# Patient Record
Sex: Male | Born: 1960 | Race: Black or African American | Hispanic: No | Marital: Single | Smoking: Former smoker
Health system: Southern US, Community
[De-identification: ages and names within clinical notes are randomized; demographics above are authoritative.]

## PROBLEM LIST (undated history)

## (undated) DIAGNOSIS — M869 Osteomyelitis, unspecified: Secondary | ICD-10-CM

## (undated) DIAGNOSIS — M7989 Other specified soft tissue disorders: Secondary | ICD-10-CM

## (undated) DIAGNOSIS — I10 Essential (primary) hypertension: Secondary | ICD-10-CM

## (undated) DIAGNOSIS — H539 Unspecified visual disturbance: Secondary | ICD-10-CM

## (undated) DIAGNOSIS — Z5189 Encounter for other specified aftercare: Secondary | ICD-10-CM

## (undated) DIAGNOSIS — E785 Hyperlipidemia, unspecified: Secondary | ICD-10-CM

## (undated) HISTORY — DX: Other specified soft tissue disorders: M79.89

## (undated) HISTORY — PX: EYE SURGERY: SHX253

## (undated) HISTORY — PX: TOE AMPUTATION: SHX809

## (undated) HISTORY — PX: TONSILLECTOMY: SUR1361

## (undated) HISTORY — DX: Unspecified visual disturbance: H53.9

---

## 1979-06-17 HISTORY — PX: NASAL SEPTUM SURGERY: SHX37

## 2003-06-03 ENCOUNTER — Emergency Department (HOSPITAL_COMMUNITY): Admission: EM | Admit: 2003-06-03 | Discharge: 2003-06-04 | Payer: Self-pay | Admitting: Emergency Medicine

## 2004-03-28 ENCOUNTER — Inpatient Hospital Stay (HOSPITAL_COMMUNITY): Admission: EM | Admit: 2004-03-28 | Discharge: 2004-03-31 | Payer: Self-pay | Admitting: Emergency Medicine

## 2004-03-28 ENCOUNTER — Ambulatory Visit: Payer: Self-pay | Admitting: Endocrinology

## 2004-03-29 ENCOUNTER — Encounter: Payer: Self-pay | Admitting: Cardiology

## 2006-11-25 ENCOUNTER — Ambulatory Visit: Payer: Self-pay | Admitting: Family Medicine

## 2006-11-25 ENCOUNTER — Inpatient Hospital Stay (HOSPITAL_COMMUNITY): Admission: EM | Admit: 2006-11-25 | Discharge: 2006-11-28 | Payer: Self-pay | Admitting: Emergency Medicine

## 2006-12-23 ENCOUNTER — Encounter: Payer: Self-pay | Admitting: *Deleted

## 2007-06-17 DIAGNOSIS — Z5189 Encounter for other specified aftercare: Secondary | ICD-10-CM

## 2007-06-17 DIAGNOSIS — IMO0001 Reserved for inherently not codable concepts without codable children: Secondary | ICD-10-CM

## 2007-06-17 HISTORY — DX: Reserved for inherently not codable concepts without codable children: IMO0001

## 2007-06-17 HISTORY — DX: Encounter for other specified aftercare: Z51.89

## 2007-11-15 ENCOUNTER — Inpatient Hospital Stay (HOSPITAL_COMMUNITY): Admission: EM | Admit: 2007-11-15 | Discharge: 2007-12-02 | Payer: Self-pay | Admitting: Emergency Medicine

## 2007-11-15 HISTORY — PX: OTHER SURGICAL HISTORY: SHX169

## 2007-11-16 ENCOUNTER — Encounter (INDEPENDENT_AMBULATORY_CARE_PROVIDER_SITE_OTHER): Payer: Self-pay | Admitting: Orthopedic Surgery

## 2007-11-17 ENCOUNTER — Encounter (INDEPENDENT_AMBULATORY_CARE_PROVIDER_SITE_OTHER): Payer: Self-pay | Admitting: Internal Medicine

## 2007-11-18 ENCOUNTER — Ambulatory Visit: Payer: Self-pay | Admitting: Vascular Surgery

## 2007-11-21 ENCOUNTER — Ambulatory Visit: Payer: Self-pay | Admitting: *Deleted

## 2007-11-22 ENCOUNTER — Ambulatory Visit: Payer: Self-pay | Admitting: Infectious Diseases

## 2007-11-23 ENCOUNTER — Encounter (INDEPENDENT_AMBULATORY_CARE_PROVIDER_SITE_OTHER): Payer: Self-pay | Admitting: Internal Medicine

## 2009-05-31 IMAGING — CR DG ABDOMEN ACUTE W/ 1V CHEST
3 series · 3 of 3 positions shown · non-contrast
Comparison: Chest x-ray of 03/28/04.

CLINICAL DATA: Shortness of breath, vomiting and diarrhea.
 ACUTE ABDOMINAL SERIES WITH CHEST ? 3 VIEW:

[w chest pa]
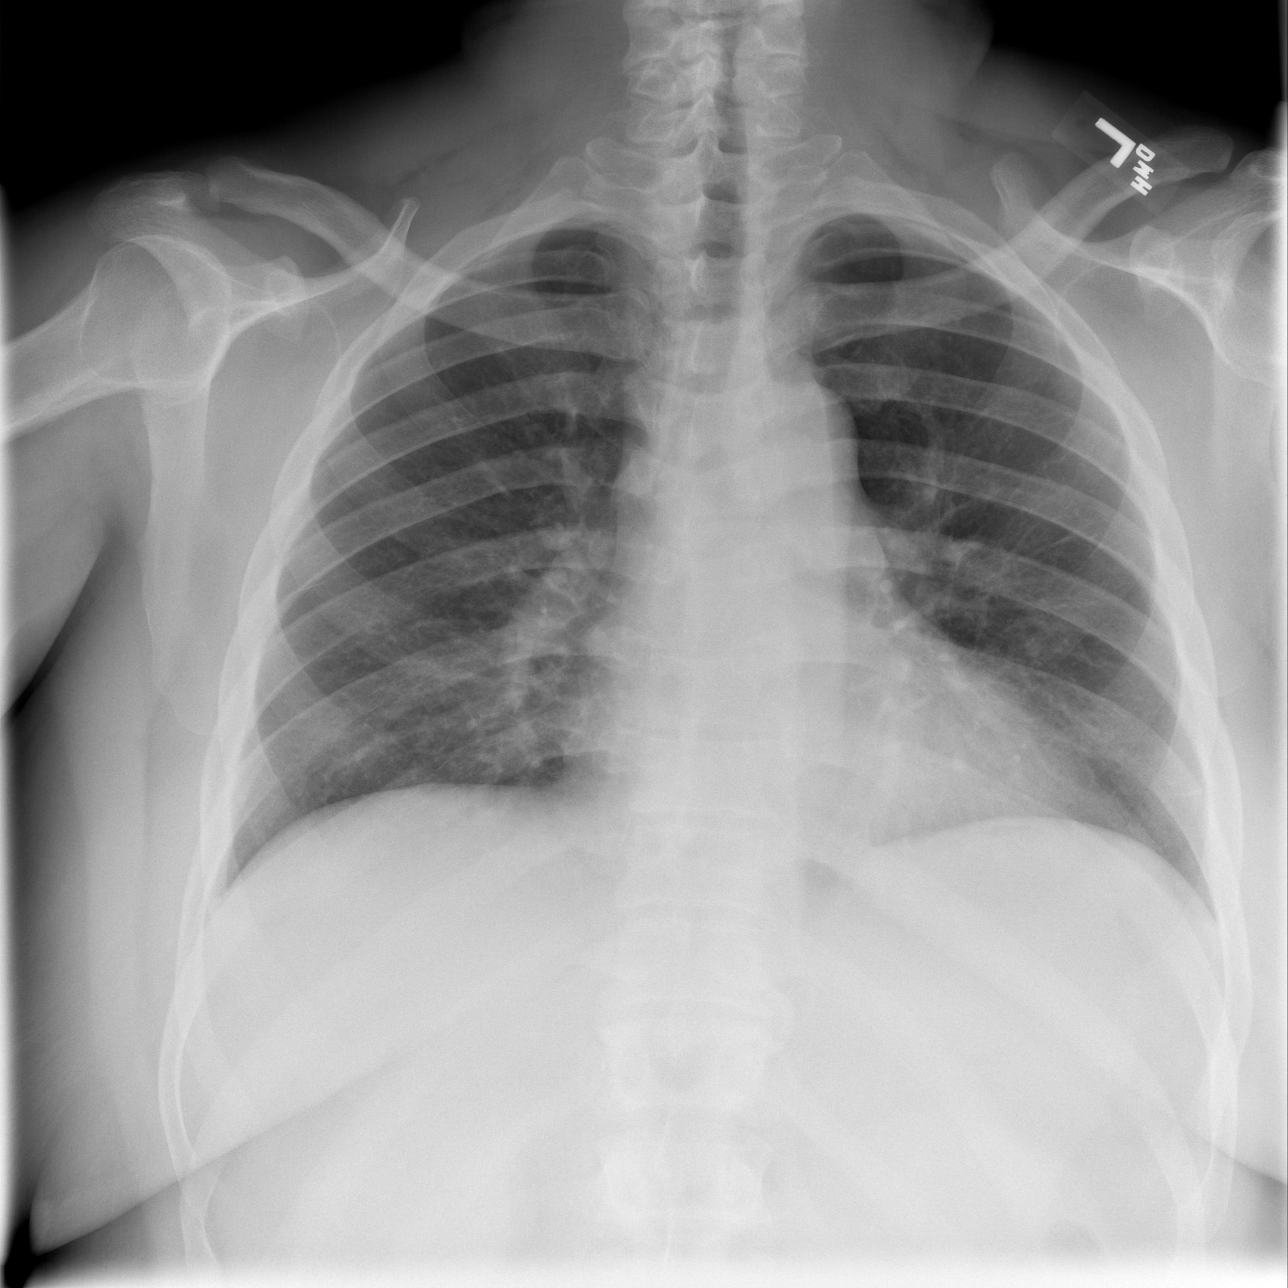

[w abdomen upright]
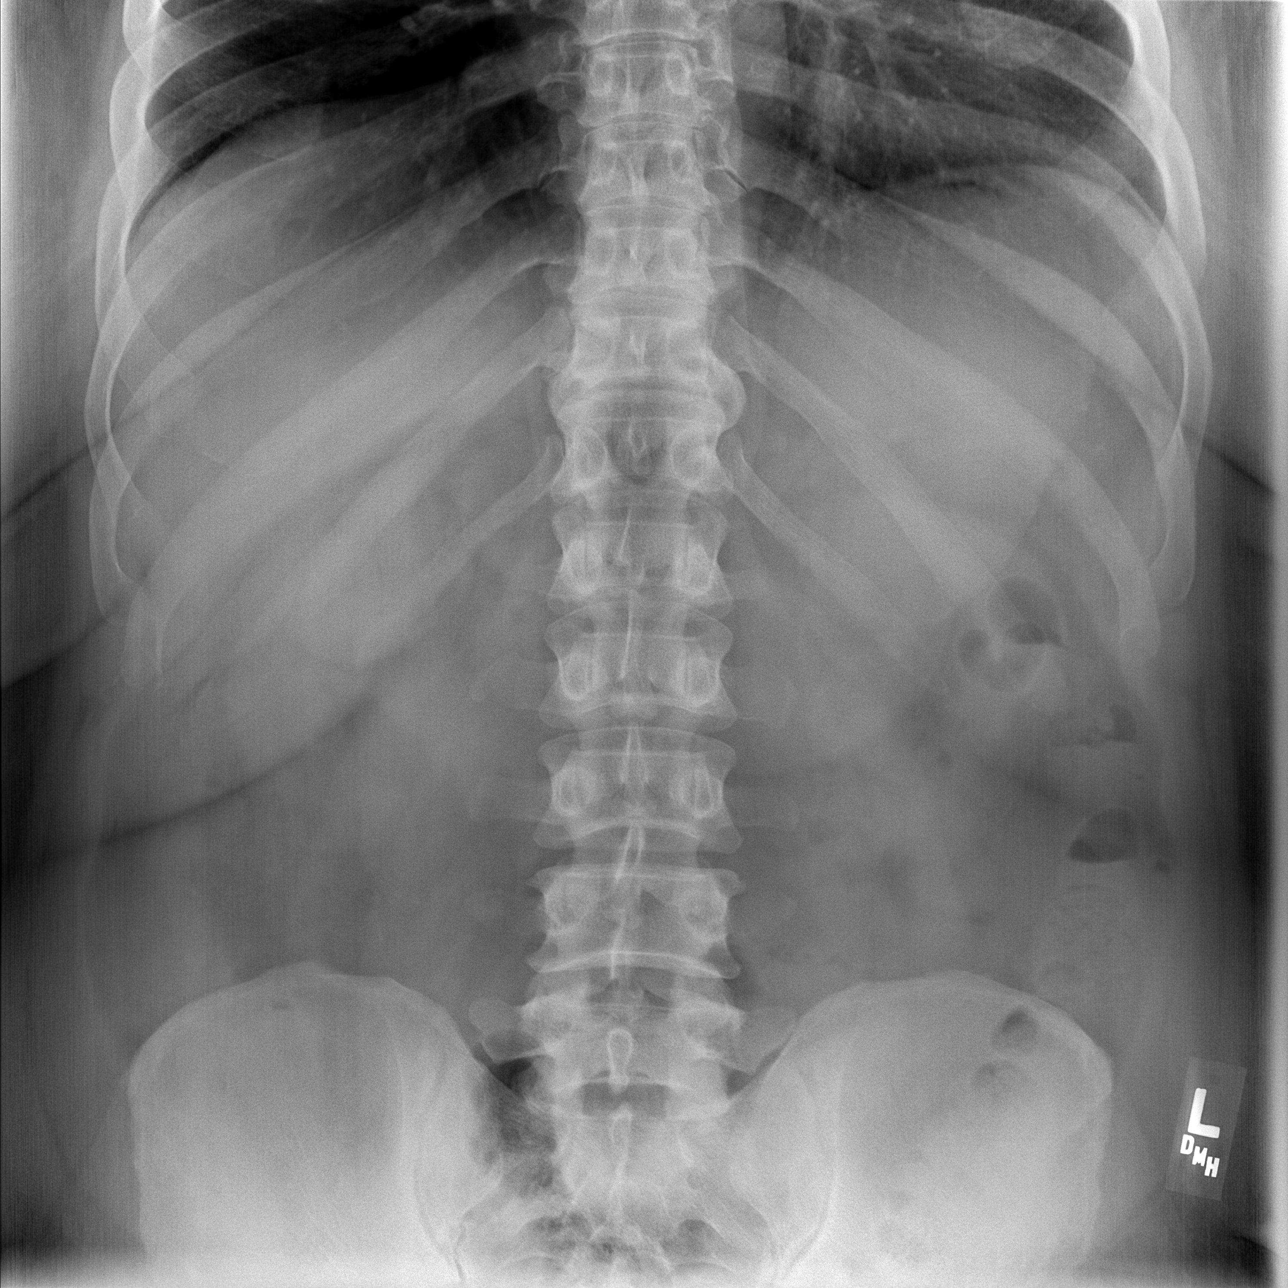

[t abdomen supine]
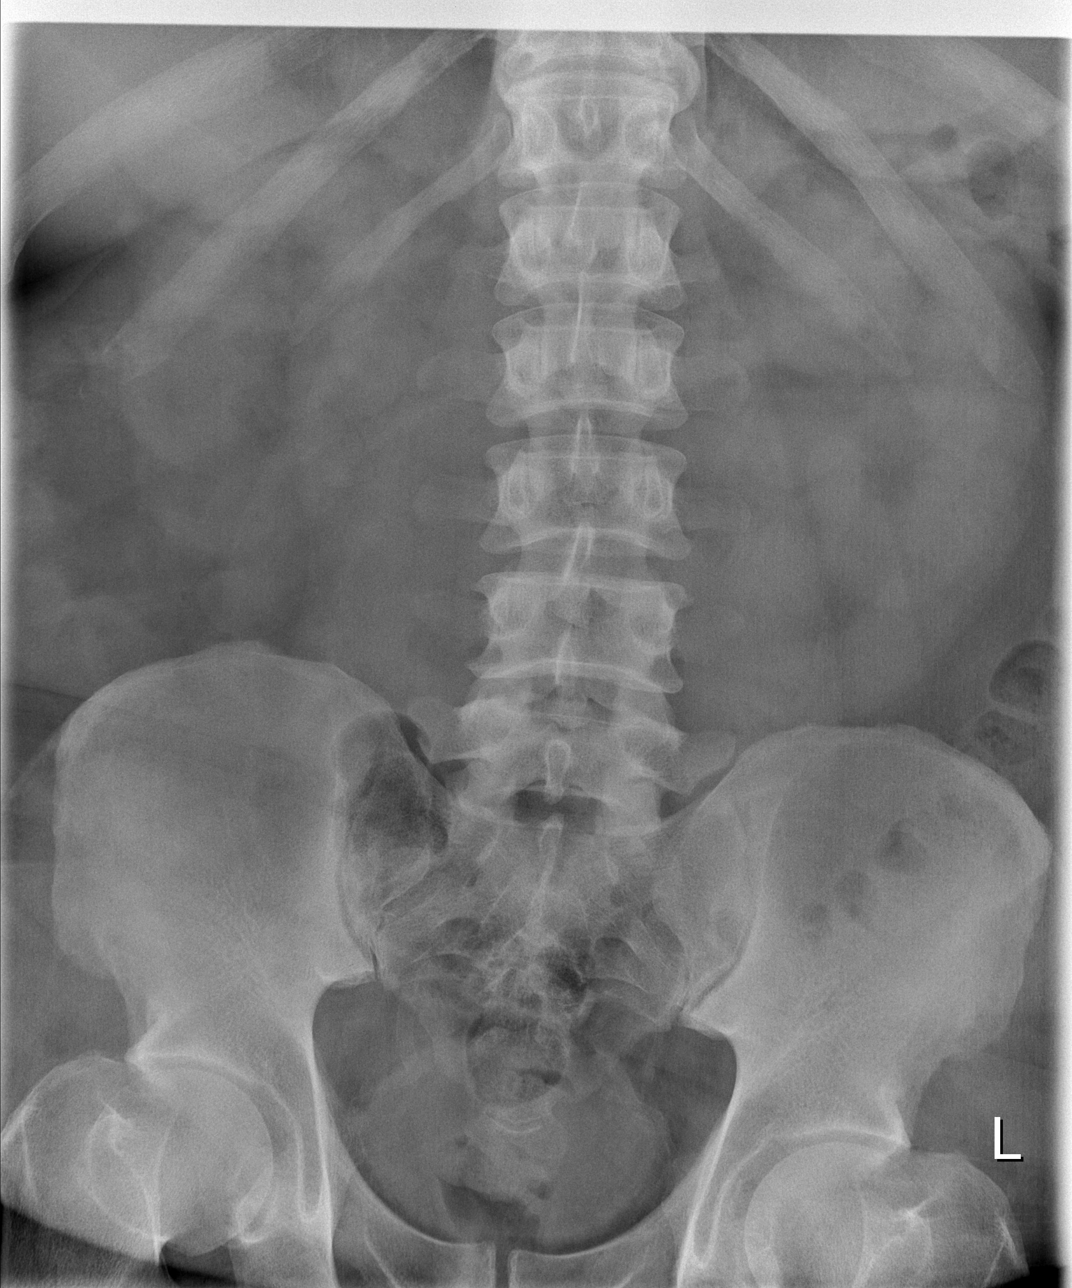

[3 of 3 positions shown; findings below may reference images not displayed]

FINDINGS: Frontal view of the chest shows midline trachea and heart size mildly enlarged, as before.  The lungs are low in volume but clear.  Two views of the abdomen show gas and stool in the colon.  There is a relative paucity of gas in the abdomen.
IMPRESSION: Relative paucity of bowel gas in the abdomen.

## 2009-05-31 IMAGING — US US ABDOMEN COMPLETE
1 series · 13 of 25 positions shown · non-contrast
Comparison: None.

CLINICAL DATA: 46-year-old, nausea and vomiting.    
 ABDOMEN ULTRASOUND:
TECHNIQUE: Complete abdominal ultrasound examination was performed including evaluation of the liver, gallbladder, bile ducts, pancreas, kidneys, spleen, IVC, and abdominal aorta.

[Series 1: unknown · 0.37mm/px · 13 of 68 slices shown]
[im 1/68]
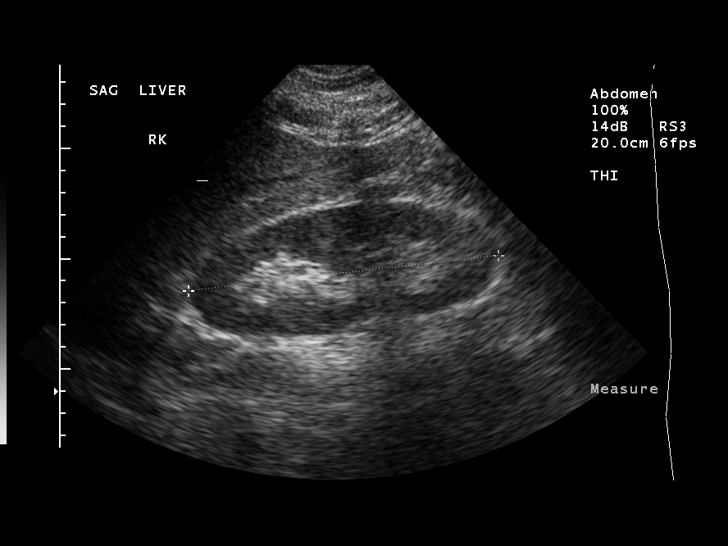
[im 6/68]
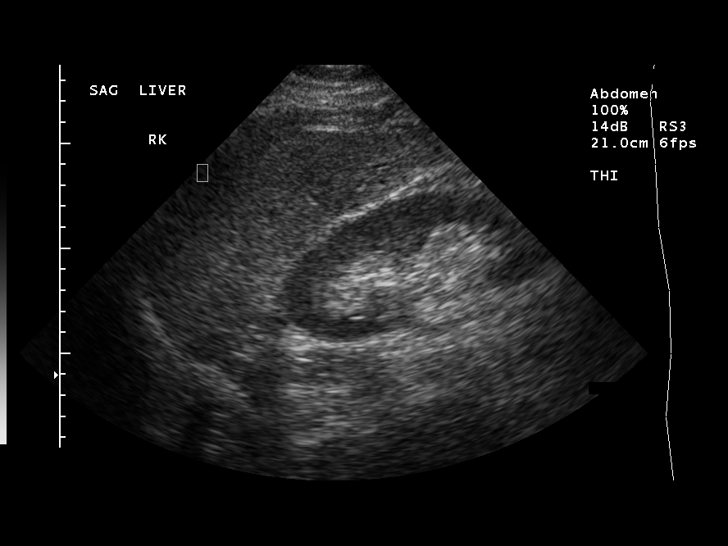
[im 12/68]
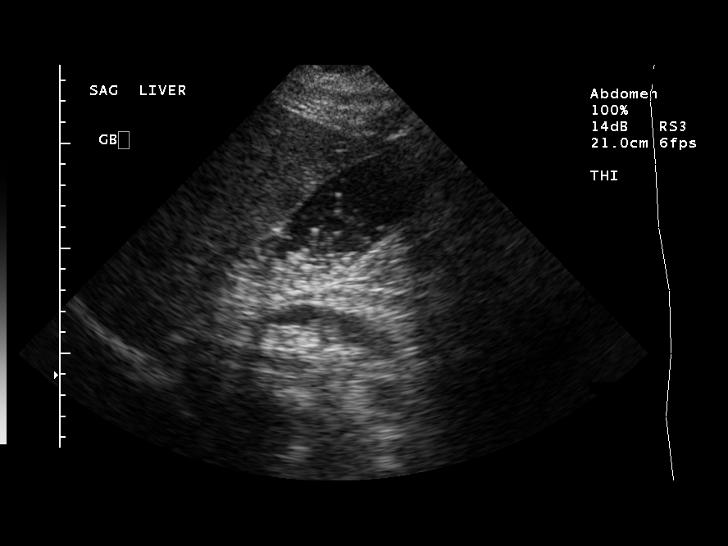
[im 17/68]
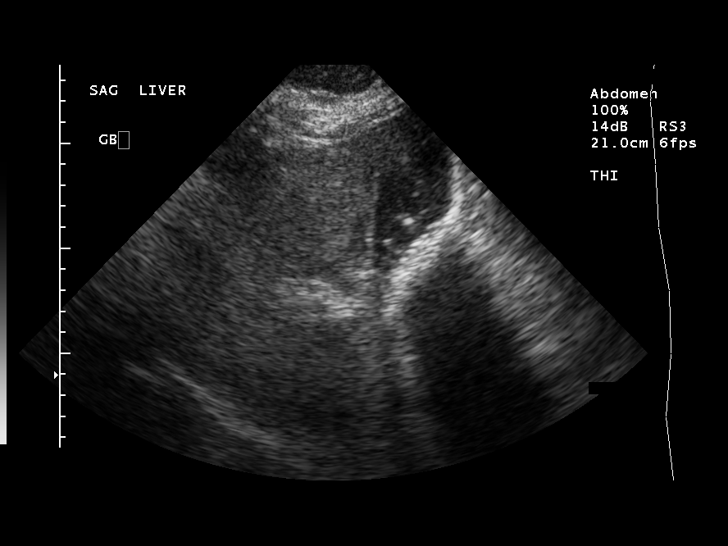
[im 23/68]
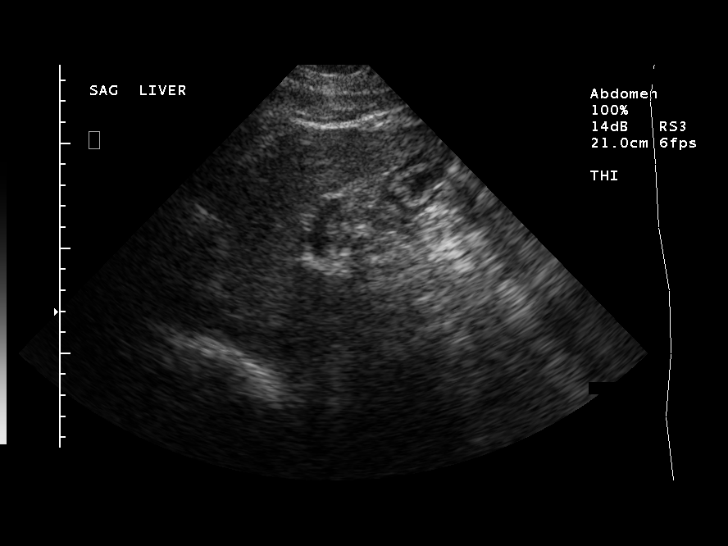
[im 28/68]
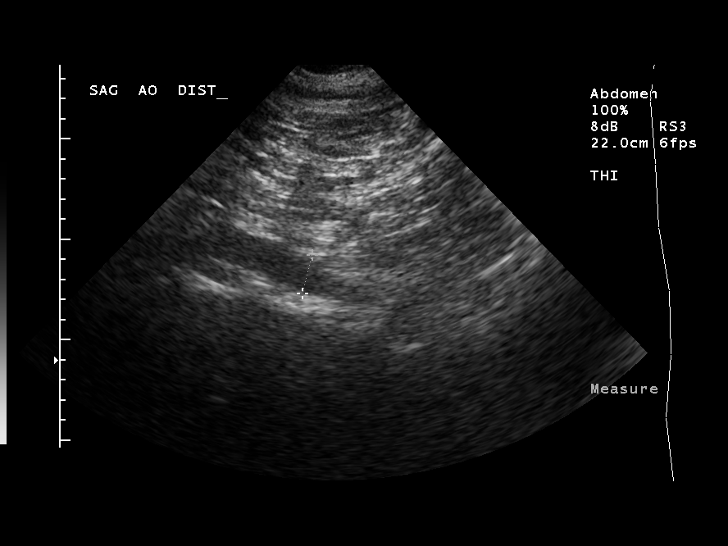
[im 34/68]
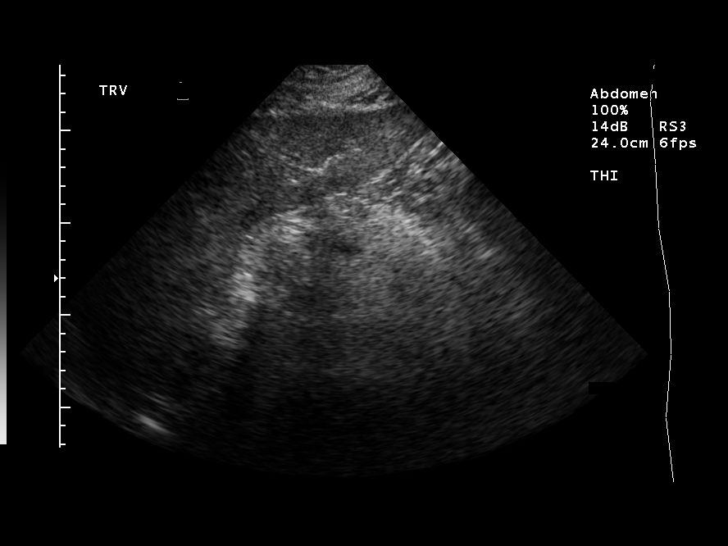
[im 40/68]
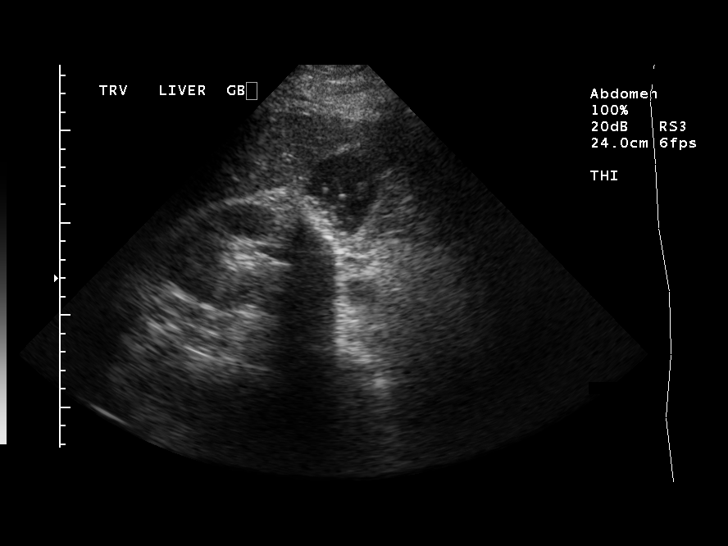
[im 45/68]
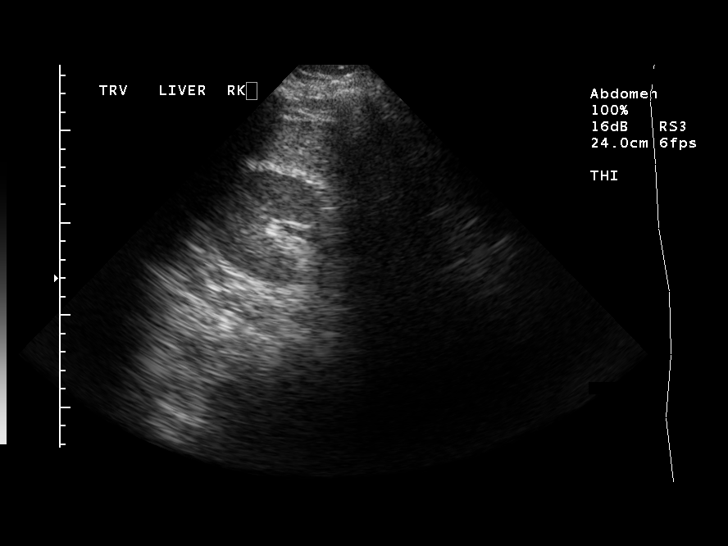
[im 51/68]
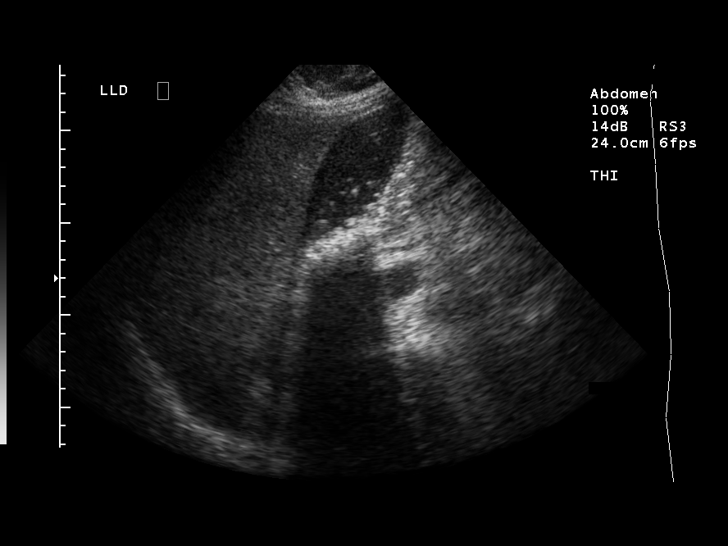
[im 56/68]
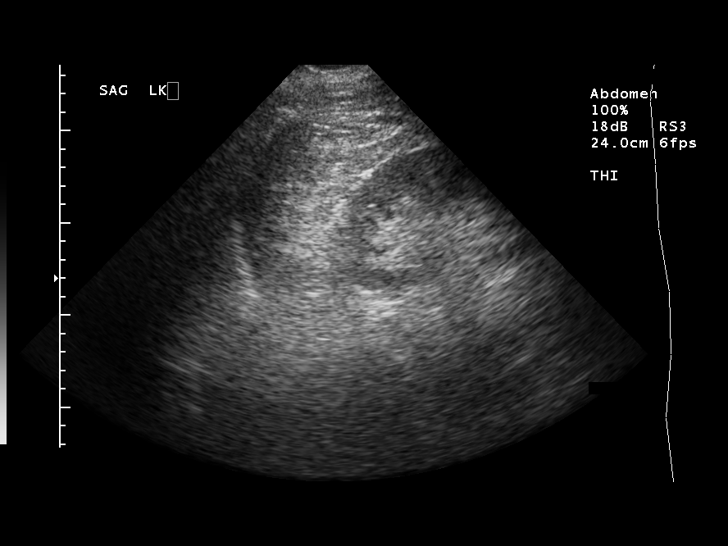
[im 62/68]
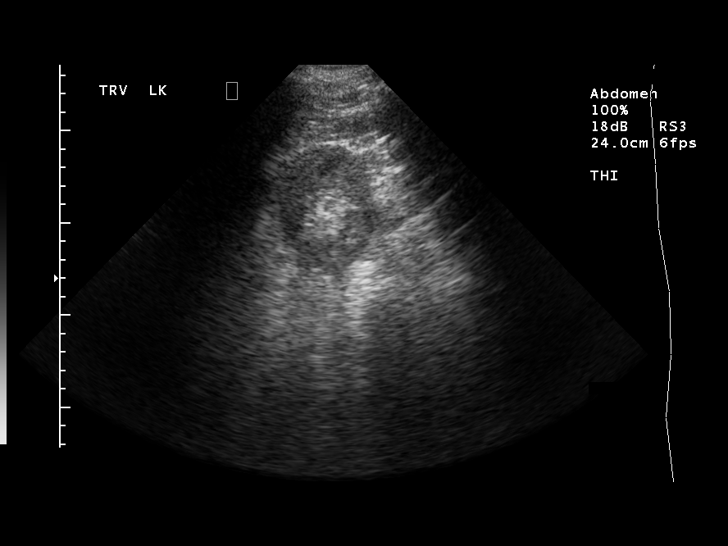
[im 68/68]
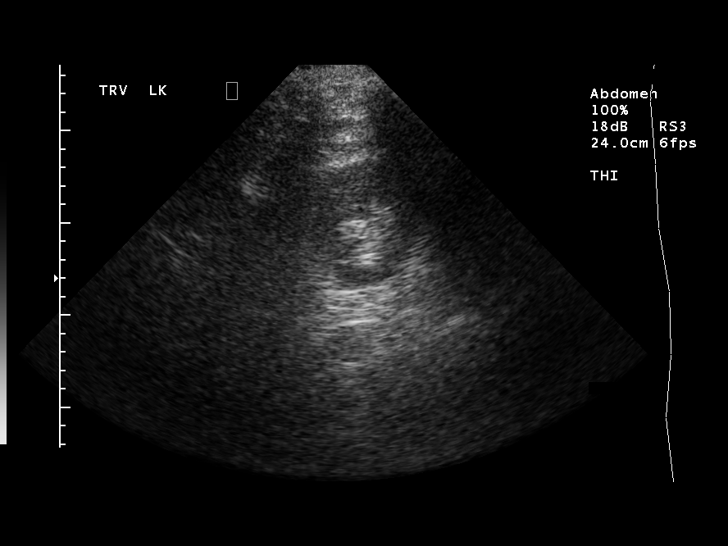

[13 of 25 positions shown; findings below may reference images not displayed]

FINDINGS: The liver is sonographically unremarkable.  No focal hepatic lesions or intrahepatic ductal dilatation.  The common bile duct measures 5.8 mm which is upper limits of normal.  
 Numerous layering echogenic gallstones with acoustic shadowing.  No gallbladder wall thickening or pericholecystic fluid and negative sonographic Murphy?s sign.  The pancreas could not be completely visualized due to overlying bowel gas.  The visualized pancreas is grossly normal.  
 IVC and aorta are normal in caliber. 
 The spleen measures 10.4 cm.   No lesions are seen. The right kidney measures 14.3 cm and the left kidney measures 13.9 cm.  Normal renal echogenicity and renal cortical thickness.  No hydronephrosis or focal lesions.
IMPRESSION: 1.  Cholelithiasis but no sonographic evidence of acute cholecystitis.  
 2.  Limited visualization of the pancreas.
 3.  Common bile duct upper limits of normal in caliber.

## 2009-09-24 ENCOUNTER — Emergency Department (HOSPITAL_COMMUNITY): Admission: EM | Admit: 2009-09-24 | Discharge: 2009-09-24 | Payer: Self-pay | Admitting: Emergency Medicine

## 2010-06-16 HISTORY — PX: AMPUTATION: SHX166

## 2010-10-29 NOTE — Consult Note (Signed)
NAMEKHRIS, Sexton NO.:  [id number]   MEDICAL RECORD NO.:  [id number]          PATIENT TYPE:  INP   LOCATION:  1826                         FACILITY:  [facility]   PHYSICIAN:  Eulas Post, MD    DATE OF BIRTH:  [birth year]   DATE OF CONSULTATION:  11/15/2007  DATE OF DISCHARGE:                                 CONSULTATION   REASON FOR CONSULTATION:  Evaluation of left diabetic foot.   HISTORY:  [name] is a 50 year old gentleman who has type 2  diabetes and has had a chronically draining left foot ulcer on his heel  for at least the past 2 months.  He also has noted drainage and  infection in the left small toe.  He has been somewhat reluctant to seek  medical care until he ultimately was brought in today by his family  members.  He says that he has significant pain in the foot and cannot  bear weight.  There has been significant swelling.  He denies any  subjective fevers or chills.  He locates the pain directly over his  heel.  He has not had any previous treatment for this.   PAST MEDICAL HISTORY:  Significant for type 2 diabetes, as well as  psychotic disorder, and agoraphobia.   FAMILY HISTORY:  Negative for any diabetes according to the patient.   SOCIAL HISTORY:  He is on disability and is a nonsmoker.   PHYSICAL EXAMINATION:  GENERAL:  He is lying on a gurney, in no acute  distress.  NECK:  His neck has a midline trachea and no pain to palpation with full  range of motion of the neck.  CARDIOVASCULAR:  He has significant pedal edema on the left side more so  than on the right side.  He has evidence for venous stasis bilaterally.  He actually has palpable pulses in both lower extremities, although it  took me a while to find the one on the left due to the significant soft  tissue swelling.  RESPIRATORY:  He has no increase in respiratory efforts.  GI:  He is moderately obese and has a soft abdomen with no rebound or  guarding.  LYMPHATIC:  His neck and axillae are without lymphadenopathy.  PSYCHIATRIC:  He seems to understand what I am talking about and his  mood and affect are relatively appropriate for the situation.  NEUROLOGIC:  He has decreased sensation in both lower extremities.  His  right lower extremity has minimal edema with no evidence for ulceration.  His left lower extremity has a 6/5 cm ulceration with exposed heel pad  over the undersurface of the plantar aspect of the calcaneus.  This is  draining pus and there is pus that appears to be tracking up towards the  base of the fifth metatarsal.  There is no clear exposed bone in this  region.  There is also a smaller wound with drainage from the proximal  phalanx of the small toe.  This tracks immediately down to bone.   LABORATORY DATA:  He has a white count of 18  and a blood sugar 224 and  has an x-ray of his left foot, which demonstrates destructive  osteomyelitis in the left fifth toe.  The calcaneus does not have any  clear bony changes, and it is not clear that he has progressed to  osteomyelitis of the hind foot, but I am concerned about the base of the  fifth metatarsal.   IMPRESSION:  1. Diabetic foot ulcer with osteomyelitis of the small toe and      significant soft tissue destruction over the heel pad of the foot.  2. Diabetes mellitus.  3. Psychotic disorder.   PLAN:  [name] is at high risk of losing this foot.  He needs to have  any amputation of his small toe.  He needs to have an aggressive I&D of  his heel pad and the base of the fifth metatarsal in order to have any  hopes of saving his hind foot.  Ultimately, he very certainly has a high  risk of losing the lower extremity; however, I am not proposing that at  this time and we will try to save his foot given his presence of  sensation as well as what appears to be palpable pulses.  Diabetic blood  sugar control is of the utmost importance.  He has had a problem with   compliance in the past, and we will try and do our best to help him.  He  will need to have an I&D of the heel, as well as placement of a wound  vac, and he may certainly need multiple additional surgical debridements  and possibly flap coverage depending on the degree of soft tissue  destruction.  We will start with an I&D and a wound vac tomorrow.  The  risks, benefits, and alternatives were discussed with him at length, and  he is willing to proceed.      Eulas Post, MD  Electronically Signed     JPL/MEDQ  D:  11/15/2007  T:  11/16/2007  Job:  [id number]

## 2010-10-29 NOTE — Op Note (Signed)
NAMELUVERNE, ZERKLE NO.:  [id number]   MEDICAL RECORD NO.:  [id number]          PATIENT TYPE:  INP   LOCATION:  5030                         FACILITY:  [facility]   PHYSICIAN:  [name], MD     DATE OF BIRTH:  1960-07-08   DATE OF PROCEDURE:  11/23/2007  DATE OF DISCHARGE:                               OPERATIVE REPORT   PREOPERATIVE DIAGNOSIS:  Osteomyelitis with Wagner grade 3 ulcer, left  heel calcaneus.   POSTOPERATIVE DIAGNOSIS:  Osteomyelitis with Wagner grade 3 ulcer, left  heel calcaneus.   PROCEDURE:  1. Partial calcaneal excision.  2. Closure of decubitus heel ulcer.   SURGEON:  [name], MD   ANESTHESIA:  Ankle block.   ESTIMATED BLOOD LOSS:  300 mL.   ANTIBIOTICS:  Obtained preoperatively.   DRAINS:  One Penrose drain.   DISPOSITION:  To PACU in stable condition.   INDICATIONS FOR PROCEDURE:  The patient is a 50 year old gentleman with  diabetic insensate neuropathy with a decubitus a left heel ulcer with  osteomyelitis.  He has a extremely large ulcer which is approximately 6  cm in diameter.  The patient has undergone initial irrigation and  debridement with amputation of the little toe and debridement of the  heel ulcer.  He has been started on the wound VAC and presents at this  time for a definitive treatment for the heel ulcer.  Risks and benefits  were discussed including infection, neurovascular injury, persistent  pain, nonhealing of the wound, need for higher-level amputation.  The  patient states he understands and wished proceed at this time.  The  patient was also consulted by [facility] vascular surgery and it was felt that  though he did have peripheral vascular disease, he was not a candidate  for revascularization.   DESCRIPTION OF PROCEDURE:  The patient underwent ankle block in the  holding area and then was brought to OR room 10.  After adequate level  of anesthesia was obtained, the patient's left lower extremity  was  prepped using DuraPrep and draped into a sterile field.  An elliptical  skin incision was made around the ulcer back to bleeding viable  granulation tissue.  The muscle was elevated off the calcaneus and a  partial calcaneal excision was performed.  There was good bleeding  tissue at the base of the wound.  There was no cellulitis, no purulence,  no evidence of any deep infection.  The wound was irrigated with normal  saline.  The incision was closed using a far-near and near-far suture  with 2-0 nylon.  The wound was closed without tension on the skin.  A  Penrose drain was placed deep within the wound.  The wound was covered  with Adaptic, orthopedic sponges, ABD dressing, Webril, and a Unna boot  compressive wrap was applied from his tibial tubercle to his toes.  The  patient was then taken to PACU in stable condition.   PLAN:  For physical therapy, nonweightbearing on the left, and followup  in the office 2 weeks after discharge.  [name], MD  Electronically Signed     MVD/MEDQ  D:  11/23/2007  T:  11/24/2007  Job:  [id number]

## 2010-10-29 NOTE — Consult Note (Signed)
[name], [name]               ACCOUNT NO.:  [id number]   MEDICAL RECORD NO.:  [id number]          PATIENT TYPE:  INP   LOCATION:  5030                         FACILITY:  [facility]   PHYSICIAN:  Di Kindle. Edilia Bo, M.D.DATE OF BIRTH:  June 08, 1961   DATE OF CONSULTATION:  11/18/2007  DATE OF DISCHARGE:                                 CONSULTATION   REASON FOR CONSULTATION:  Extensive wound on the left foot with  peripheral vascular disease.   CONSULTATIONS:  [name], MD   HISTORY:  This is a pleasant 50 year old gentleman who states that  approximately two months ago he had developed a blister on his left  heel.  He tried to peel back some dead skin and developed a wound on the  left heel, which over the last few months has gradually progressed.  He  also developed discoloration of the left fifth toe.  The patient has  type 2 diabetes.  He was admitted on November 15, 2007 with swelling and  discoloration of the left foot, which he had had for two months.  He had  uncontrolled diabetes.  He was consulted by Orthopedics, and on November 16, 2007 underwent a left fifth toe amputation and incision and drainage of  the left heel wound with placement of a VAC.  Subsequently, because of  further wound involving the left heel, Dr. Lajoyce Corners was consulted for more  extensive debridement procedure involving removal of part of the  calcaneus, and Dr. Lajoyce Corners asked [place] to evaluate the patient with respect to  neurovascular status and help predict his chances of healing.   Prior to this admission,  the patient denied any history of  claudication, rest pain, or previous nonhealing wounds.  He has had no  fever or chills that he is aware of.  He has had some paresthesias in  his feet, likely related to his diabetes.   PAST MEDICAL HISTORY:  1. Significant for type 2 diabetes, which was poorly controlled on      admission.  2. One previous episode of atrial fibrillation.  3. Dyslipidemia.  4.  Reportedly, history of psychotic disorder according to his records.      He denies any history of hypertension, hypercholesterolemia,      history of previous myocardial infarction, history of congestive      heart failure, or history of COPD.   PAST SURGICAL HISTORY:  1. Significant for amputation of the left fifth toe and debridement of      the heel as described above.  2. Status post tonsillectomy as a child.  3. Status post repair of a nasal fracture requiring surgery when he      was in his 76s.   MEDICATIONS:  He was on no medications upon admission.   ALLERGIES:  No known drug allergies.   SOCIAL HISTORY:  He is single.  He has one daughter.  He had smoked from  the age of 27 until the age of 26, when he quit.   REVIEW OF SYSTEMS:  GENERAL:  He has had no recent weight loss, weight  gain, problems with his appetite, fever, or chills.  CARDIAC:  He has  had no chest pain, chest pressure, palpitations, or arrhythmias.  PULMONARY:  He has had no recent bronchitis, asthma, or wheezing.  GI:  He has had some problems with diarrhea recently.  He has had no history  of peptic ulcer disease he is aware of.  GU:  He has had no dysuria or  frequency.  VASCULAR:  He has had no claudication or rest pain.  He has  had no history of DVT or phlebitis.  NEURO:  He has had no dizziness,  blackouts, headaches, or seizures.  HEMATOLOGY:  He has had no bleeding  problems or clotting disorders.  ORTHO:  He has had no arthritis or  muscle pain.   PHYSICAL EXAMINATION:  GENERAL:  This is a pleasant 50 year old  gentleman, who appears his stated age.  He has moderate obesity.  VITAL SIGNS:  Temperature is 98.8, heart rate is 96, and blood pressure  133/75.  NECK:  Supple with no cervical lymphadenopathy.  I do not detect any  carotid bruits.  LUNGS:  Clear bilaterally to auscultation.  CARDIAC:  He has a regular rate and rhythm.  ABDOMEN:  Obese and difficult to assess.  I cannot palpate an  aneurysm.  He does have normal-pitched bowel sounds.  EXTREMITIES:  He has palpable femoral pulses, popliteal pulses, and  dorsalis pedis pulses bilaterally.  I cannot palpate a posterior tibial  pulse on the right nor on the left.  He does have some hyperpigmentation  bilaterally consistent with chronic venous insufficiency and some  moderate swelling in the left leg.  I removed his VAC.  He has an  extensive wound involving the lateral aspect of his left heel.  The  tissue appears reasonably well perfused.  He has had a left fifth toe  amputation, which appears to be healing adequately.  With the Doppler,  he has a biphasic dorsalis pedis and posterior tibial signal on the  right.  On the left side, he has a biphasic dorsalis pedis and peroneal  signal with a slightly dampened posterior tibial signal, which is  monophasic on the left.   X-ray of the foot does show a destructive process involving  primarily  the distal and proximal phalanx of the left fifth toe, which has been  amputated.   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  His ankle brachial indices were not  helpful as he has calcific vessels and the ankle brachial indices were  greater than 1.  He did have a left great toe pressure, which was  reported as less than 40.  Based on his exam, he does have evidence of  tibial occlusive disease.  I have explained to him that he clearly has a  limb-threatening situation given the extent of the wound on his left  foot.  I have explained that even with perfectly normal circulation  given the extent of the wound, this may not heal.   PLAN:  More extensive debridement of the heel with removal of part of  the calcaneus and certainly there is risk of nonhealing even if the  circulation is perfectly normal.  However, the patient is quite young  and wishes to have every chance for limb salvage.  For this reason, I  have  recommended we proceed with an arteriogram to see if there are any  options to improve  the circulation to the heel and also help predict  this chance of healing.  I agree with what all is being done currently  including tight control of his CBGs, IV antibiotics including vancomycin  and Zosyn, and use of the [facility] for now.  We will make further  recommendations pending the results of his arteriogram.  Of note, his  creatinine is 1.4, and we will hydrate him gently starting in the  morning.  His arteriogram is planned for the morning.      Di Kindle. Edilia Bo, M.D.  Electronically Signed     CSD/MEDQ  D:  11/18/2007  T:  11/19/2007  Job:  [id number]   cc:   [name], MD

## 2010-10-29 NOTE — Discharge Summary (Signed)
[name], MAZON NO.:  [id number]   MEDICAL RECORD NO.:  [id number]          PATIENT TYPE:  INP   LOCATION:  5030                         FACILITY:  [facility]   PHYSICIAN:  Eduard Clos, MDDATE OF BIRTH:  April 21, 1961   DATE OF ADMISSION:  11/15/2007  DATE OF DISCHARGE:                               DISCHARGE SUMMARY   INTERIM DISCHARGE SUMMARY   COURSE IN THE HOSPITAL:  A 50 year old male with known history of  diabetes mellitus type 2, noncompliant with his medication, presented  with swelling in his left foot.  On admission, the patient had an x-ray  which showed destructive process involving the primary distal, proximal  phalanx of the left fifth toe, consistent with osteomyelitis.  Patient  was admitted to the medical floor, started on IV antibiotics.  Orthopedic consult was obtained.  As per orthopedics, the patient  underwent amputation of his left great toe.  Patient also had an ulcer  on his left heel for which partial calcaneal excision was planned per  orthopedics but wanted a vascular surgery consult first to review his  vascular status for his leg.  Per Dr. Edilia Bo, vascular surgeon, patient  underwent angiogram, which showed left femoropopliteal vertebral artery  and peroneal are well patent.  The posterior tibial occludes above the  ankle, but dorsalis pedis fills the lateral arch with fairly good  collaterals to the heel.  Circulation is not perfect but reasonably good  and no surgical options to improve upon this.   Patient subsequently underwent partial calcaneal excision of his left  heel.  The patient's initial wound cultures grew MSSA and Morganella.  Antibiotics were adjusted per infectious disease.  The patient's  diabetes mellitus was controlled with Lantus insulin at 25 units.  Patient during this stay also had anemia for which 2 units of packed red  blood cells were given.  At this time, his hemoglobin is stable at  around 9.2.   The patient was found to have increasing creatinine.  At  this time, it is around 2.58.  I have consulted nephrologist for this.  Patient is on IV fluids, and the nephrologist feels it may be to  multiple reasons, could be due to contrast.  Could be due to  antibiotics.  At this time, he is recommended to continue with fluids  and follow up his creatinine until it starts showing a decreasing trend.   FINAL DIAGNOSES:  1. Osteomyelitis of the left foot, left small toe, status post      amputation.  2. Status post partial calcaneal excision of the left foot.  3. Diabetes mellitus type 2.  4. Acute renal failure.  5. Anemia, probably from weight loss and chronic kidney disease.  6. History of atrial fibrillation.  7. History of dyslipidemia.   PLAN:  Further instructions and additional medications will be dictated  by the discharging MD at the time of discharge.      Eduard Clos, MD  Electronically Signed     ANK/MEDQ  D:  11/23/2007  T:  11/23/2007  Job:  [id number]

## 2010-10-29 NOTE — Consult Note (Signed)
[name], [name]               ACCOUNT NO.:  [id number]   MEDICAL RECORD NO.:  [id number]          PATIENT TYPE:  INP   LOCATION:  5030                         FACILITY:  [facility]   PHYSICIAN:  Cecille Aver, M.D.DATE OF BIRTH:  1960/10/15   DATE OF CONSULTATION:  DATE OF DISCHARGE:                                 CONSULTATION   REQUESTING PHYSICIAN:  [facility].   REASON FOR CONSULTATION:  Acute renal failure.   HISTORY OF PRESENT ILLNESS:  [name] is a 50 year old black male with  past medical history significant for diabetes mellitus, noncompliant on  no medications, hyperlipidemia and possible psychosis not otherwise  specified.  He presented for admission on November 15, 2007, with a left foot  wound that was getting larger and deeper associated with an older.  He  underwent surgical debridement on November 16, 2007, and then an arteriogram  on November 21, 2007.  He is also status post another operative procedure  today.   Lab review is as follows.   November 16, 2007, creatinine 0.95, November 17, 2007, creatinine 1.19, November 18, 2007, creatinine 1.42, November 19, 2007, creatinine 1.49, November 21, 2007,  creatinine 2.07, November 22, 2007, creatinine 2.43, and November 23, 2007, 2.58.   We are asked to consult.  Urinalysis done early and hospital course was  consistent with proteinuria but no cellular elements.  He has been on a  variety of antibiotics including vancomycin, Zosyn, and Cipro for  osteomyelitis.  He is now on Ancef.  There has not been any NSAIDs, ACEs  or ARBs.  There is no documented significant drop in blood pressure.  The patient does appear to be nonoliguric.   PAST MEDICAL HISTORY:  1. Diabetes mellitus noncompliant.  He believes he has been diagnosed      for 3 years.  2. Hypertension.  3. Obesity.  4. Hyperlipidemia.  5. 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  He was on  1. Aspirin 81 mg a day.  2. Ancef 1 g q. 12.  3.  Insulin.  4. Protonix.  5. Oxycodone.   ALLERGIES:  No known drug allergies.   SOCIAL HISTORY:  The patient is single and has one child.  He lives by  himself.  He admits to remote tobacco use.  He denies any other drug  use.   FAMILY HISTORY:  He has an uncle with ESRD but is unsure of the  etiology.  There is no family history of diabetes.   REVIEW OF SYSTEMS:  Positive for thirst and left foot pain and possible  incomplete bladder emptying.  He denies nausea, vomiting, shortness of  breath, chest pain, edema, diarrhea, constipation, headache.  Otherwise,  review of systems is negative.   PHYSICAL EXAM:  The patient is afebrile.  Blood pressure 137/77, heart  rate is 70, respirations are 20, oxygen saturation is 99% on room air.  Sugars have been good in the low 100s.  Urine output the last three  shifts is 1800 mL, 1150 mL and 900 mL  GENERAL:  The patient is obese, alert and in no acute distress.  HEENT:  Pupils are equal, round, and reactive to light.  Extraocular  muscles are intact.  Mucous membranes are slightly dry.  NECK:  There is no jugular venous distention.  LUNGS:  Clear to auscultation bilaterally without wheezes.  CARDIOVASCULAR:  Regular rate and rhythm.  No murmur, gallop or rub.  ABDOMEN:  Obese, soft, nontender, and nondistended.  EXTREMITIES:  Reveal no appreciable edema.  Left foot is casted, right  foot is dry.  There are no lesions on the right foot.  NEURO:  The patient is alert and oriented.  The neuro exam is nonfocal.   LABS:  White blood count 12.3, hemoglobin 9.2, potassium 3.9, BUN and  creatinine 13 and 2.58, calcium of 8.3.  Tissue culture reveals MSSA, as  well as Morganella which is resistant to Ancef.   ASSESSMENT:  A 50 year old black male with acute renal failure  associated with hospitalization for foot ulcers/osteo.  1. Acute renal failure.  Differential includes contrast      nephropathy/questionable acute interstitial nephritis from       antibiotics/acute tubular necrosis secondary to decreased perfusion      at OR/nephritis.  I will recheck urinalysis and check a renal      ultrasound since the patient is without Foley catheter and      describes incomplete emptying.  I am hoping that good/excellent      urine output is sign of impending recovery.  I would not like the      patient to be discharged since the creatinine is starting to      decrease.  2. Volume:  The patient seems euvolemic to a little bit dry.  Continue      with the intravenous antibiotics and the patient is eating well.      No Lasix is needed at this time.  3. Anemia:  Probably combination of blood loss and chronic kidney      disease.  We will give one dose of Aranesp and check her iron.  4. Disposition.  An important aspect is the patient does need a      primary care physician ([facility]) to follow the patient      at discharge.  Thank you very much for this consultation.  We will      continue to follow with you.           ______________________________  Cecille Aver, M.D.     KAG/MEDQ  D:  11/23/2007  T:  11/24/2007  Job:  [id number]

## 2010-10-29 NOTE — Op Note (Signed)
NAMEHEINZ, ECKERT               ACCOUNT NO.:  [id number]   MEDICAL RECORD NO.:  [id number]          PATIENT TYPE:  INP   LOCATION:  5030                         FACILITY:  [facility]   PHYSICIAN:  Janetta Hora. Fields, MD  DATE OF BIRTH:  1960-07-02   DATE OF PROCEDURE:  DATE OF DISCHARGE:                               OPERATIVE REPORT   PROCEDURE:  Aortogram with left lower extremity runoff.   PREOPERATIVE DIAGNOSIS:  Nonhealing wound, left foot.   POSTOPERATIVE DIAGNOSIS:  Nonhealing wound, left foot.   ANESTHESIA:  Local.   OPERATIVE DETAIL:  After obtaining informed consent, the patient was  taken to the PV lab. The patient was placed in supine position on the  angio table. Right groin was prepped and draped in the usual sterile  fashion. Local anesthesia was infiltrated over the right common femoral  artery. A Majestic needle was used to cannulate the right common femoral  artery and a 0.035 Wholey wire advanced into the abdominal aorta under  fluoroscopic guidance. Next, a 5-French sheath was placed over the  guidewire in the right common femoral artery.  The sheath was thoroughly  flushed with heparinized saline.  A 5-French pigtail catheter was then  placed over the guidewire and advanced into the infrarenal abdominal  aorta.  Abdominal aortogram was then obtained.  This shows widely patent  left and right renal arteries.  The abdominal aorta is widely patent  with no atherosclerotic changes.  The left and right common iliac,  internal iliac, and external iliac arteries are widely patent.  Next, a  5-French crossover catheter was brought up in the operative field.  The  pigtail catheter was pulled back over a guidewire.  The crossover  catheter was then advanced over the guidewire and the left common iliac  artery selectively catheterized.  A 0.035 angled Glidewire was then  brought up in the operative field and advanced through the crossover  catheter down to the level of the  left common femoral artery.  Crossover  catheter was then removed and a 5-French end-hole catheter advanced over  the Glidewire down to the level of the distal external iliac artery.  A  left lower extremity runoff film was then performed.  This shows a  widely patent left external iliac artery, common femoral artery,  profunda femoris artery, and superficial femoral artery.  There is mild  atherosclerotic change of the distal superficial femoral artery with no  flow-limiting stenosis.  Popliteal artery is widely patent.  Origins of  the anterior tibial, posterior tibial, and peroneal arteries are widely  patent.  Peroneal artery has some narrowing at its end segment, but is  patent all the way through its course.  The posterior tibial artery  occludes at the level of the ankle.  The anterior tibial artery is  patent throughout its entire course and is a large dominant vessel.  The  dorsalis pedis artery is widely patent.   Next, a lateral foot view is obtained.  This shows a widely patent  distal anterior tibial artery and dorsalis pedis artery.  The dorsalis  pedis  artery supplies the entire plantar arch including the portion of  the posterior aspect of the foot.  The posterior tibial artery occludes  at the ankle.  The peroneal artery gives off anterior and posterior  communicating branches.  The posterior communicating branch of the  peroneal artery fills the posterior aspect of the foot as well as the  plantar arch from dorsalis pedis flow through the plantar arch.  Lateral  plantar artery completely fills again from the dorsalis pedis and  peroneal branches.   Next, the end-hole catheter was removed over a guidewire.  The 5-French  sheath was left in place to be pulled in the holding area.  The patient  tolerated the procedure well and there were complications.  The patient  was taken the holding area in stable condition.   OPERATIVE FINDINGS:  1. Patent left lower extremity  vessels except for posterior tibial      artery, which has a segmental occlusion of approximately 4 cm at      the level of the ankle.  2. Plantar arch fills via dorsalis pedis and peroneal arteries, which      are both widely patent.      Janetta Hora. Fields, MD  Electronically Signed     CEF/MEDQ  D:  11/21/2007  T:  11/21/2007  Job:  [id number]

## 2010-10-29 NOTE — Discharge Summary (Signed)
NAMEJERAMIE, SCOGIN               ACCOUNT NO.:  [id number]   MEDICAL RECORD NO.:  [id number]          PATIENT TYPE:  INP   LOCATION:  5030                         FACILITY:  [facility]   PHYSICIAN:  Madaline Savage, MD        DATE OF BIRTH:  11-20-1960   DATE OF ADMISSION:  11/15/2007  DATE OF DISCHARGE:  12/02/2007                               DISCHARGE SUMMARY   ADDENDUM:   FINAL DISCHARGE DIAGNOSES:  1. Osteomyelitis of the left foot.  2. Status post partial calcaneal excision of left foot.  3. Diabetes mellitus type 2.  4. Acute renal failure improving.  5. Anemia.  6. History of atrial fibrillation.  7. Dyslipidemia.   DISCHARGE MEDICATIONS:  1. Aspirin 81 mg once daily.  2. Ciprofloxacin 750 mg twice daily until January 09, 2008.  3. Lantus 27 units at bedtime.  4. Sliding scale insulin with NovoLog insulin 3x daily with meals.      Check blood sugars:      a.     For blood sugars 70-100 take 0 units      b.     101-150 take 3 units      c.     151-200 take 4 units.      d.     201-250 take 7 units      e.     251-300 take 9 units      f.     301-350 take 12 units.      g.     For greater than 351 call MD and give 12 units.   HOSPITAL COURSE:  Please see the last discharge summary as dictated by  Dr. Toniann Fail on November 23, 2007 and Dr. Tamsen Roers on November 30, 2007 for a  complete hospital course.   ADDENDUM:  Mr. Levee has been improving from his osteomyelitis of his  left foot.  He also had acute renal failure and his creatinine has  steadily been improving.  At the time of discharge his creatinine is  1.96.   DISPOSITION:  He is now being discharged to a skilled nursing facility.   RECOMMENDATIONS:  1. No weightbearing on the left leg.  2. Dressing changes on his left foot daily.   FOLLOWUP:  He is asked to follow up with Dr. Lajoyce Corners in 2 weeks, and he is  also will be followed by the doctors at the nursing facility.      Madaline Savage, MD  Electronically Signed     PKN/MEDQ  D:  12/02/2007  T:  12/02/2007  Job:  [id number]

## 2010-10-29 NOTE — Discharge Summary (Signed)
NAMEODAY, RIDINGS NO.:  [id number]   MEDICAL RECORD NO.:  [id number]          PATIENT TYPE:  INP   LOCATION:  5030                         FACILITY:  [facility]   PHYSICIAN:  Beckey Rutter, MD  DATE OF BIRTH:  August 11, 1960   DATE OF ADMISSION:  11/15/2007  DATE OF DISCHARGE:                               DISCHARGE SUMMARY   HOSPITAL COURSE:  Please refer to the previously dictated discharge  summary by Dr. Toniann Fail on November 23, 2007.  For the last week I have  been attending to the patient facing mainly 3 problem;  1. Anemia, which he required blood transfusion.  The anemia was felt      secondary to the chronic disease with ongoing osteomyelitis.  We      still monitoring the blood pressure and I suspect the patient might      need further transfusion if he continued to drop his creatinine.  2. Osteomyelitis and cellulitis, remained stable.  Recommendation now      is to continue on ciprofloxacin for 2 days starting yesterday November 29, 2007.  3. Renal function.  Improved slowly over the course of the week.  We      will continue to monitor renal function.  The [facility]phrology and      In[facility] Disease Services where signed off.   The plan now is to discharge the patient to skilled nursing facility and  discharge planning was requested.      Beckey Rutter, MD  Electronically Signed     EME/MEDQ  D:  11/30/2007  T:  12/01/2007  Job:  [id number]

## 2010-10-29 NOTE — H&P (Signed)
NAMEAMEDIO, BOWLBY NO.:  [id number]   MEDICAL RECORD NO.:  [id number]          PATIENT TYPE:   LOCATION:                                 FACILITY:   PHYSICIAN:  Isidor Holts, M.D.       DATE OF BIRTH:   DATE OF ADMISSION:  11/15/2007  DATE OF DISCHARGE:                              HISTORY & PHYSICAL   PRIMARY CARE PHYSICIAN:  Unassigned.   CHIEF COMPLAINT:  Worsening wound left foot, associated with swelling  and discoloration for approximately 2 months.   HISTORY OF PRESENT ILLNESS:  This is a 50 year old male. For past  medical history, see below.  The patient is a known diabetic.  He has  not been on any diabetic medications.  He states that approximately 2  months ago, while in the shower, he noted a loose flap of skin at the  bottom of his left heel.  He does not recollect any antecedent trauma.  He pulled the flap of skin off, and since then, the wound has gotten  progressively larger and deeper, odoriferous, with purulent exudate.  He  sought no treatment, but he placed a piece of rag underneath the left  heel and has been ambulating with it like that.  Today, however, his  sister got really concerned and insisted that he come to the emergency  department.   PAST MEDICAL HISTORY:  1. Type 2 diabetes mellitus.  2. Agoraphobia  3. Previous history of a single episode of atrial fibrillation.  4. Dyslipidemia.  5. 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  6. Cholelithiasis, confirmed by ultrasound scan July 2008.  7. Status post tonsillectomy in childhood.  8. Status post nasal fracture requiring nasal surgery in his 16s.   MEDICATION HISTORY:  Not on any regular medication.   ALLERGIES:  No known drug allergies.   REVIEW OF SYSTEMS:  As per HPI and Chief Complaint.  Denies fever,  denies chills.  Denies abdominal pain, vomiting or diarrhea.  Denies  chest pain or shortness of breath.   SOCIAL  HISTORY:  The patient is single, has one daughter. Ex-smoker,  smoked from age 37 years to 46 years; has not smoked ever since.  Drinks  alcohol only occasionally.  Denies drug abuse   FAMILY HISTORY:  Father died in a house fire. The patient is not  conversant with his medical problems.  Mother died at age 73 years from  breast cancer.  Family history is otherwise noncontributory.   PHYSICAL EXAMINATION:  VITAL SIGNS:  Temperature 100.2, pulse 108 per  minute and regular, respiratory rate 18, blood pressure 130/72 mmHg,  pulse oximeter 94% on room air.  GENERAL:  The patient did not appear to be in obvious acute distress at  time of this evaluation, alert, communicative, not short of breath at  rest.  HEENT:  No clinical pallor or jaundice.  No conjunctival injection.  Throat is clear.  NECK:  Supple.  JVP not seen.  No palpable lymphadenopathy.  No palpable  goiter.  CHEST:  Clinically clear to  auscultation.  No wheezes or crackles.  CARDIAC:  Heart sounds 1 and 2  heard. Normal, regular, no murmurs.  ABDOMEN:.  This is moderately to morbidly obese, soft, nontender.  No  palpable organomegaly or palpable masses.  Normal bowel sounds.  EXTREMITIES:  Lower extremity examination:  Right lower extremity  appears quite unremarkable apart from some hypopigmentation in the  anterior shin.  Peripheral pulses are palpable.  Left lower extremity  examination, however, reveals swollen and edematous left lower extremity  with stasis eczema affecting the lower one-third of the left lower leg.  Peripheral pulses are poor.  The patient has an ulcer, approximately 2  cm in diameter, lateral aspect left fifth toe. Left heel is completely  replaced with a deep stage IV or unstagable ulcer with odoriferous  exudate and a black eschar at the bottom.  There is peripheral puffiness  and tenderness.  Dimensions of this, was approximately 6 x 5 cm.  MUSCULOSKELETAL:  System unremarkable.  CENTRAL NERVOUS  SYSTEM:  No focal neurologic deficit on gross  examination.   INVESTIGATIONS:  CBC:  WBC 13.5, hemoglobin 9.0, hematocrit 27.6,  platelets 412.  Electrolyte:  Sodium 137, potassium 3.5, chloride 102,  CO2 26, BUN 16, creatinine 1.3, glucose 224.  Urine drug screen is  negative.  Urinalysis is negative.   X-ray left foot dated November 15, 2007, shows destructive process involving  primarily distal proximal phalanx of left fifth toe consistent with  osteomyelitis.   ASSESSMENT AND PLAN:  1. Uncontrolled type 2 diabetes mellitus.  We shall institute      carbohydrate-modified diet, sliding scale insulin coverage.  Should      it prove necessary, we shall institute scheduled Lantus insulin in      due course.   1. Left foot cellulitis/osteomyelitis and diabetic ulcers of the left      heel and lateral aspect of the left fifth toe.  We shall institute      local wound care, send blood cultures, commence the patient on      broad-spectrum antibiotic coverage with Vancomycin and Zosyn, and      request an orthopedic consultation for definitive management.   1. History of dyslipidemia.  We shall check TSH and lipid profile.   1. Remote history of atrial fibrillation.  We shall check 12-lead EKG.   Further management will depend on clinical course.      Isidor Holts, M.D.  Electronically Signed     CO/MEDQ  D:  11/15/2007  T:  11/15/2007  Job:  [id number]

## 2010-10-29 NOTE — Op Note (Signed)
[name], [name]               ACCOUNT NO.:  [id number]   MEDICAL RECORD NO.:  [id number]          PATIENT TYPE:  INP   LOCATION:  5030                         FACILITY:  [facility]   PHYSICIAN:  Eulas Post, MD    DATE OF BIRTH:  Sep 16, 1960   DATE OF PROCEDURE:  11/16/2007  DATE OF DISCHARGE:                               OPERATIVE REPORT   PREOPERATIVE DIAGNOSES:  1. Left foot diabetic osteomyelitis of the small toe.  2. Deep infection of the left heel pad and surrounding tendons and      soft tissue.   OPERATIVE PROCEDURE:  1. Left small toe amputation through the metatarsophalangeal joint.  2. Incision irrigation and debridement of soft tissue tendon of the      left heel.  3. Application of wound VAC to the left heel.   Operative cultures were sent with infected tissue x1.   OPERATIVE FINDINGS:  There was deep osteomyelitis involving the small  toe.  This was contained to the proximal and distal phalanx, but not in  the metatarsal.  The heel pad was necrosed and a large area of skin loss  had occurred.  The infection was tracking distally towards the base of  the fifth metatarsal; however, it had not yet invaded the bone either on  the calcaneus or the metatarsal.  The infection had necrosed the soft  tissue of the heel pad down to the level of the fascia as well as  involving the superficial fascia along the lateral border of the  proximal aspect of the fifth metatarsal.   PREOPERATIVE INDICATIONS:  [name] is a 50 year old man who  had basically neglected his foot for a period of about 2 months while he  had a progressive increasing infection that was necrosing his heel pad  and affecting his small toe.  Finally, when the odor of his foot was so  terrible.  His family brought him in the emergency room.  He has  uncontrolled diabetes.  He has multiple other risk factors as well as  some psychiatric conditions.  He elected to undergo the above-named  procedure.  The risks, benefits, and alternatives were discussed with  him preoperatively including but not limited to the risks of recurrent  infection, bleeding, nerve injury, the need for further surgery, the  potential for future below-knee amputation, the spread of osteomyelitis,  the need for a future flap surgery in order to get coverage over this  heel, cardiopulmonary complications, blood clots, among others and he is  willing to proceed.   OPERATIVE PROCEDURE:  The patient was brought to the operating room.  The patient was placed in the supine position.  He was continuing to  receive his ongoing IV antibiotics including vancomycin and Zosyn.  He  also did receive some Ancef.  The left lower extremity was prepped and  draped in the usual sterile fashion.  I did not expose the calcaneal  wound until after I had completed the small toe amputation.  Sharp  incision was made through the small toe at the level of the  metatarsophalangeal  joint and the distal toe was removed.  Appropriate  flaps were elevated.  This wound was closed with nylon.  Copious  irrigation was carried out prior to closure.  Loose closure was  performed.   We then turned our attention to the heel and exposed the heel.  There  was substantial necrotic tissue that was very malodorous and black and  the heel pad was essentially necrosed.  There was substantial soft  tissue loss over the skin.  The infection was tracking distally.  It did  not appear to involve the bone, but did involve all of the soft tissue  of the heel pad.  This was excised sharply and adequate hemostasis was  achieved.  He has actually remarkable amount of bleeding around the  wound.  Healthy tissue over the wound was debrided back until we reached  healthy tissue and then the wound was irrigated with pulse lavage.  A  total of 6 L was irrigated through the wound.  We then applied a wound  VAC after cutting the appropriate size bone strip.   The patient was  awakened and returned to the PACU in stable and satisfactory condition  after all the appropriate dressings were applied.  Excellent suction to  the wound VAC was confirmed prior to leaving the operating room.  The  patient tolerated the procedure well.  There were no complications.  He  will need multiple additional wound VAC changes and certainly may need  flap coverage and/or additional vascular studies in order to assess the  healing and the potential health and viability of his lower extremity.      Eulas Post, MD  Electronically Signed     JPL/MEDQ  D:  11/16/2007  T:  11/17/2007  Job:  [id number]

## 2010-10-29 NOTE — Consult Note (Signed)
[name], [name] NO.:  [id number]   MEDICAL RECORD NO.:  [id number]          PATIENT TYPE:  INP   LOCATION:  5504                         FACILITY:  [facility]   PHYSICIAN:  [name], M.D.  DATE OF BIRTH:  17-Oct-1960   DATE OF CONSULTATION:  11/27/2006  DATE OF DISCHARGE:  11/28/2006                                 CONSULTATION   REQUESTING PHYSICIAN:  Ms. [name].   REASON FOR CONSULTATION:  Rule out psychosis and assess capacity for  informed consent.  Recommend treatment.   HISTORY OF PRESENT ILLNESS:  [name] is a 50 year old male  admitted to the [facility] on the November 25, 2006 with vomiting.  He  stated that he had been not eating or drinking for several days.  He was  vague about his reasons.  He denied that he was trying to hurt himself.  He would not discuss the subject further.  On his questioning with the  general medical team, they had thought that it might be potentially  related to his religion.   Please see the mental status exam.  [name] is not combative.  He has  restarted his p.o. intake.  He denies any hallucinations.  He denies any  thoughts of harming himself or others and he has no agitation or  combativeness.  He has no depressed mood.   PAST PSYCHIATRIC HISTORY:  The patient has a history of being on  disability due to anxiety.  He will not be specific about the detail  symptoms and his history; however, he stated that he was on disability  for a fear of crowds.  The general medical team mentioned a possible  history of schizophrenia.   [name] had another episode of not eating for several days in October  2005.  [name] acknowledged that he had tried fasting at that time as  well as this time in order to resolve something; however, he will not  state what it is.  When asked is at about his religion, he states you  could say that.  He acknowledges having seen a psychiatrist in the  past; however,  he will not acknowledge any psychotropic medications or  other treatment.   He does state that after having tried his non p.o. process a few times  that you can tell the other doctors that I am not going to try this  fasting approach again.   A past history of schizophrenia is mentioned in the October 2005 record;  however, the details are not described.   FAMILY PSYCHIATRIC HISTORY:  None known.   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  He used to drink  an occasional beer.  He denies any illegal drugs.  He resides alone.  He  used to smoke tobacco.   He told the admitting physician that he drinks about twelve 20 ounce  malt liquors per month.   GENERAL MEDICAL PROBLEMS:  1. Diabetes mellitus.  2. Obesity.  3. History of an atrial fibrillation episode in a previous  hospitalization.   MEDICATIONS:  The MAR is reviewed.  The patient is not on any  psychotropic medications.   He has no known drug allergies.   LABORATORY DATA:  His basic metabolic panel shows a glucose of 224,  otherwise unremarkable.   WBC 8.1, hemoglobin 12.6, platelet count 224.  Hepatic function panel is  unremarkable.  TSH unremarkable.  Hemoglobin A1c high at 10.4.   REVIEW OF SYSTEMS:  The patient is only partially cooperative.  CONSTITUTIONAL:  Afebrile.  HEAD:  No trauma.  EYES:  No visual changes.  EARS:  No hearing impairment.  NOSE:  No rhinorrhea.  MOUTH/THROAT:  No  sore throat.  NEUROLOGIC:  Unremarkable.  PSYCHIATRIC:  As above.  CARDIOVASCULAR:  No chest pain, palpitations, edema.  RESPIRATORY:  No  coughing or wheezing.  GASTROINTESTINAL:  As above.  GENITOURINARY:  No  dysuria.  SKIN:  Unremarkable.  MUSCULOSKELETAL:  No deformities.  ENDOCRINE/METABOLIC:  Unremarkable.  HEMATOLOGY/LYMPHATIC:  Unremarkable.   EXAMINATION:  VITAL SIGNS:  Temperature 98.6, pulse 100, respirations  18, blood pressure 122/76, O2 saturation on room air 99%.   MENTAL STATUS EXAM:  Mr.  Sexton is a middle-aged male lying in a supine  position in his hospital bed with intermittent eye contact.  He is  alert.  His affect is slightly anxious at times and then mostly flat.  His mood is grossly within normal limits other than some slight feeling  on edge.  He is oriented to all spheres.  His memory is grossly intact  to immediate, recent and remote.  His fund of knowledge and intelligence  are grossly within normal limits.  His speech is impoverished but  involves normal articulation without dysarthria.  There is also normal  prosody in the limited phrases that he provides.  Thought process is  coherent.  Thought content:  He will provide no specific delusional  material.  There are no hallucinations evident.  He has no thoughts of  harming himself or others.  He is a guarded historian.  There appears to  be a vague irrational component as to why he fasted; however, as  mentioned above, he will not provide enough information to confirm this.  He is noncombative.  His insight is undetermined.  His judgment is not  assessed by the mental status exam due to the limited information that  he provides.  He is well-groomed.   ASSESSMENT:  AXIS I:  1. 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  The patient      reports a history of fear of crowds.  He may have a delusional      history or he may have schizo-type traits on Axis II.  2. Rule out 293.81, psychotic disorder not otherwise specified with      delusions.  As discussed, the patient does not provide a clear      criteria that he is delusional.  AXIS II:  Deferred.  AXIS III:  See general medical problems.  AXIS IV:  Primary support group, general medical.  AXIS V:  55 new.   As mentioned above, due to the patient's resistance to interview, his  judgment based upon a cross-sectional exam is difficult to assess.  However, if the patient continues to demonstrate by his behavior on the  ward that he can self sustain  activities of daily living and nutrition  then he will be demonstrating intact judgment for living outside of an  institution, which of course will involve eating and  drinking on a  regular basis.   RECOMMENDATIONS:  If the patient changes his mind about wanting to talk  more with psychiatry over his worries and issues or if he presents any  commitment criteria in thought, behavioral or mood, please call  psychiatry back.      [name], M.D.  Electronically Signed     JW/MEDQ  D:  11/29/2006  T:  11/30/2006  Job:  [id number]

## 2010-10-29 NOTE — H&P (Signed)
[name], [name] NO.:  [id number]   MEDICAL RECORD NO.:  [id number]          PATIENT TYPE:  INP   LOCATION:  1824                         FACILITY:  [facility]   PHYSICIAN:  Angeline Slim, M.D.  DATE OF BIRTH:  1961-01-31   DATE OF ADMISSION:  11/25/2006  DATE OF DISCHARGE:                              HISTORY & PHYSICAL   CHIEF COMPLAINT:  Vomiting.   HISTORY OF PRESENT ILLNESS:  A 50 year old African-American male with  history of diabetes mellitus, obesity presented to the emergency  department today with vomiting x4 to 5 days that was worse today with  some blood streaks noted and heme-positive emesis.  He has not eaten or  drinking anything for 9 days and without being prompted states that this  was not him trying to hurt himself.  He does not want to discuss the  specific reason that he decided to stop eating, however.  On further  questioning, it seems to be potentially related to religion ?Marland Kitchen  His last  discharge summary is from 2005.  He has a history of major depressive  disorder and a ?  Schizophrenia.  He is on disability apparently for  what he calls fear of crowds.  He has had some sore throat episodes  especially during vomiting.  He has also had some intermittent hiccups.  He had some episodes of diarrhea before his 9-day episode without  food/water, and his last bowel movement was today, and it was fairly  loose.  He denies any fever.  He did have a few, intermittent episodes  of dyspnea associated with burping during the last couple of days but  currently has no difficulty breathing.  He denies any cough.  He does  have some mild burning chest pain shortly after vomiting, denies  dysuria/syncope/numbness/weakness/rash.  He does have some intermittent  abdominal pain that is mild.   REVIEW OF SYSTEMS:  See the HPI but otherwise normal.   PAST MEDICAL HISTORY:  1. Diabetes mellitus.  2. Agoraphobia.  3. ?  Schizophrenia per prior H&P.  4.  Obesity.  5. Major depressive disorder.  6. History of an AFib episode with previous hospitalization.   FAMILY HISTORY:  Mom died of breast cancer at 1, has a daughter but  does not know her medical history.  Father is an alcoholic but died in a  house fire.  Sister has lupus, obesity.   SOCIAL HISTORY:  The patient lives alone.  No tobacco or drug use  currently but has a history of tobacco use x10 years in the distant  past.  The patient drinks about 12 twenty-ounce malt liquors per month.  The patient does not work secondary to a mental disability, which he  attributes to agoraphobia.   MEDICATIONS:  None.   ALLERGIES:  No known drug allergies.   PHYSICAL EXAMINATION:  VITAL SIGNS:  Temperature 97.5, sat 99% on room  air, pulse 109 to 112, blood pressure 115 to 148/70 to 79, respirations  19 to 20.  GENERAL:  In no acute distress.  Obese African-American male.  HEENT:  External eyes/ears/lips  appear normal.  The patient has a  positive deviated septum.  NECK:  No lymphadenopathy/thyromegaly noted.  CARDIOVASCULAR:  He was tachycardic but regular rhythm.  PULMONARY:  Clear to auscultation bilaterally.  ABDOMEN:  Soft, nontender/normoactive bowel sounds.  NEURO:  Cranial nerves II-XII intact.  Sensations are grossly normal.  The patient is ambulatory.  EXTREMITIES:  Show anterior shin scarring.  No active  lesions/edema/cyanosis.  SKIN:  Shows no rash.   EKG shows sinus tachycardia with PACs and diffuse flipped T waves.  V/Q  scan shows low probability for PE.  Abdominal ultrasound shows  cholelithiasis but no cholecystitis, acute abdomen.  X-ray shows  positivity of bowel gas except for the distal colon.  D-dimer is 5.37.  Gastric blood was positive.  Lipase was 83.  Sodium 143, potassium 3.8,  bicarb 22, chloride 102, BUN 41, creatinine 2.1.  Glucose 335, total  bili 1.6, alk phos 75, ALT 15, AST 16, total protein 8.1, albumin 3.8,  calcium 9.6.  White count 16.0,  hemoglobin 15.3, platelets 313.  ANC is  13.9.   ASSESSMENT AND PLAN:  A 50 year old African-American male with  dehydration secondary to poor p.o. intake and acute renal failure with  baseline creatinine of 1.2 up to 2.1 and history of psych issues.  1. Dehydration:  We will hydrate with normal saline 150 mL/h.  This is      also the likely cause of his tachycardia and his acute renal      failure.  With his psych history, we will discuss whether or not to      call a psych consult, as his behavior is worrisome though.  He      adamantly denies any suicide or ideation.  2. Acute renal failure:  As above, we will follow and hydrate.      Suspect this is prerenal.  Will check an FeNa as stated based on      creatinine of 1.3 up to 2.10 this admission.  3. Diabetes mellitus:  We will check CBG, to also check A1c.  He has      been completely off his medications for quite some time.  We will      probably need to start p.o. meds before discharge, but let us see      how his sugars and A1c run before determining the regimen.  Also,      we need to see if he has any chronic renal insufficiency before      determining what the medicines we will put him on.  4. Psychiatric:  Does not seem to be a threat to himself or others,      but obviously, this behavior is very troubling.  We will follow and      discuss with the team.  Consider psychiatric consult but probably      will not be warranted.  5. Dyspnea:  There is of unknown etiology, currently asymptomatic,      difficult to assess.  He was negative for V/Q scan for pulmonary      embolus.  We will rule out myocardial infarction though, since the      patient is diabetic and prone to atypical presentation.  6. Increased white count:  I suspect this is secondary to stress      effect.  The patient does not have any fever.  We will check      urinalysis and urine culture and watch for other signs of  infection.  I do not think it is an  issue at this time.  7. Elevated D-dimer:  The V/Q scan was negative and has no symptoms      for deep vein thrombosis.  We will discuss whether or not to check      Dopplers, but at this time, he seems at very low risk and would opt      to not.  8. Deep vein thrombosis prophylaxis:  We will put the patient on      Lovenox 40 mg.  9. Stomach Prophylaxis:  The patient will have Protonix 40 mg.  10.Vomiting/blood streak emesis:  Suspect this may have been secondary      to his prolonged poor intake with recent restarting of his p.o.      food and drink.  For now, we will just treat with antiemetics and      follow for worsening symptoms.  I do expect that this will improve      on its own.  11.Cholelithiasis:  This does not seem to be causing his acute issues      since he does not have any abdominal pain, although vomiting is      associated with it.  He may be need a cholecystectomy in the future      if he demonstrates consistent symptoms.  This also may be a cause      of his small increase in total bilirubin.  12.Pancreatitis:  The patient just has a very mild increase in his      lipase with no abdominal pain.  The patient will be getting IV      fluids for his dehydration anyway, but this time has no abdominal      pain, and no further treatment will be needed.      Angeline Slim, M.D.     AL/MEDQ  D:  11/25/2006  T:  11/26/2006  Job:  [id number]

## 2010-11-01 NOTE — H&P (Signed)
[name], [name]               ACCOUNT NO.:  [id number]   MEDICAL RECORD NO.:  [id number]          PATIENT TYPE:  INP   LOCATION:  1825                         FACILITY:  [facility]   PHYSICIAN:  Reather Littler, M.D.       DATE OF BIRTH:  1960-10-11   DATE OF ADMISSION:  03/28/2004  DATE OF DISCHARGE:                                HISTORY & PHYSICAL   CHIEF COMPLAINT:  Vomiting.   HISTORY:  This is a 50 year old African-American diabetic male who presented  to the emergency room with nausea and vomiting for about four days.  The  patient was last seen in my office in June and had cancelled his followup  appointment last month.  For the last four days or so, he has been having  nausea and vomiting without any associated abdominal pain or diarrhea.  He  has not had any fever.  He does also complain of steady retrosternal chest  pain but cannot describe it further.  The patient was apparently not eating  or drinking anything for the last two weeks or so he claims.  He states that  he has not be able to like anything to eat and has not bee drinking water  also.   The patient has had fair diabetes control when last seen in the office, and  his Glucotrol was reduced from 20 to 10 mg.  The patient did not recall his  recent blood sugars.   CURRENT MEDICATIONS:  1.  Glucotrol XL 10.  2.  Metformin extended release 1000 mg.  3.  Actos 30 mg.  4.  Zestoretic 10/12.5 mg daily.   PAST MEDICAL HISTORY:  1.  Apparently there is a past history of schizophrenia.  2.  History of fracture of nose.   REVIEW OF SYSTEMS:  He had diabetes diagnosed last year in December.  He  does have occasional sharp pains in his feet and legs.  He has a history of  leg swelling which has been somewhat better.  He has had mild hypertension.  He has not had any recent weight gain and over the last year has lost about  15 pounds from diet and exercise.  He denies any history of recent  depression or suicidal  thinking.   FAMILY HISTORY:  Negative for diabetes only.  Hypertension present.   SOCIAL HISTORY:  He is a nonsmoker.  He has occasional beer.   PHYSICAL EXAMINATION:  GENERAL:  The patient is alert and cooperative.  Pulse 112.  Blood pressure 90/70.  HEENT:  Eyes: Pupils are equal.  Mucous membranes appear moist.  NECK:  Exam is normal with no lymphadenopathy or thyroid enlargement.  CARDIAC:  Heart sounds normal.  LUNGS:  Clear.  ABDOMEN:  No distention present.  There is no tenderness or mass present.  RECTAL:  Exam deferred.  EXTREMITIES:  Normal ankle reflexes.  There is no edema.  He has some status  pigmentation of his feet.   LABORATORY DATA:  Significant for blood sugar of 329 and potassium of 2.8  as well increased creatinine.   IMPRESSION:  The patient has dehydration and hypokalemia.  His hypokalemia  may be related to continued [place] of antihypertensives.   Currently he does not appear to have any acute gastrointestinal problem.  His white count is increased, but liver functions are normal, and he is  afebrile.   PLAN:  1.  Hydrate him and replace potassium.  2.  Start Lantus insulin for now.  3.  Will monitor his potassium.  4.  He will need psychiatry consultation again.  5.  Currently because of his hypokalemia, we will monitor him on telemetry.  6.  He will also have blood sugar checks at least 4 times a day and blood      pressure frequently monitored.       AK/MEDQ  D:  03/28/2004  T:  03/28/2004  Job:  [id number]

## 2010-11-01 NOTE — Discharge Summary (Signed)
[name], [name]               ACCOUNT NO.:  [id number]   MEDICAL RECORD NO.:  [id number]          PATIENT TYPE:  INP   LOCATION:  2902                         FACILITY:  [facility]   PHYSICIAN:  Reather Littler, M.D.       DATE OF BIRTH:  01-Jan-1961   DATE OF ADMISSION:  03/28/2004  DATE OF DISCHARGE:                                 DISCHARGE SUMMARY   HISTORY:  Patient was admitted with vomiting and also chest discomfort.  Patient had also not been eating much for about two weeks and also not  taking any of his medications.  Patient does not have any adequate  explanation of why he was doing this.  However, patient later on admitted  that he was self-inducing the vomiting.   MEDICATIONS ON ADMISSION:  1.  Glucotrol.  2.  Metformin.  3.  Actos.  4.  Lisinopril/HCT.   PHYSICAL EXAMINATION:  VITAL SIGNS:  Blood pressure 90/70, pulse 112,  regular.  Examination otherwise unremarkable.   HOSPITAL COURSE:  Patient's blood sugar was 329 and potassium 2.8.  Patient  was hydrated with saline and potassium given in IV boluses and in the IV.  Patient was given Reglan to control nausea.  With this the patient improved  but on admission patient started having SVT, atrial flutter and fibrillation  which was unresponsive to initial Cardizem.  Dr. Samule Ohm added digoxin IV and  his rate was back in sinus rhythm.  He was also switched to oral calcium  blocker and echocardiogram was ordered.   His heart rate and blood pressure stabilized and he was also continued on  heparin prophylaxis.  Potassium continued to be low and was replaced.  He  was switched from Lantus to his Glucotrol and Actos again, although blood  sugar did stay around 200 until the time of discharge.   Psychiatric consultation was ordered and Dr. Jeanie Sewer felt that he had some  __________ in the axis 2, but no definite diagnosis established and no  specific treatments were advised, especially as patient refused to consider  this.   He was recommended to see [facility] or reconsidering  treatment and monitoring because of his previous history of major depressive  disorder.   DISCHARGE DIAGNOSES:  1.  Severe hypokalemia secondary to poor dietary intake and vomiting.  2.  Rapid atrial flutter and fibrillation of uncertain etiology.  3.  Mild hypotension.  4.  Uncontrolled diabetes.  5.  Major depressive disorder with mild manifestation currently.   CONDITION ON DISCHARGE:  Improved.   RECOMMENDATIONS:  Restart his Glipizide, Actos, and Fortamet.  To start  Cardizem CD 360 mg daily and take potassium 20 mEq daily until seen in the  office in four or five days.   LABORATORIES:  Cardiac enzymes were normal.  White blood cells initially  15.1 and repeat 10.9.  INR 1.6.  Potassium 2.8 and repeat 3.3 the next day.  On discharge it was 3.4.  Creatinine was 2.3 on admission and follow-up down  to 1.5 and 1.3.  TSH normal.  Chest x-ray showed mild cardiomegaly without  CHF.  Echocardiogram reportedly had grossly normal LV, but poor window.   FOLLOW-UP INSTRUCTIONS:  To be seen in the office in four to five days and  with Dr. Samule Ohm as scheduled.       AK/MEDQ  D:  03/31/2004  T:  03/31/2004  Job:  [id number]   cc:   Salvadore Farber, M.D. [facility]  [address]  [address]  [place]  [place] [address]   Antonietta Breach, M.D.

## 2010-11-01 NOTE — Consult Note (Signed)
NAMEDEMANI, MCBRIEN               ACCOUNT NO.:  [id number]   MEDICAL RECORD NO.:  [id number]          PATIENT TYPE:  INP   LOCATION:  2902                         FACILITY:  [facility]   PHYSICIAN:  Salvadore Farber, M.D. LHCDATE OF BIRTH:  [birth year]   DATE OF CONSULTATION:  03/28/2004  DATE OF DISCHARGE:                                   CONSULTATION   REQUESTING PHYSICIAN:  Dr. Lucianne Muss   REASON FOR CONSULTATION:  Requested by Dr. Lucianne Muss to consult regarding chest  discomfort and tachycardia.   HISTORY OF PRESENT ILLNESS:  Mr. Carlile is a 50 year old gentleman with  diabetes and hypertension who presented today with complaints of four days  of nausea and vomiting.  He claims that he has not had anything to eat or  drink for two weeks.  On evaluation by Dr. Lucianne Muss he was found to have a  potassium of 2.8, a creatinine of 2.3 (baseline unknown), and rapid atrial  flutter which has since degenerated into rapid atrial fibrillation.  Patient  complains of constant substernal chest discomfort x4 days.  He denies any  dyspnea, palpitations, radiation of the discomfort.   PAST MEDICAL HISTORY:  1.  Diabetes mellitus.  2.  Hypertension.  3.  History of a nose fracture.   ALLERGIES:  NKDA.   MEDICATIONS:  Patient says he was not taking any medications at home.  He is  currently receiving potassium replacement and Diltiazem IV at 10 mg/hour.   SOCIAL HISTORY:  Patient lives alone in [place].  He formerly was a part-  time boxer.  He has had disability due to psychiatric issues for more than a  decade.  Denies tobacco use.  Occasionally drinks alcohol heavily, most  recently 10 days ago.  Uses marijuana occasionally.   FAMILY HISTORY:  Details not available.  Mother appears to have  hypertension.   REVIEW OF SYSTEMS:  Notable for episode of what sounds like bullae  developing on bilateral shins approximately four months ago.  He tells me  these developed shortly after starting these  new medications.  They resolved  spontaneously.  Complains of ongoing anxiety as well as nausea and chest  discomfort.  Otherwise, negative in detail except as above.   PHYSICAL EXAMINATION:  GENERAL:  He is a mildly ill-appearing gentleman.  VITAL SIGNS:  Pulse of approximately 100 and irregular despite a monitor  ventricular rate of 160 beats per minute.  Blood pressure is 114/67,  temperature 99.3.  NECK:  His jugular venous pressure is not visible.  There is no thyromegaly  and no lymphadenopathy.  LUNGS:  Clear to auscultation.  HEART:  His point of maximal cardiac impulse is not palpable.  Chest is  nontender.  He has a tachycardic and irregularly irregular rhythm without  murmur.  ABDOMEN:  Mildly obese, soft, nondistended, nontender.  There is no  hepatosplenomegaly.  Bowel sounds are normal.  EXTREMITIES:  Warm without clubbing, cyanosis, edema, or ulceration.  There  are scars on anterior portion of the shins which have healed.  No  ulcerations.  NEUROLOGIC:  He is alert and oriented  x3 with cranial nerves II-XII grossly  intact and sensation and strength normal in all four extremities.   Chest x-ray demonstrates cardiomegaly without congestive heart failure.   Electrocardiogram initially demonstrated atrial flutter with 2:1 AV block  with ventricular rate of 169 beats per minute.  There were minor nonspecific  ST-T abnormalities.  By monitor he was subsequently converted to atrial  fibrillation with ventricular rate of approximately 160 beats per minute.   Laboratory studies remarkable for WBC 15, hematocrit 43, platelets 288.  Creatinine 2.3, glucose 325, potassium 2.8, sodium 132.  AST 18, ALT 16.  INR 1.6, troponin I less than 0.05, CK-MB 2.4.   IMPRESSION/RECOMMENDATION:  A 50 year old gentleman with psychiatric illness  presents with dehydration, renal insufficiency of unknown duration, chest  discomfort, and rapid atrial flutter which has since degenerated into  rapid  atrial fibrillation.  1.  Chest pain:  Multiple risk factors for coronary disease.  However,      negative troponin despite report of four days of constant chest      discomfort is reassuring as his lack of obvious ischemia on EKG.  Will      check serial cardiac enzymes.  His inability to manifest a significant      pulse with each ventricular contraction raises concern about ventricular      systolic function which in turn raises question of coronary disease in      this man with multiple risk factors.  2.  Atrial fibrillation with rapid ventricular response:  Will work on rate      control with digoxin and calcium channel blocker.  Digoxin dose will be      adjusted for his renal dysfunction.  Will check TSH and echocardiogram.      May need to add beta blocker in addition to the calcium channel blocker.  3.  Hypokalemia:  Being repleted by primary team.  4.  Renal insufficiency:  Per primary team.  5.  Diabetes:  Per primary team.  6.  Elevated INR:  Etiology not clear.  Will recheck in morning.       WED/MEDQ  D:  03/28/2004  T:  03/29/2004  Job:  [id number]

## 2010-11-01 NOTE — Discharge Summary (Signed)
Sexton, Dan               ACCOUNT NO.:  [id number]   MEDICAL RECORD NO.:  [id number]          PATIENT TYPE:  INP   LOCATION:  5504                         FACILITY:  [facility]   PHYSICIAN:  [name], Dan SextonDATE OF BIRTH:  04-19-1961   DATE OF ADMISSION:  11/25/2006  DATE OF DISCHARGE:  11/28/2006                               DISCHARGE SUMMARY   CONSULTATIONS:  Antonietta Breach, M.D. with psychiatry.   DISCHARGE DIAGNOSES:  1. Dehydration.  2. Diabetes.  3. Hyperlipidemia.  4. Acute renal failure.  5. 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   DISCHARGE MEDICATIONS:  1. Metformin 500 mg p.o. b.i.d.  2. Zocor 40 mg p.o. q.h.s.   FOLLOWUP:  With Dr. Angeline Slim at the [facility], phone  # [phone], July 9 at 11 a.m.   BRIEF HISTORY OF PRESENT ILLNESS:  This is a 50 year old African  American male with history of diabetes and obesity who presented to the  emergency department with vomiting for the past 4-5 days.  He had  noticed some blood streaks and heme-positive emesis.  He had not eaten  or drank anything for the past 9 days and was without reason for this.  He did not want to discuss why he stopped eating, though questionably  related to religion.  Please see the history and physical for full  details of this.  He was found to be dehydrated and in acute renal  failure.  He was admitted to the [facility].   HOSPITAL COURSE:  1. Acute renal failure.  He was felt to be prerenal due to dehydration      as his FeNa was less than 1.  His creatinine improved from 2.1 on      admission down to 1.29 at discharge.  2. Vomiting.  This resolved with supportive measures and antiemetics.      Suspect this may have been due to his underlying psychiatric      disorder.  The patient was without any further hematemesis and this      was felt due to just vomiting.  3. Diabetes.  The patient is not  on any medications.  His hemoglobin      A1c was elevated at 10.4 on admission.  He was started on Metformin      500 mg p.o. b.i.d.  4. Hyperlipidemia.  The patient's fasting lipid panel was checked.      His triglycerides were 81.  HDL was 29 and his LDL was 142.  He was      started on Zocor 40 mg at bedtime with the goal being less than      100.  5. Psychiatric.  The patient's not eating was felt to be due to      psychiatric issues.  Dr. Jeanie Sewer was consulted.  The patient did      not volunteer much information for Dr. Jeanie Sewer.  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  The patient was encouraged to followup with      psychiatry.  He wishes to discuss further.  6. Dyspnea.  The patient had some mild dyspnea and shortness of breath      on admission.  His vital signs were stable.  He had a positive D-      Dimer with negative VQ scan.  Cardiac enzymes were negative x3.      His dyspnea and chest pain resolved quickly following admission.   IMAGING STUDIES:  Abdominal ultrasound showed cholelithiasis but no  sonographic evidence of acute cholecystitis.  Acute abdominal series  showed a paucity of bowel gas in the abdomen.  VQ scan was low  probability for pulmonary embolus.   LABORATORY DATA:  Creatinine on admission 2.1, creatinine at discharge  1.29, hemoglobin A1c 10.4, lipid panel triglycerides 81, HDL 29, LDL  142.  BUN less than 1.  Hemoglobin at discharge 12.6.   FOLLOWUP:  As stated above with Dr. Angeline Slim at the [facility] on December 23, 2006.      [name], M.D.  Electronically Signed      [name], M.D.  Electronically Signed    MR/MEDQ  D:  12/31/2006  T:  01/01/2007  Job:  [id number]

## 2011-03-13 LAB — CBC
HCT: 22.9 — ABNORMAL LOW
HCT: 23.2 — ABNORMAL LOW
HCT: 24.9 — ABNORMAL LOW
HCT: 25.2 — ABNORMAL LOW
HCT: 26.3 — ABNORMAL LOW
HCT: 27.3 — ABNORMAL LOW
HCT: 27.6 — ABNORMAL LOW
HCT: 27.8 — ABNORMAL LOW
HCT: 30.1 — ABNORMAL LOW
HCT: 26.9 — ABNORMAL LOW
HCT: 28.2 — ABNORMAL LOW
Hemoglobin: 7.7 — CL
Hemoglobin: 7.7 — CL
Hemoglobin: 8.1 — ABNORMAL LOW
Hemoglobin: 8.1 — ABNORMAL LOW
Hemoglobin: 8.2 — ABNORMAL LOW
Hemoglobin: 8.2 — ABNORMAL LOW
Hemoglobin: 8.6 — ABNORMAL LOW
Hemoglobin: 9 — ABNORMAL LOW
Hemoglobin: 9.2 — ABNORMAL LOW
Hemoglobin: 9.2 — ABNORMAL LOW
Hemoglobin: 9.3 — ABNORMAL LOW
MCHC: 32.3
MCHC: 32.4
MCHC: 32.5
MCHC: 32.8
MCHC: 33.1
MCHC: 33.1
MCHC: 33.1
MCHC: 32.9
MCHC: 33.1
MCV: 74.7 — ABNORMAL LOW
MCV: 75.4 — ABNORMAL LOW
MCV: 76.3 — ABNORMAL LOW
MCV: 76.7 — ABNORMAL LOW
MCV: 77 — ABNORMAL LOW
MCV: 77.9 — ABNORMAL LOW
MCV: 78.7
MCV: 78.8
MCV: 79
Platelets: 330
Platelets: 338
Platelets: 339
Platelets: 341
Platelets: 352
Platelets: 354
Platelets: 412 — ABNORMAL HIGH
Platelets: 380
Platelets: 407 — ABNORMAL HIGH
Platelets: 410 — ABNORMAL HIGH
RBC: 3.1 — ABNORMAL LOW
RBC: 3.11 — ABNORMAL LOW
RBC: 3.19 — ABNORMAL LOW
RBC: 3.35 — ABNORMAL LOW
RBC: 3.57 — ABNORMAL LOW
RBC: 3.63 — ABNORMAL LOW
RBC: 3.63 — ABNORMAL LOW
RBC: 3.7 — ABNORMAL LOW
RBC: 3.58 — ABNORMAL LOW
RDW: 13.4
RDW: 14
RDW: 14.4
RDW: 16.6 — ABNORMAL HIGH
WBC: 11.2 — ABNORMAL HIGH
WBC: 11.3 — ABNORMAL HIGH
WBC: 11.3 — ABNORMAL HIGH
WBC: 11.4 — ABNORMAL HIGH
WBC: 11.5 — ABNORMAL HIGH
WBC: 13.4 — ABNORMAL HIGH
WBC: 13.5 — ABNORMAL HIGH
WBC: 13.8 — ABNORMAL HIGH
WBC: 13.9 — ABNORMAL HIGH
WBC: 15.1 — ABNORMAL HIGH
WBC: 11.7 — ABNORMAL HIGH
WBC: 13.5 — ABNORMAL HIGH
WBC: 13.6 — ABNORMAL HIGH

## 2011-03-13 LAB — BASIC METABOLIC PANEL
BUN: 10
BUN: 11
BUN: 12
BUN: 22
BUN: 9
BUN: 17
BUN: 17
CO2: 23
CO2: 24
CO2: 25
CO2: 25
CO2: 26
CO2: 26
CO2: 26
CO2: 26
CO2: 27
CO2: 26
CO2: 27
Calcium: 7.8 — ABNORMAL LOW
Calcium: 8.1 — ABNORMAL LOW
Calcium: 8.1 — ABNORMAL LOW
Calcium: 8.2 — ABNORMAL LOW
Calcium: 8.3 — ABNORMAL LOW
Calcium: 8.3 — ABNORMAL LOW
Calcium: 8.4
Calcium: 8.2 — ABNORMAL LOW
Calcium: 8.5
Chloride: 102
Chloride: 103
Chloride: 103
Chloride: 103
Chloride: 105
Chloride: 110
Chloride: 104
Chloride: 107
Creatinine, Ser: 0.95
Creatinine, Ser: 1.4
Creatinine, Ser: 1.49
Creatinine, Ser: 2.07 — ABNORMAL HIGH
Creatinine, Ser: 2.34 — ABNORMAL HIGH
Creatinine, Ser: 2.43 — ABNORMAL HIGH
Creatinine, Ser: 1.96 — ABNORMAL HIGH
Creatinine, Ser: 2.02 — ABNORMAL HIGH
Creatinine, Ser: 2.09 — ABNORMAL HIGH
GFR calc Af Amer: 32 — ABNORMAL LOW
GFR calc Af Amer: 32 — ABNORMAL LOW
GFR calc Af Amer: 33 — ABNORMAL LOW
GFR calc Af Amer: 35 — ABNORMAL LOW
GFR calc Af Amer: >60
GFR calc Af Amer: >60
GFR calc Af Amer: 44 — ABNORMAL LOW
GFR calc non Af Amer: 26 — ABNORMAL LOW
GFR calc non Af Amer: 29 — ABNORMAL LOW
GFR calc non Af Amer: 53 — ABNORMAL LOW
GFR calc non Af Amer: >60
GFR calc non Af Amer: >60
Glucose, Bld: 142 — ABNORMAL HIGH
Glucose, Bld: 175 — ABNORMAL HIGH
Glucose, Bld: 190 — ABNORMAL HIGH
Glucose, Bld: 200 — ABNORMAL HIGH
Potassium: 3.2 — ABNORMAL LOW
Potassium: 3.3 — ABNORMAL LOW
Potassium: 3.4 — ABNORMAL LOW
Potassium: 3.6
Potassium: 3.8
Potassium: 3.9
Potassium: 3.9
Potassium: 3.1 — ABNORMAL LOW
Sodium: 133 — ABNORMAL LOW
Sodium: 137
Sodium: 138
Sodium: 138
Sodium: 139
Sodium: 141
Sodium: 141
Sodium: 137

## 2011-03-13 LAB — TISSUE CULTURE

## 2011-03-13 LAB — RENAL FUNCTION PANEL
CO2: 26
CO2: 27
CO2: 26
Calcium: 7.8 — ABNORMAL LOW
Calcium: 8.4
Chloride: 102
Chloride: 107
Chloride: 107
Creatinine, Ser: 2.34 — ABNORMAL HIGH
GFR calc Af Amer: 35 — ABNORMAL LOW
GFR calc Af Amer: 41 — ABNORMAL LOW
GFR calc non Af Amer: 29 — ABNORMAL LOW
GFR calc non Af Amer: 30 — ABNORMAL LOW
GFR calc non Af Amer: 34 — ABNORMAL LOW
Glucose, Bld: 126 — ABNORMAL HIGH
Glucose, Bld: 91
Potassium: 3.4 — ABNORMAL LOW
Sodium: 140
Sodium: 140

## 2011-03-13 LAB — TSH: TSH: 0.972

## 2011-03-13 LAB — CROSSMATCH: ABO/RH(D): AB POS

## 2011-03-13 LAB — RAPID URINE DRUG SCREEN, HOSP PERFORMED
Amphetamines: NOT DETECTED
Barbiturates: NOT DETECTED
Benzodiazepines: NOT DETECTED
Cocaine: NOT DETECTED
Opiates: NOT DETECTED
Tetrahydrocannabinol: NOT DETECTED

## 2011-03-13 LAB — LIPID PANEL
Cholesterol: 120
HDL: 25 — ABNORMAL LOW
LDL Cholesterol: 86
Total CHOL/HDL Ratio: 4.8
Triglycerides: 46
VLDL: 9

## 2011-03-13 LAB — CULTURE, BLOOD (ROUTINE X 2)
Culture: NO GROWTH
Culture: NO GROWTH

## 2011-03-13 LAB — PROTEIN ELECTROPHORESIS, SERUM
Albumin ELP: 35 — ABNORMAL LOW
Alpha-1-Globulin: 7.3 — ABNORMAL HIGH
Gamma Globulin: 27.6 — ABNORMAL HIGH

## 2011-03-13 LAB — PROTEIN / CREATININE RATIO, URINE: Creatinine, Urine: 30.4

## 2011-03-13 LAB — DIFFERENTIAL
Basophils Absolute: 0
Basophils Relative: 0
Eosinophils Absolute: 0.3
Eosinophils Relative: 2
Lymphocytes Relative: 10 — ABNORMAL LOW
Lymphs Abs: 1.4
Monocytes Absolute: 1.1 — ABNORMAL HIGH
Monocytes Relative: 8
Neutro Abs: 10.7 — ABNORMAL HIGH
Neutrophils Relative %: 79 — ABNORMAL HIGH

## 2011-03-13 LAB — IRON AND TIBC
Iron: 29 — ABNORMAL LOW
Saturation Ratios: 9 — ABNORMAL LOW
TIBC: 129 — ABNORMAL LOW
TIBC: 168 — ABNORMAL LOW
UIBC: 139

## 2011-03-13 LAB — POCT I-STAT, CHEM 8
BUN: 16
Calcium, Ion: 1.18
Chloride: 102
Creatinine, Ser: 1.3
Glucose, Bld: 224 — ABNORMAL HIGH
HCT: 30 — ABNORMAL LOW
Hemoglobin: 10.2 — ABNORMAL LOW
Potassium: 3.5
Sodium: 137
TCO2: 25

## 2011-03-13 LAB — URINALYSIS, ROUTINE W REFLEX MICROSCOPIC
Glucose, UA: >1000 — AB
Ketones, ur: 15 — AB
Ketones, ur: NEGATIVE
Leukocytes, UA: NEGATIVE
Leukocytes, UA: NEGATIVE
Nitrite: NEGATIVE
Nitrite: NEGATIVE
Protein, ur: >300 — AB
Protein, ur: NEGATIVE
Specific Gravity, Urine: 1.031 — ABNORMAL HIGH
Urobilinogen, UA: 1
pH: 5
pH: 6

## 2011-03-13 LAB — UIFE/LIGHT CHAINS/TP QN, 24-HR UR
Albumin, U: DETECTED
Alpha 2, Urine: DETECTED — AB
Beta, Urine: DETECTED — AB
Total Protein, Urine: 22.6

## 2011-03-13 LAB — PHOSPHORUS: Phosphorus: 3.3

## 2011-03-13 LAB — MAGNESIUM: Magnesium: 1.8

## 2011-03-13 LAB — TYPE AND SCREEN
ABO/RH(D): AB POS
Antibody Screen: NEGATIVE

## 2011-03-13 LAB — OCCULT BLOOD X 1 CARD TO LAB, STOOL: Fecal Occult Bld: NEGATIVE

## 2011-03-13 LAB — FOLATE RBC: RBC Folate: 408

## 2011-03-13 LAB — FERRITIN: Ferritin: 843 — ABNORMAL HIGH (ref 22–322)

## 2011-03-13 LAB — CLOSTRIDIUM DIFFICILE EIA: C difficile Toxins A+B, EIA: NEGATIVE

## 2011-03-13 LAB — FOLATE: Folate: >20

## 2011-03-13 LAB — URINE MICROSCOPIC-ADD ON

## 2011-03-13 LAB — PROTIME-INR: Prothrombin Time: 14.9

## 2011-03-13 LAB — RETICULOCYTES: Retic Count, Absolute: 45.4

## 2011-03-13 LAB — URINE CULTURE: Special Requests: NEGATIVE

## 2011-03-13 LAB — ABO/RH: ABO/RH(D): AB POS

## 2011-03-17 HISTORY — PX: CHOLECYSTECTOMY: SHX55

## 2011-03-19 ENCOUNTER — Emergency Department (HOSPITAL_COMMUNITY): Payer: Medicaid Other

## 2011-03-19 ENCOUNTER — Inpatient Hospital Stay (HOSPITAL_COMMUNITY)
Admission: EM | Admit: 2011-03-19 | Discharge: 2011-03-21 | DRG: 419 | Disposition: A | Discharge status: Home / Self Care | Payer: Medicaid Other | Attending: General Surgery | Admitting: General Surgery

## 2011-03-19 DIAGNOSIS — Z7982 Long term (current) use of aspirin: Secondary | ICD-10-CM

## 2011-03-19 DIAGNOSIS — E119 Type 2 diabetes mellitus without complications: Secondary | ICD-10-CM | POA: Diagnosis present

## 2011-03-19 DIAGNOSIS — R109 Unspecified abdominal pain: Secondary | ICD-10-CM

## 2011-03-19 DIAGNOSIS — Z794 Long term (current) use of insulin: Secondary | ICD-10-CM

## 2011-03-19 DIAGNOSIS — F209 Schizophrenia, unspecified: Secondary | ICD-10-CM | POA: Diagnosis present

## 2011-03-19 DIAGNOSIS — K8000 Calculus of gallbladder with acute cholecystitis without obstruction: Principal | ICD-10-CM | POA: Diagnosis present

## 2011-03-19 DIAGNOSIS — I4891 Unspecified atrial fibrillation: Secondary | ICD-10-CM | POA: Diagnosis present

## 2011-03-19 DIAGNOSIS — I10 Essential (primary) hypertension: Secondary | ICD-10-CM | POA: Diagnosis present

## 2011-03-19 LAB — COMPREHENSIVE METABOLIC PANEL
ALT: 13 U/L (ref 0–53)
Alkaline Phosphatase: 57 U/L (ref 39–117)
CO2: 27 mEq/L (ref 19–32)
Chloride: 102 mEq/L (ref 96–112)
GFR calc Af Amer: 84 mL/min — ABNORMAL LOW (ref >90)
Glucose, Bld: 153 mg/dL — ABNORMAL HIGH (ref 70–99)
Potassium: 4 mEq/L (ref 3.5–5.1)
Sodium: 139 mEq/L (ref 135–145)
Total Protein: 7.7 g/dL (ref 6.0–8.3)

## 2011-03-19 LAB — GLUCOSE, CAPILLARY: Glucose-Capillary: 115 mg/dL — ABNORMAL HIGH (ref 70–99)

## 2011-03-19 LAB — DIFFERENTIAL
Basophils Absolute: 0 10*3/uL (ref 0.0–0.1)
Basophils Relative: 0 % (ref 0–1)
Neutro Abs: 11.4 10*3/uL — ABNORMAL HIGH (ref 1.7–7.7)
Neutrophils Relative %: 83 % — ABNORMAL HIGH (ref 43–77)

## 2011-03-19 LAB — CBC
Hemoglobin: 12.3 g/dL — ABNORMAL LOW (ref 13.0–17.0)
MCHC: 32.9 g/dL (ref 30.0–36.0)
RBC: 4.76 MIL/uL (ref 4.22–5.81)
WBC: 13.8 10*3/uL — ABNORMAL HIGH (ref 4.0–10.5)

## 2011-03-19 MED ORDER — IOHEXOL 300 MG/ML  SOLN
100.0000 mL | Freq: Once | INTRAMUSCULAR | Status: AC | PRN
  Administered 2011-03-19: 100 mL via INTRAVENOUS

## 2011-03-20 ENCOUNTER — Other Ambulatory Visit (INDEPENDENT_AMBULATORY_CARE_PROVIDER_SITE_OTHER): Payer: Self-pay | Admitting: General Surgery

## 2011-03-20 DIAGNOSIS — K8000 Calculus of gallbladder with acute cholecystitis without obstruction: Secondary | ICD-10-CM

## 2011-03-20 DIAGNOSIS — K8010 Calculus of gallbladder with chronic cholecystitis without obstruction: Secondary | ICD-10-CM

## 2011-03-20 LAB — MRSA PCR SCREENING: MRSA by PCR: NEGATIVE

## 2011-03-20 LAB — GLUCOSE, CAPILLARY
Glucose-Capillary: 136 mg/dL — ABNORMAL HIGH (ref 70–99)
Glucose-Capillary: 159 mg/dL — ABNORMAL HIGH (ref 70–99)

## 2011-03-20 NOTE — H&P (Signed)
[name], BARNHART NO.:  [id number]  MEDICAL RECORD NO.:  [id number]  LOCATION:  5127                         FACILITY:  [facility]  PHYSICIAN:  Gabrielle Dare. Janee Morn, M.D.DATE OF BIRTH:  [birth year]  DATE OF ADMISSION:  03/19/2011 DATE OF DISCHARGE:                             HISTORY & PHYSICAL   CHIEF COMPLAINT:  Right upper quadrant abdominal pain, nausea, and vomiting.  HISTORY OF PRESENT ILLNESS:  Mr. [name] is a 50 year old African American male who complains of right upper quadrant abdominal pain, nausea and vomiting that started after eating some chicken and potato salad last night.  The pain persisted in his upper abdomen and became more focused in his right upper quadrant over time.  As the pain did not go away, he came to the emergency department today.  Workup included CT scan of the abdomen and pelvis showing gallstones with pericholecystic fluid and indistinct gallbladder wall consistent with acute cholecystitis.  PAST MEDICAL HISTORY:  Non-insulin-dependent diabetes mellitus.  SOCIAL HISTORY:  He quit smoking.  He does not drink or use drugs.  ALLERGIES:  No known drug allergies.  MEDICATIONS AT HOME:  Lantus 18 units at bedtime.  He also takes aspirin and potassium supplementation.  REVIEW OF SYSTEMS:  GI:  Complaints as above.  CARDIAC:  Negative and while there was some mention in the computer of about a history of atrial fibrillation, he denies any history of that.  PULMONARY: Negative.  GU: Negative.  Remainder of the review of systems is unremarkable with the exception of musculoskeletal.  He has had surgery on his left foot due to chronic wound and he sees Dr. Lajoyce Corners for that.  PHYSICAL EXAMINATION:  VITAL SIGNS:  Temperature 99.1, pulse 81, respirations 20, blood pressure 133/85. GENERAL:  The patient is awake and alert and is in no distress. HEENT:  Ears are clear.  Sclerae have no icterus.  Nares are patent. Oral mucosa is  moist. NECK:  Supple with no tenderness or masses. LUNGS:  Clear to auscultation with good respiratory effort.  No wheezing is heard. HEART:  Regular with no murmurs.  Impulse is vaguely palpable in the left chest. ABDOMEN:  Soft.  There is tenderness present in the right upper quadrant with no masses.  No guarding is noted.  No other tenderness is noted. Bowel sounds are present. EXTREMITIES:  A dressing sock with postop shoe, left foot. NEUROLOGIC:  He is awake and alert.  Speech is fluent.  He follows commands with all extremities.  LABORATORY STUDIES:  Lipase 43.  Sodium 139, potassium 4.0, chloride 102, CO2 27, BUN 30, creatinine 1.14, and glucose 153.  AST 19, ALT 13, alkaline phosphatase 57, and bilirubin 0.3.  White blood cell count 13,800, hemoglobin 12.3, and platelets 261,000.  IMPRESSION:  Acute cholecystitis.  PLAN OF ACTION:  Admit IV fluids, IV antibiotics.  We will plan laparoscopic cholecystectomy with intraoperative cholangiogram tomorrow. Procedure risks and benefits were discussed in detail with the patient. He is agreeable.  We will also manage his diabetes with insulin and sliding scale while he is in the hospital.     Gabrielle Dare. Janee Morn, M.D.     BET/MEDQ  D:  03/19/2011  T:  03/19/2011  Job:  [id number]  cc:   Fleet Contras, M.D.  Electronically Signed by Violeta Gelinas M.D. on 03/20/2011 02:30:54 PM

## 2011-03-21 LAB — COMPREHENSIVE METABOLIC PANEL
ALT: 45 U/L (ref 0–53)
AST: 33 U/L (ref 0–37)
Albumin: 2.8 g/dL — ABNORMAL LOW (ref 3.5–5.2)
Alkaline Phosphatase: 71 U/L (ref 39–117)
Potassium: 3.9 mEq/L (ref 3.5–5.1)
Sodium: 139 mEq/L (ref 135–145)
Total Protein: 6.7 g/dL (ref 6.0–8.3)

## 2011-03-21 LAB — CBC
HCT: 36.6 % — ABNORMAL LOW (ref 39.0–52.0)
MCV: 79 fL (ref 78.0–100.0)
RBC: 4.63 MIL/uL (ref 4.22–5.81)
RDW: 13.6 % (ref 11.5–15.5)
WBC: 14.2 10*3/uL — ABNORMAL HIGH (ref 4.0–10.5)

## 2011-03-25 ENCOUNTER — Encounter (INDEPENDENT_AMBULATORY_CARE_PROVIDER_SITE_OTHER): Payer: Self-pay

## 2011-03-25 ENCOUNTER — Ambulatory Visit (INDEPENDENT_AMBULATORY_CARE_PROVIDER_SITE_OTHER): Payer: Medicaid Other | Admitting: Radiology

## 2011-03-25 VITALS — BP 136/82 | HR 68 | Temp 96.9°F | Resp 14 | Ht 69.0 in | Wt 262.2 lb

## 2011-03-25 DIAGNOSIS — K810 Acute cholecystitis: Secondary | ICD-10-CM

## 2011-03-25 NOTE — Progress Notes (Signed)
[name] [birth year] [id number] 03/25/2011   [name] is a 50 y.o. male who had a laparoscopic cholecystectomy for gangrenous cholecystitis.  The pathology report confirmed necrosis but  No malignancy.  The patient reports that they are feeling well with normal bowel movements and good appetite.  The pre-operative symptoms of abdominal pain, nausea, and vomiting have resolved.    Physical examination - Incisions appear well-healed with no sign of infection or bleeding.   Abdomen - soft, non-tender. His drain is draining serous fluid, no pus, no bile.  Impression:  s/p laparoscopic cholecystectomy  Plan:  He may resume a regular diet and full activity.  He may follow-up on a PRN basis. He has to get some diabetic toe surgery done by Dr. Lajoyce Corners and I see no reason from our standpoint why he couldn't procedd.

## 2011-03-27 ENCOUNTER — Encounter (HOSPITAL_COMMUNITY)
Admission: RE | Admit: 2011-03-27 | Discharge: 2011-03-27 | Disposition: A | Discharge status: Home / Self Care | Payer: Medicaid Other | Source: Ambulatory Visit | Attending: Orthopedic Surgery | Admitting: Orthopedic Surgery

## 2011-03-27 LAB — CBC
HCT: 35.8 % — ABNORMAL LOW (ref 39.0–52.0)
Hemoglobin: 11.5 g/dL — ABNORMAL LOW (ref 13.0–17.0)
MCHC: 32.1 g/dL (ref 30.0–36.0)
MCV: 79.7 fL (ref 78.0–100.0)

## 2011-03-27 LAB — DIFFERENTIAL
Basophils Absolute: 0.1 10*3/uL (ref 0.0–0.1)
Lymphocytes Relative: 16 % (ref 12–46)
Lymphs Abs: 1.9 10*3/uL (ref 0.7–4.0)
Monocytes Absolute: 0.9 10*3/uL (ref 0.1–1.0)
Monocytes Relative: 8 % (ref 3–12)
Neutro Abs: 8.5 10*3/uL — ABNORMAL HIGH (ref 1.7–7.7)

## 2011-03-27 LAB — BASIC METABOLIC PANEL
BUN: 27 mg/dL — ABNORMAL HIGH (ref 6–23)
GFR calc non Af Amer: 69 mL/min — ABNORMAL LOW (ref >90)
Glucose, Bld: 130 mg/dL — ABNORMAL HIGH (ref 70–99)
Potassium: 5.4 mEq/L — ABNORMAL HIGH (ref 3.5–5.1)

## 2011-03-28 ENCOUNTER — Ambulatory Visit (HOSPITAL_COMMUNITY)
Admission: RE | Admit: 2011-03-28 | Discharge: 2011-03-28 | Disposition: A | Discharge status: Home / Self Care | Payer: Medicaid Other | Source: Ambulatory Visit | Attending: Orthopedic Surgery | Admitting: Orthopedic Surgery

## 2011-03-28 DIAGNOSIS — E669 Obesity, unspecified: Secondary | ICD-10-CM | POA: Insufficient documentation

## 2011-03-28 DIAGNOSIS — M9080 Osteopathy in diseases classified elsewhere, unspecified site: Secondary | ICD-10-CM | POA: Insufficient documentation

## 2011-03-28 DIAGNOSIS — E1169 Type 2 diabetes mellitus with other specified complication: Secondary | ICD-10-CM | POA: Insufficient documentation

## 2011-03-28 DIAGNOSIS — Z01812 Encounter for preprocedural laboratory examination: Secondary | ICD-10-CM | POA: Insufficient documentation

## 2011-03-28 DIAGNOSIS — M869 Osteomyelitis, unspecified: Secondary | ICD-10-CM | POA: Insufficient documentation

## 2011-03-28 LAB — PROTIME-INR
INR: 1.08 (ref 0.00–1.49)
Prothrombin Time: 14.2 seconds (ref 11.6–15.2)

## 2011-03-28 LAB — GLUCOSE, CAPILLARY
Glucose-Capillary: 124 mg/dL — ABNORMAL HIGH (ref 70–99)
Glucose-Capillary: 111 mg/dL — ABNORMAL HIGH (ref 70–99)
Glucose-Capillary: 102 mg/dL — ABNORMAL HIGH (ref 70–99)

## 2011-03-28 LAB — HEPATIC FUNCTION PANEL
Albumin: 3.2 g/dL — ABNORMAL LOW (ref 3.5–5.2)
Bilirubin, Direct: <0.1 mg/dL (ref 0.0–0.3)
Total Bilirubin: 0.2 mg/dL — ABNORMAL LOW (ref 0.3–1.2)

## 2011-04-01 NOTE — Op Note (Signed)
NAMEHURSHEL, BOUILLON NO.:  [id number]  MEDICAL RECORD NO.:  [id number]  LOCATION:  5127                         FACILITY:  [facility]  PHYSICIAN:  Gabrielle Dare. Janee Morn, M.D.DATE OF BIRTH:  August 09, 1960  DATE OF PROCEDURE:  03/20/2011 DATE OF DISCHARGE:                              OPERATIVE REPORT   PREOPERATIVE DIAGNOSIS:  Acute cholecystitis.  POSTOPERATIVE DIAGNOSIS:  Gangrenous cholecystitis.  PROCEDURE:  Laparoscopic cholecystectomy.  SURGEON:  Gabrielle Dare. Janee Morn, MD.  ASSISTANT:  Festus Barren, PA-C.  ANESTHESIA:  General endotracheal.  HISTORY OF PRESENT ILLNESS:  [name] is a 50 year old African American gentleman with history of insulin-dependent diabetes mellitus, who we admitted yesterday with acute cholecystitis.  He has been treated with IV antibiotics and is brought for cholecystectomy.  PROCEDURE IN DETAIL:  Informed consent was obtained.  The patient was identified in the preop holding area.  He received intravenous antibiotics on protocol.  He was brought to the operating room.  General endotracheal anesthesia was administered by the Anesthesia staff and his abdomen was prepped and draped in sterile fashion.  Time-out procedure was done.  Supraumbilical region was infiltrated with 0.25% Marcaine with epinephrine.  Supraumbilical incision was made.  Subcutaneous tissues were dissected down, revealing anterior fascia, this was divided sharply along the midline and the peritoneal cavities entered under direct vision.  A 0 Vicryl pursestring suture was placed around the fascial opening.  The Hasson trocar was inserted into the abdomen.  The abdomen was insufflated with carbon dioxide in standard fashion.  Under direct vision, initially a 5-mm epigastric port was placed and two 5-mm right lateral ports were placed.  These were all placed under direct vision and local anesthetic was used at each port site.  The gallbladder which was palpable  through the skin was noted to be very distended with patchy gangrenous necrosis.  There was no frank perforation.  There were lot of filmy omental adhesions which were gradually swept down.  We then drained the gallbladder with a GI drainage system.  The dome of the gallbladder was then retracted superior medially.  This allowed [place] to sweep down the filmy omental adhesions revealing the body and eventually the infundibulum of the gallbladder.  The infundibulum was then retracted inferolaterally.  Dissection began laterally and progressed medially.  We first identified the cystic artery.  This was clipped twice proximally, once distally, and divided.  Further dissection was done and we were able to identify the cystic duct, however, the gallbladder was had patchy gangrenous necrosis all the way down and including the duct.  We were able to circumferentially dissect it and make a critical view visible between the infundibulum, the gallbladder, and the liver.  Once we had excellent visualization, three clips were placed proximally on the cystic duct and it was divided.  Due to its somewhat tenuous nature, we planned to place a drain.  We were not able to do a cholangiogram.  At this point, the gallbladder was taken off the liver bed with Bovie cautery.  The back wall had significant areas of necrosis.  The dissection continued along the liver bed.  The dissection revealed another posterior branch of the cystic  artery.  This was clipped twice proximally, once distally, and divided.  The gallbladder was taken rest of the way off the liver bed and again the wall had significant necrosis.  The gallbladder was placed in an EndoCatch bag and taken out of the abdomen via the supraumbilical port site.  Next, we upsized the 5-mm epigastric port to a 11-mm in order to place a larger suction device due to a contaminated nature of this patient's gallbladder.  Once we placed the larger suction device, we  copiously irrigated the liver bed, irrigated out a few stones that had been dropped.  We continued irrigation with a couple liters of warm saline until it was completely clear.  There was no further stone debris.  The liver bed was dry with no bleeding.  Clips were in good position.  Liver bed had good hemostasis.  The remainder of the irrigation fluid was evacuated and it was clear.  A 19-French Blake drain was placed in the liver bed and brought out through one of the lateral port sites and secured with 3-0 nylon suture.  The pneumoperitoneum was released.  The ports were removed.  The supraumbilical fascia was closed by tying 0 Vicryl pursestring suture with care not to trap any intra-abdominal contents.  Three remaining wounds were copiously irrigated and the skin of each was closed with running 4-0 Vicryl subcuticular stitch followed by Dermabond.  Sponge, needle, and instrument counts were all correct. The patient tolerated the procedure well without apparent complications and was taken to recovery room in stable condition.     Gabrielle Dare Janee Morn, M.D.     BET/MEDQ  D:  03/20/2011  T:  03/20/2011  Job:  [id number]  cc:   Fleet Contras, M.D. Electronically Signed by Violeta Gelinas M.D. on 04/01/2011 01:27:38 PM

## 2011-04-02 NOTE — Op Note (Signed)
  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  PROCEDURE:  Amputation, left great toe through the metatarsophalangeal joint.  SURGEON:  [name], MD  ANESTHESIA:  General.  ESTIMATED BLOOD LOSS:  Minimal.  ANTIBIOTICS:  Kefzol 2 g.  DRAINS:  None.  COMPLICATION:  None.  TOURNIQUET TIME:  None.  SPECIMENS:  Sent to pathology.  DISPOSITION:  To PACU in stable condition.  INDICATIONS FOR PROCEDURE:  The patient is a 50 year old gentleman with type 2 diabetes with a chronic ulcer beneath the left great toe. Radiograph shows chronic lytic changes secondary to chronic osteomyelitis.  He has an ulcer which probes to bone and he presents at this time for surgical intervention.  Risks and benefits were discussed including persistent infection, neurovascular injury, nonhealing of the wound, potential for new higher level amputation.  The patient states he understands and wished to proceed at this time.  DESCRIPTION OF PROCEDURE:  The patient brought to OR room 10 and underwent a general anesthetic.  After adequate level of anesthesia was obtained, the patient's left lower extremity was prepped using DuraPrep and draped in a sterile field.  A fishmouth incision was made around the MTP joint.  The dissection was carried down to the MTP joint.  The toe was amputated through the joint, was delivered off the field, and sent to pathology.  Electrocautery was used for hemostasis.  The wound was irrigated with normal saline.  There was  no purulence, no abscess, and no nonviable tissue.  The wound was closed with 2-0 nylon with a near- far-far-near suture technique as well as modified vertical mattress suture technique.  The wound was covered with Adaptic, orthopedic sponges, ABD dressing, Kerlix, and Coban.  The patient's leg was wrapped with a compressive wrap  due to the edema and this compressive wrap extended down to the foot.  The patient was extubated and taken to PACU in stable condition.  Prescription for Percocet #30 provided.  Follow up in the office in 1 week.     [name], MD     MVD/MEDQ  D:  03/28/2011  T:  03/28/2011  Job:  [id number]  Electronically Signed by Aldean Baker MD on 04/02/2011 06:27:30 AM

## 2011-04-03 LAB — CARDIAC PANEL(CRET KIN+CKTOT+MB+TROPI)
CK, MB: 1.7
Relative Index: 0.8
Total CK: 208
Troponin I: 0.05

## 2011-04-03 LAB — BASIC METABOLIC PANEL
BUN: 20
BUN: 23
BUN: 44 — ABNORMAL HIGH
CO2: 27
Calcium: 8.2 — ABNORMAL LOW
Calcium: 8.4
Calcium: 8.8
Chloride: 105
Chloride: 108
Creatinine, Ser: 1.84 — ABNORMAL HIGH
GFR calc non Af Amer: 40 — ABNORMAL LOW
GFR calc non Af Amer: 60 — ABNORMAL LOW
GFR calc non Af Amer: >60
Glucose, Bld: 260 — ABNORMAL HIGH
Potassium: 3.4 — ABNORMAL LOW
Potassium: 3.4 — ABNORMAL LOW
Potassium: 3.8
Sodium: 139
Sodium: 141

## 2011-04-03 LAB — CBC
HCT: 39
HCT: 44.5
Hemoglobin: 12.6 — ABNORMAL LOW
MCHC: 32
MCV: 79.3
Platelets: 224
Platelets: 277
Platelets: 313
RBC: 4.96
RDW: 13.5
WBC: 16 — ABNORMAL HIGH
WBC: 8.2

## 2011-04-03 LAB — DIFFERENTIAL
Basophils Absolute: 0
Basophils Relative: 0
Eosinophils Relative: 0
Monocytes Absolute: 1.2 — ABNORMAL HIGH

## 2011-04-03 LAB — COMPREHENSIVE METABOLIC PANEL
AST: 16
Albumin: 3.8
Alkaline Phosphatase: 75
Chloride: 102
GFR calc Af Amer: 41 — ABNORMAL LOW
Potassium: 3.8
Sodium: 143
Total Bilirubin: 1.6 — ABNORMAL HIGH

## 2011-04-03 LAB — D-DIMER, QUANTITATIVE: D-Dimer, Quant: 5.37 — ABNORMAL HIGH

## 2011-04-03 LAB — SODIUM, URINE, RANDOM: Sodium, Ur: 29

## 2011-04-03 LAB — URINE MICROSCOPIC-ADD ON

## 2011-04-03 LAB — HEPATIC FUNCTION PANEL
ALT: 14
Alkaline Phosphatase: 67
Bilirubin, Direct: 0.1
Indirect Bilirubin: 1 — ABNORMAL HIGH
Total Bilirubin: 1.1

## 2011-04-03 LAB — URINALYSIS, ROUTINE W REFLEX MICROSCOPIC
Glucose, UA: >1000 — AB
Ketones, ur: 40 — AB
Leukocytes, UA: NEGATIVE
Protein, ur: >300 — AB

## 2011-04-03 LAB — CK TOTAL AND CKMB (NOT AT ARMC)
CK, MB: 2
Relative Index: 0.8

## 2011-04-03 LAB — URINE CULTURE
Colony Count: NO GROWTH
Culture: NO GROWTH
Special Requests: NEGATIVE

## 2011-04-03 LAB — POCT CARDIAC MARKERS
CKMB, poc: 1.5
Operator id: 189501
Troponin i, poc: <0.05

## 2011-04-03 LAB — LIPID PANEL
Cholesterol: 187
LDL Cholesterol: 142 — ABNORMAL HIGH

## 2011-04-03 LAB — TROPONIN I: Troponin I: 0.03

## 2011-11-11 ENCOUNTER — Other Ambulatory Visit: Payer: Self-pay | Admitting: Ophthalmology

## 2011-11-11 NOTE — H&P (Signed)
  Pre-operative History and Physical for Ophthalmic Surgery  [name] 11/11/2011                  Chief Complaint: Blocked vision right eye  Diagnosis: Vitreous Hemorrhage, sub-hyaloid Right Eye                     Proliferative Diabetic Retinopathy Right Eye  No Known Allergies   Prior to Admission medications   Medication Sig Start Date End Date Taking? Authorizing Provider  aspirin 81 MG tablet Take 81 mg by mouth daily.      Historical Provider, MD  Insulin Aspart (NOVOLOG [address]) Inject 18 Units into the skin daily. Takes 18 units daily before bedtime     Historical Provider, MD  POTASSIUM CHLORIDE PO Take by mouth daily. Patient unsure of dosage at this time. To confirm next appt. Patient also takes a water pill and unaware of name or dosage.     Historical Provider, MD    Planned Procedure: Pars Plana Vitrectomy, Membrane Peeling and Endolaser  Right Eye                          There were no vitals filed for this visit.  Pulse: 76         Temp: NA        Resp:  16      ROS: negative apart form visual symptoms  Past Medical History  Diagnosis Date  . Diabetes mellitus     type 2  . Leg swelling   . Visual disturbance     Past Surgical History  Procedure Date  . Foot surgery 11/2007    small toe on left foot removed.  . Cholecystectomy 03/2011  . Nasal septum surgery 1981    Due to broken nose. Surgery was to repair how bone healed.     History   Social History  . Marital Status: Single    Spouse Name: N/A    Number of Children: N/A  . Years of Education: N/A   Occupational History  . Not on file.   Social History Main Topics  . Smoking status: Former Smoker    Quit date: 03/25/1979  . Smokeless tobacco: Never Used  . Alcohol Use: Yes     occasional  . Drug Use: No  . Sexually Active: Not on file   Other Topics Concern  . Not on file   Social History Narrative  . No narrative on file     The following examination is for anesthesia  clearance for minimally invasive Ophthalmic surgery. It is primarily to document heart and lung findings and is not intended to elucidate unknown general medical conditions inclusive of abdominal masses, lung lesions, etc.   General Constitution:  Within Normal Limits   Alertness/Orientation:  Person, time place     yes   HEENT:  Eye Findings: Vitreous Hemoorhage with persistent layer overlying the Macula                   right eye  Neck: supple without masses  Chest/Lungs: clear to auscultation  Cardiac: Normal S1 and S2 without Murmur, S3 or S4  Neuro: non-focal    Impression: Subhyaloid Vitreous Hemorrhage OD, non-clearing  Planned Procedure:  Pars Plana Vitrectomy, Membrane Peeling, Dan Flemings, MD

## 2011-11-12 ENCOUNTER — Encounter (HOSPITAL_COMMUNITY): Payer: Self-pay | Admitting: Pharmacy Technician

## 2011-11-13 ENCOUNTER — Encounter (HOSPITAL_COMMUNITY): Payer: Self-pay

## 2011-11-13 ENCOUNTER — Encounter (HOSPITAL_COMMUNITY)
Admission: RE | Admit: 2011-11-13 | Discharge: 2011-11-13 | Disposition: A | Discharge status: Home / Self Care | Payer: Medicaid Other | Source: Ambulatory Visit | Attending: Ophthalmology | Admitting: Ophthalmology

## 2011-11-13 ENCOUNTER — Encounter (HOSPITAL_COMMUNITY)
Admission: RE | Admit: 2011-11-13 | Discharge: 2011-11-13 | Disposition: A | Discharge status: Home / Self Care | Payer: Medicaid Other | Source: Ambulatory Visit | Attending: Anesthesiology | Admitting: Anesthesiology

## 2011-11-13 HISTORY — DX: Encounter for other specified aftercare: Z51.89

## 2011-11-13 LAB — CBC
MCH: 26.2 pg (ref 26.0–34.0)
MCHC: 32.3 g/dL (ref 30.0–36.0)
MCV: 81.3 fL (ref 78.0–100.0)
Platelets: 243 10*3/uL (ref 150–400)
RDW: 13.8 % (ref 11.5–15.5)
WBC: 9.7 10*3/uL (ref 4.0–10.5)

## 2011-11-13 LAB — BASIC METABOLIC PANEL
BUN: 24 mg/dL — ABNORMAL HIGH (ref 6–23)
CO2: 28 mEq/L (ref 19–32)
Calcium: 9.5 mg/dL (ref 8.4–10.5)
Creatinine, Ser: 1.14 mg/dL (ref 0.50–1.35)

## 2011-11-13 NOTE — Pre-Procedure Instructions (Signed)
19 [name]  11/13/2011   Your procedure is scheduled on:  Monday, 11/17/2011 @7 :30AM.  Report to [facility] at 5:30AM.  Call this number if you have problems the morning of surgery: [phone]   Remember:   Do not eat food:After Midnight.  May have clear liquids: up to 4 Hours before arrival( nothing after 1:30AM).  Clear liquids include soda, tea, black coffee, apple or grape juice, broth.  Take these medicines the morning of surgery with A SIP OF WATER: None   Do not wear jewelry, make-up or nail polish.  Do not wear lotions, powders, or perfumes. You may wear deodorant.  Do not shave 48 hours prior to surgery. Men may shave face and neck.  Do not bring valuables to the hospital.  Contacts, dentures or bridgework may not be worn into surgery.  Leave suitcase in the car. After surgery it may be brought to your room.  For patients admitted to the hospital, checkout time is 11:00 AM the day of discharge.   Patients discharged the day of surgery will not be allowed to drive home.  Name and phone number of your driver: Dwan Bolt, sister, [phone].  Special Instructions: CHG Shower Use Special Wash: 1/2 bottle night before surgery and 1/2 bottle morning of surgery.   Please read over the following fact sheets that you were given: Pain Booklet, Coughing and Deep Breathing and Surgical Site Infection Prevention

## 2011-11-13 NOTE — Progress Notes (Addendum)
Pt seen for PAT assessment/lab visit.  Denies having any cardiac and/or sleep studies. Requested old EKG and recent OV notes from pt's PCP: Dr. Dorma Russell Avbuere(#[phone], [phone]). Pt instructed on preop instructions: date/time of procedure & arrival, Hibiclens shower x 2,  Mupirocin ointment if PCR positive, coughing and deep breathing,  meds to take day of surgery(none) and surgical site infection.  Pt verbalized understanding.//L. Jaretzy Lhommedieu,RN

## 2011-11-14 NOTE — Progress Notes (Signed)
Requested recent office visit and previous EKG from Dr. Concepcion Elk.

## 2011-11-14 NOTE — Progress Notes (Signed)
Received  copies of office visit notes and EKG from Dr. Albertina Parr office.//L. Elad Macphail,RN

## 2011-11-17 ENCOUNTER — Encounter (HOSPITAL_COMMUNITY): Payer: Self-pay | Admitting: Anesthesiology

## 2011-11-17 ENCOUNTER — Encounter (HOSPITAL_COMMUNITY): Payer: Self-pay | Admitting: *Deleted

## 2011-11-17 ENCOUNTER — Ambulatory Visit (HOSPITAL_COMMUNITY)
Admission: RE | Admit: 2011-11-17 | Discharge: 2011-11-17 | Disposition: A | Discharge status: Home / Self Care | Payer: Medicaid Other | Source: Ambulatory Visit | Attending: Ophthalmology | Admitting: Ophthalmology

## 2011-11-17 ENCOUNTER — Ambulatory Visit (HOSPITAL_COMMUNITY): Payer: Medicaid Other | Admitting: Anesthesiology

## 2011-11-17 ENCOUNTER — Encounter (HOSPITAL_COMMUNITY)
Admission: RE | Disposition: A | Discharge status: Home / Self Care | Payer: Self-pay | Source: Ambulatory Visit | Attending: Ophthalmology

## 2011-11-17 DIAGNOSIS — Z01812 Encounter for preprocedural laboratory examination: Secondary | ICD-10-CM | POA: Insufficient documentation

## 2011-11-17 DIAGNOSIS — Z794 Long term (current) use of insulin: Secondary | ICD-10-CM | POA: Insufficient documentation

## 2011-11-17 DIAGNOSIS — H4310 Vitreous hemorrhage, unspecified eye: Secondary | ICD-10-CM | POA: Insufficient documentation

## 2011-11-17 DIAGNOSIS — E1139 Type 2 diabetes mellitus with other diabetic ophthalmic complication: Secondary | ICD-10-CM | POA: Insufficient documentation

## 2011-11-17 DIAGNOSIS — E11359 Type 2 diabetes mellitus with proliferative diabetic retinopathy without macular edema: Secondary | ICD-10-CM | POA: Insufficient documentation

## 2011-11-17 HISTORY — PX: PARS PLANA VITRECTOMY: SHX2166

## 2011-11-17 LAB — GLUCOSE, CAPILLARY: Glucose-Capillary: 97 mg/dL (ref 70–99)

## 2011-11-17 SURGERY — PARS PLANA VITRECTOMY WITH 23 GAUGE
Anesthesia: General | Site: Eye | Laterality: Right | Wound class: Clean

## 2011-11-17 MED ORDER — GATIFLOXACIN 0.5 % OP SOLN
OPHTHALMIC | Status: AC
  Administered 2011-11-17: 1 [drp] via OPHTHALMIC
  Filled 2011-11-17: qty 2.5

## 2011-11-17 MED ORDER — ONDANSETRON HCL 4 MG/2ML IJ SOLN
INTRAMUSCULAR | Status: DC | PRN
  Administered 2011-11-17: 4 mg via INTRAVENOUS

## 2011-11-17 MED ORDER — BSS IO SOLN
INTRAOCULAR | Status: DC | PRN
  Administered 2011-11-17: 07:00:00

## 2011-11-17 MED ORDER — GLYCOPYRROLATE 0.2 MG/ML IJ SOLN
INTRAMUSCULAR | Status: DC | PRN
  Administered 2011-11-17: .6 mg via INTRAVENOUS

## 2011-11-17 MED ORDER — PHENYLEPHRINE HCL 2.5 % OP SOLN
OPHTHALMIC | Status: AC
  Administered 2011-11-17: 1 [drp] via OPHTHALMIC
  Filled 2011-11-17: qty 3

## 2011-11-17 MED ORDER — PREDNISOLONE ACETATE 1 % OP SUSP
OPHTHALMIC | Status: AC
  Filled 2011-11-17: qty 5

## 2011-11-17 MED ORDER — TETRACAINE HCL 0.5 % OP SOLN
OPHTHALMIC | Status: AC
  Filled 2011-11-17: qty 2

## 2011-11-17 MED ORDER — TETRACAINE HCL 0.5 % OP SOLN
2.0000 [drp] | OPHTHALMIC | Status: AC
  Administered 2011-11-17: 2 [drp] via OPHTHALMIC

## 2011-11-17 MED ORDER — BUPIVACAINE HCL 0.75 % IJ SOLN
INTRAMUSCULAR | Status: DC | PRN
  Administered 2011-11-17: 10 mL

## 2011-11-17 MED ORDER — FENTANYL CITRATE 0.05 MG/ML IJ SOLN
INTRAMUSCULAR | Status: DC | PRN
  Administered 2011-11-17: 100 ug via INTRAVENOUS

## 2011-11-17 MED ORDER — PHENYLEPHRINE HCL 2.5 % OP SOLN
1.0000 [drp] | OPHTHALMIC | Status: AC | PRN
  Administered 2011-11-17 (×3): 1 [drp] via OPHTHALMIC

## 2011-11-17 MED ORDER — DEXAMETHASONE SODIUM PHOSPHATE 10 MG/ML IJ SOLN
INTRAMUSCULAR | Status: DC | PRN
  Administered 2011-11-17: 10 mg

## 2011-11-17 MED ORDER — LIDOCAINE HCL (CARDIAC) 20 MG/ML IV SOLN
INTRAVENOUS | Status: DC | PRN
  Administered 2011-11-17: 80 mg via INTRAVENOUS

## 2011-11-17 MED ORDER — HYPROMELLOSE (GONIOSCOPIC) 2.5 % OP SOLN
OPHTHALMIC | Status: DC | PRN
  Administered 2011-11-17: 2 [drp] via OPHTHALMIC

## 2011-11-17 MED ORDER — MIDAZOLAM HCL 5 MG/5ML IJ SOLN
INTRAMUSCULAR | Status: DC | PRN
  Administered 2011-11-17: 2 mg via INTRAVENOUS

## 2011-11-17 MED ORDER — CEFAZOLIN SUBCONJUNCTIVAL INJECTION 100 MG/0.5 ML
1.0000 mL | INJECTION | Freq: Once | SUBCONJUNCTIVAL | Status: DC
  Filled 2011-11-17: qty 1

## 2011-11-17 MED ORDER — 0.9 % SODIUM CHLORIDE (POUR BTL) OPTIME
TOPICAL | Status: DC | PRN
  Administered 2011-11-17: 1000 mL

## 2011-11-17 MED ORDER — SODIUM CHLORIDE 0.9 % IV SOLN
INTRAVENOUS | Status: DC | PRN
  Administered 2011-11-17: 07:00:00 via INTRAVENOUS

## 2011-11-17 MED ORDER — PROPOFOL 10 MG/ML IV EMUL
INTRAVENOUS | Status: DC | PRN
  Administered 2011-11-17: 200 mg via INTRAVENOUS

## 2011-11-17 MED ORDER — NEOSTIGMINE METHYLSULFATE 1 MG/ML IJ SOLN
INTRAMUSCULAR | Status: DC | PRN
  Administered 2011-11-17: 4 mg via INTRAVENOUS

## 2011-11-17 MED ORDER — BACITRACIN-POLYMYXIN B 500-10000 UNIT/GM OP OINT
TOPICAL_OINTMENT | OPHTHALMIC | Status: DC | PRN
  Administered 2011-11-17: 1 via OPHTHALMIC

## 2011-11-17 MED ORDER — PREDNISOLONE ACETATE 1 % OP SUSP
1.0000 [drp] | OPHTHALMIC | Status: AC
  Administered 2011-11-17: 1 [drp] via OPHTHALMIC

## 2011-11-17 MED ORDER — ROCURONIUM BROMIDE 100 MG/10ML IV SOLN
INTRAVENOUS | Status: DC | PRN
  Administered 2011-11-17: 50 mg via INTRAVENOUS

## 2011-11-17 MED ORDER — GATIFLOXACIN 0.5 % OP SOLN
1.0000 [drp] | OPHTHALMIC | Status: AC | PRN
  Administered 2011-11-17 (×3): 1 [drp] via OPHTHALMIC

## 2011-11-17 SURGICAL SUPPLY — 74 items
ACCESSORY FRAGMATOME (MISCELLANEOUS) IMPLANT
APPLICATOR COTTON TIP 6IN STRL (MISCELLANEOUS) ×2 IMPLANT
APPLICATOR DR MATTHEWS STRL (MISCELLANEOUS) IMPLANT
BAG MINI COLL DRAIN (WOUND CARE) ×2 IMPLANT
BLADE EYE MINI 60D BEAVER (BLADE) IMPLANT
BLADE KERATOME 2.75 (BLADE) IMPLANT
BLADE MVR KNIFE 19G (BLADE) IMPLANT
BLADE STAB KNIFE 15DEG (BLADE) IMPLANT
CANNULA DUAL BORE 23G (CANNULA) IMPLANT
CANNULA FLEX TIP 23G (CANNULA) ×2 IMPLANT
CLOTH BEACON ORANGE TIMEOUT ST (SAFETY) ×2 IMPLANT
CORDS BIPOLAR (ELECTRODE) IMPLANT
DRAPE OPHTHALMIC 77X100 STRL (CUSTOM PROCEDURE TRAY) ×2 IMPLANT
DRAPE POUCH INSTRU U-SHP 10X18 (DRAPES) ×2 IMPLANT
DRSG TEGADERM 4X4.75 (GAUZE/BANDAGES/DRESSINGS) ×2 IMPLANT
ERASER HMR WETFIELD 23G BP (MISCELLANEOUS) IMPLANT
FILTER BLUE MILLIPORE (MISCELLANEOUS) IMPLANT
GAS OPHTHALMIC (MISCELLANEOUS) IMPLANT
GLOVE SS BIOGEL STRL SZ 6.5 (GLOVE) ×2 IMPLANT
GLOVE SUPERSENSE BIOGEL SZ 6.5 (GLOVE) ×2
GOWN SRG XL XLNG 56XLVL 4 (GOWN DISPOSABLE) ×1 IMPLANT
GOWN STRL NON-REIN LRG LVL3 (GOWN DISPOSABLE) ×2 IMPLANT
GOWN STRL NON-REIN XL XLG LVL4 (GOWN DISPOSABLE) ×1
ILLUMINATOR WIDEFIELD DIFF (MISCELLANEOUS) ×2 IMPLANT
KIT BASIN OR (CUSTOM PROCEDURE TRAY) ×2 IMPLANT
KIT ROOM TURNOVER OR (KITS) ×2 IMPLANT
KNIFE CRESCENT 1.75 EDGEAHEAD (BLADE) IMPLANT
KNIFE GRIESHABER SHARP 2.5MM (MISCELLANEOUS) IMPLANT
MARKER SKIN DUAL TIP RULER LAB (MISCELLANEOUS) IMPLANT
MASK EYE SHIELD (GAUZE/BANDAGES/DRESSINGS) ×2 IMPLANT
NEEDLE 18GX1X1/2 (RX/OR ONLY) (NEEDLE) ×2 IMPLANT
NEEDLE 22X1 1/2 (OR ONLY) (NEEDLE) ×2 IMPLANT
NEEDLE 25GX 5/8IN NON SAFETY (NEEDLE) ×2 IMPLANT
NEEDLE FILTER BLUNT 18X 1/2SAF (NEEDLE) ×1
NEEDLE FILTER BLUNT 18X1 1/2 (NEEDLE) ×1 IMPLANT
NEEDLE HYPO 30X.5 LL (NEEDLE) ×4 IMPLANT
NS IRRIG 1000ML POUR BTL (IV SOLUTION) ×2 IMPLANT
PACK COMBINED CATERACT/VIT 23G (OPHTHALMIC RELATED) IMPLANT
PACK VITRECTOMY CUSTOM (CUSTOM PROCEDURE TRAY) ×2 IMPLANT
PACK VITRECTOMY PIC MCHSVP (PACKS) IMPLANT
PAD ARMBOARD 7.5X6 YLW CONV (MISCELLANEOUS) ×2 IMPLANT
PAD EYE OVAL STERILE LF (GAUZE/BANDAGES/DRESSINGS) ×2 IMPLANT
PAK VITRECTOMY PIK  23GA (OPHTHALMIC RELATED) ×2 IMPLANT
PENCIL BIPOLAR 25GA STR DISP (OPHTHALMIC RELATED) IMPLANT
PHACO TIP KELMAN 45DEG (TIP) IMPLANT
PROBE DIRECTIONAL LASER (MISCELLANEOUS) ×2 IMPLANT
PROBE VITREOUS 25+ ACCURUS (MISCELLANEOUS) ×2 IMPLANT
ROLLS DENTAL (MISCELLANEOUS) ×4 IMPLANT
SCRAPER DIAMOND DUST MEMBRANE (MISCELLANEOUS) IMPLANT
SET FLUID INJECTOR (SET/KITS/TRAYS/PACK) IMPLANT
SET VGFI TUBING 8065808002 (SET/KITS/TRAYS/PACK) IMPLANT
SHUTTLE MONARCH TYPE A (NEEDLE) IMPLANT
SOLUTION ANTI FOG 6CC (MISCELLANEOUS) ×2 IMPLANT
SPEAR EYE SURG WECK-CEL (MISCELLANEOUS) ×6 IMPLANT
SUT ETHILON 10 0 CS140 6 (SUTURE) IMPLANT
SUT ETHILON 4 0 P 3 18 (SUTURE) IMPLANT
SUT ETHILON 5 0 P 3 18 (SUTURE)
SUT ETHILON 9 0 TG140 8 (SUTURE) IMPLANT
SUT NYLON 10.0 BLK (SUTURE) IMPLANT
SUT NYLON ETHILON 5-0 P-3 1X18 (SUTURE) IMPLANT
SUT PLAIN 6 0 TG1408 (SUTURE) IMPLANT
SUT POLY NON ABSORB 10-0 8 STR (SUTURE) IMPLANT
SUT VICRYL 7 0 TG140 8 (SUTURE) IMPLANT
SUT VICRYL ABS 6-0 S29 18IN (SUTURE) IMPLANT
SYR 20CC LL (SYRINGE) IMPLANT
SYR 5ML LL (SYRINGE) IMPLANT
SYR TB 1ML LUER SLIP (SYRINGE) ×2 IMPLANT
SYRINGE 10CC LL (SYRINGE) IMPLANT
TAPE SURG TRANSPORE 1 IN (GAUZE/BANDAGES/DRESSINGS) ×1 IMPLANT
TAPE SURGICAL TRANSPORE 1 IN (GAUZE/BANDAGES/DRESSINGS) ×1
TOWEL OR 17X24 6PK STRL BLUE (TOWEL DISPOSABLE) IMPLANT
TUBE EXTENSION HAMMER (TUBING) IMPLANT
WATER STERILE IRR 1000ML POUR (IV SOLUTION) ×2 IMPLANT
WIPE INSTRUMENT VISIWIPE 73X73 (MISCELLANEOUS) ×2 IMPLANT

## 2011-11-17 NOTE — H&P (View-Only) (Signed)
  Pre-operative History and Physical for Ophthalmic Surgery  [name] 11/11/2011                  Chief Complaint: Blocked vision right eye  Diagnosis: Vitreous Hemorrhage, sub-hyaloid Right Eye                     Proliferative Diabetic Retinopathy Right Eye  No Known Allergies   Prior to Admission medications   Medication Sig Start Date End Date Taking? Authorizing Provider  aspirin 81 MG tablet Take 81 mg by mouth daily.      Historical Provider, MD  Insulin Aspart (NOVOLOG [place]) Inject 18 Units into the skin daily. Takes 18 units daily before bedtime     Historical Provider, MD  POTASSIUM CHLORIDE PO Take by mouth daily. Patient unsure of dosage at this time. To confirm next appt. Patient also takes a water pill and unaware of name or dosage.     Historical Provider, MD    Planned Procedure: Pars Plana Vitrectomy, Membrane Peeling and Endolaser  Right Eye                          There were no vitals filed for this visit.  Pulse: 76         Temp: NA        Resp:  16      ROS: negative apart form visual symptoms  Past Medical History  Diagnosis Date  . Diabetes mellitus     type 2  . Leg swelling   . Visual disturbance     Past Surgical History  Procedure Date  . Foot surgery 11/2007    small toe on left foot removed.  . Cholecystectomy 03/2011  . Nasal septum surgery 1981    Due to broken nose. Surgery was to repair how bone healed.     History   Social History  . Marital Status: Single    Spouse Name: N/A    Number of Children: N/A  . Years of Education: N/A   Occupational History  . Not on file.   Social History Main Topics  . Smoking status: Former Smoker    Quit date: 03/25/1979  . Smokeless tobacco: Never Used  . Alcohol Use: Yes     occasional  . Drug Use: No  . Sexually Active: Not on file   Other Topics Concern  . Not on file   Social History Narrative  . No narrative on file     The following examination is for anesthesia  clearance for minimally invasive Ophthalmic surgery. It is primarily to document heart and lung findings and is not intended to elucidate unknown general medical conditions inclusive of abdominal masses, lung lesions, etc.   General Constitution:  Within Normal Limits   Alertness/Orientation:  Person, time place     yes   HEENT:  Eye Findings: Vitreous Hemoorhage with persistent layer overlying the Macula                   right eye  Neck: supple without masses  Chest/Lungs: clear to auscultation  Cardiac: Normal S1 and S2 without Murmur, S3 or S4  Neuro: non-focal    Impression: Subhyaloid Vitreous Hemorrhage OD, non-clearing  Planned Procedure:  Pars Plana Vitrectomy, Membrane Peeling, Endolaser     Lore Polka, MD        

## 2011-11-17 NOTE — Transfer of Care (Signed)
Immediate Anesthesia Transfer of Care Note  Patient: [name]  Procedure(s) Performed: Procedure(s) (LRB): PARS PLANA VITRECTOMY WITH 23 GAUGE (Right)  Patient Location: PACU  Anesthesia Type: General  Level of Consciousness: awake, alert  and oriented  Airway & Oxygen Therapy: Patient Spontanous Breathing and Patient connected to nasal cannula oxygen  Post-op Assessment: Report given to PACU RN and Post -op Vital signs reviewed and stable  Post vital signs: Reviewed and stable  Complications: No apparent anesthesia complications

## 2011-11-17 NOTE — Discharge Instructions (Signed)
Vyncent Overby      11/17/2011  Post-operative instructions for [place] L. Clarisa Kindred, MD  Caring for your eye:  Do not rub your eye and wash your hands before touching the eye area. This is important to avoid injury and infection.  You may use sterile gauze pads and sterile eye wash to cleanse the lid margins of mucous accumulation.  DO NOT REUSE GAUZE after wiping the eye. Use a new clean one if needed.  Be certain not to touch the top of the medication bottle to the eyelids to avoid contaminating your medicine bottle and causing infection.  After eye surgery the surface of the eye and eyelids may be puffy. You may note a red blotch(s) on the surface of the eye or a bruise on your eyelid. These are usually related to injections or instruments used in surgery and are not cause for alarm. You may also notice blood tinged tears on your eye pad, this is common and not cause for alarm. These findings will subside over the coming week or two.  Activity:  No jarring activities. Walking with assistance early on as needed is advised. Avoid straining and let me know if you have significant constipation. Do not bend over at the waist with the head below your waist to minimize risk of bleeding inside the eye.  Avoid getting water from washing your hair or showering  in your eye. Patch the eye if necessary during bathing to avoid contamination.    You may: watch television, work on you computer, read books, eat out, ride in a car.  Sleeping Position:    You do not have a gas bubble in your eye, you may sleep in your         customary manner.   Wear your eye shield for naps and sleeping at night for the first two weeks.   Wait a few minutes between your eye drops when placing them.   Resume your customary medications on your normal schedule. Shade Flood MD    After office hours I can be reached by calling: [phone]                                                                     or    [phone]

## 2011-11-17 NOTE — Op Note (Signed)
[name] 11/17/2011 Proliferative Diabetic Retinopathy Vitreous Hemorrhage   Procedure: Pars Plana Vitrectomy, Membrane Peeling and Endolaser Operative Eye:  right eye  Surgeon: Shade Flood Estimated Blood Loss: minimal Specimens for Pathology:  None Complications: none   The  patient was prepped and draped in the usual fashion for ocular surgery on the  right eye .  A solid lid speculum was placed. The conjunctiva was displaced with a cotton tipped applicator at the  7:30  meridian. A trocar/cannula was placed 3.5 mm from the surgical limbus. The cannula was visualized in the vitreous cavity. The infusion line was allowed to run and then clamped when placed at the cannula opening. The line was inserted and secured to the drape with an adhesive strip. Trocar/Cannulas were then placed at the 9:30 and 2:30 meridian. The light pipe and vitreous cutter were inserted into the vitreous cavity and the wide field lens was placed. Core vitrectomy was carried out uneventfully with care taken to remove the vitreous up to the vitreous base for 360 degrees.   The posterior hyaloid was attached which walled off his premacular hemorrhage. The vitreous cutter was placed on aspiration mode and used to separate the posterior hyaloid from it's multiple fibrovascular attachments. Once the hyaloid and small NVE were separated form the retina, the vitreous cutter was used to remove vitreous up to the vitreous base for 360 degrees. The silicone tipped aspiration cannula was used to aspirate the pre-retinal hemorrhage from the retinal surface.  Endolaser was applied to supplement prior PRP for a total of 396 pulses at power setting ( machine requires higher trhan normal setting  To get adequate treatment mark). 0.2 section duration was used.   The cannulas were removed from the 9:30 and 2:30 positions with concommitant tamponade using a cotton tipped applicator. Subconjunctival injections of Ancef 100mg /0.72ml  and Dexamethasone 4mg /50ml were placed in the infero-medial quadrant to avoid proximity to the cannula sites. The infusion pressure was decreased to 15mm hg.  The infusion cannula was removed with concomitant tamponade with the cotton tipped applicator leaving the ocular pressure less than 20 by palpation.  The speculum and drapes were removed and the eye was patched with Polymixin/Bacitracin ophthalmic ointment. An eye shield was placed and the patient was uneventfully extubated and  transferred with stable vital signs to the post operative recovery area.  Shade Flood MD

## 2011-11-17 NOTE — Interval H&P Note (Signed)
History and Physical Interval Note:  11/17/2011 7:53 AM  [name]  has presented today for surgery, with the diagnosis of vit hem, proliferative  The various methods of treatment have been discussed with the patient and family. After consideration of risks, benefits and other options for treatment, the patient has consented to  Procedure(s) (LRB): PARS PLANA VITRECTOMY WITH 23 GAUGE (Right) as a surgical intervention .  The patients' history has been reviewed, patient examined, no change in status, stable for surgery.  I have reviewed the patients' chart and labs.  Questions were answered to the patient's satisfaction.     Shade Flood, MD

## 2011-11-17 NOTE — Preoperative (Signed)
Beta Blockers   Reason not to administer Beta Blockers:Not Applicable 

## 2011-11-17 NOTE — Anesthesia Preprocedure Evaluation (Addendum)
Anesthesia Evaluation  Patient identified by MRN, date of birth, ID band Patient awake    Reviewed: Allergy & Precautions, H&P , NPO status , Patient's Chart, lab work & pertinent test results  History of Anesthesia Complications Negative for: history of anesthetic complications  Airway Mallampati: II TM Distance: >3 FB Neck ROM: full    Dental No notable dental hx. (+) Dental Advisory Given and Teeth Intact,    Pulmonary neg pulmonary ROS,  breath sounds clear to auscultation  Pulmonary exam normal       Cardiovascular Exercise Tolerance: Good negative cardio ROS  Rhythm:regular Rate:Normal     Neuro/Psych negative neurological ROS  negative psych ROS   GI/Hepatic negative GI ROS, Neg liver ROS,   Endo/Other  negative endocrine ROSDiabetes mellitus-, Well Controlled, Type 2, Insulin Dependent  Renal/GU negative Renal ROS  negative genitourinary   Musculoskeletal   Abdominal   Peds  Hematology negative hematology ROS (+)   Anesthesia Other Findings   Reproductive/Obstetrics negative OB ROS                         Anesthesia Physical Anesthesia Plan  ASA: III  Anesthesia Plan: General   Post-op Pain Management:    Induction: Intravenous  Airway Management Planned: Oral ETT  Additional Equipment:   Intra-op Plan:   Post-operative Plan: Extubation in OR  Informed Consent: I have reviewed the patients History and Physical, chart, labs and discussed the procedure including the risks, benefits and alternatives for the proposed anesthesia with the patient or authorized representative who has indicated his/her understanding and acceptance.   Dental Advisory Given  Plan Discussed with: CRNA and Surgeon  Anesthesia Plan Comments:         Anesthesia Quick Evaluation

## 2011-11-17 NOTE — Anesthesia Postprocedure Evaluation (Signed)
  Anesthesia Post-op Note  Patient: [name]  Procedure(s) Performed: Procedure(s) (LRB): PARS PLANA VITRECTOMY WITH 23 GAUGE (Right)  Patient Location: PACU  Anesthesia Type: General  Level of Consciousness: awake and alert   Airway and Oxygen Therapy: Patient Spontanous Breathing  Post-op Pain: mild  Post-op Assessment: Post-op Vital signs reviewed, Patient's Cardiovascular Status Stable, Respiratory Function Stable, Patent Airway and No signs of Nausea or vomiting  Post-op Vital Signs: stable  Complications: No apparent anesthesia complications

## 2011-11-18 ENCOUNTER — Encounter (HOSPITAL_COMMUNITY): Payer: Self-pay | Admitting: Ophthalmology

## 2014-06-16 ENCOUNTER — Encounter (HOSPITAL_COMMUNITY): Payer: Self-pay | Admitting: Emergency Medicine

## 2014-06-16 ENCOUNTER — Inpatient Hospital Stay (HOSPITAL_COMMUNITY)
Admission: EM | Admit: 2014-06-16 | Discharge: 2014-06-17 | DRG: 603 | Disposition: A | Discharge status: Home / Self Care | Payer: Medicaid Other | Attending: Emergency Medicine | Admitting: Emergency Medicine

## 2014-06-16 ENCOUNTER — Emergency Department (HOSPITAL_COMMUNITY): Payer: Medicaid Other

## 2014-06-16 DIAGNOSIS — Z89422 Acquired absence of other left toe(s): Secondary | ICD-10-CM

## 2014-06-16 DIAGNOSIS — T148XXA Other injury of unspecified body region, initial encounter: Secondary | ICD-10-CM

## 2014-06-16 DIAGNOSIS — Z87891 Personal history of nicotine dependence: Secondary | ICD-10-CM

## 2014-06-16 DIAGNOSIS — L02611 Cutaneous abscess of right foot: Principal | ICD-10-CM | POA: Diagnosis present

## 2014-06-16 DIAGNOSIS — R740 Nonspecific elevation of levels of transaminase and lactic acid dehydrogenase [LDH]: Secondary | ICD-10-CM

## 2014-06-16 DIAGNOSIS — I10 Essential (primary) hypertension: Secondary | ICD-10-CM | POA: Diagnosis present

## 2014-06-16 DIAGNOSIS — IMO0001 Reserved for inherently not codable concepts without codable children: Secondary | ICD-10-CM

## 2014-06-16 DIAGNOSIS — E1151 Type 2 diabetes mellitus with diabetic peripheral angiopathy without gangrene: Secondary | ICD-10-CM | POA: Diagnosis present

## 2014-06-16 DIAGNOSIS — T1490XA Injury, unspecified, initial encounter: Secondary | ICD-10-CM

## 2014-06-16 DIAGNOSIS — Z79899 Other long term (current) drug therapy: Secondary | ICD-10-CM

## 2014-06-16 DIAGNOSIS — Z794 Long term (current) use of insulin: Secondary | ICD-10-CM

## 2014-06-16 DIAGNOSIS — R7401 Elevation of levels of liver transaminase levels: Secondary | ICD-10-CM | POA: Diagnosis present

## 2014-06-16 DIAGNOSIS — S99921A Unspecified injury of right foot, initial encounter: Secondary | ICD-10-CM

## 2014-06-16 DIAGNOSIS — R52 Pain, unspecified: Secondary | ICD-10-CM

## 2014-06-16 DIAGNOSIS — M79676 Pain in unspecified toe(s): Secondary | ICD-10-CM | POA: Diagnosis present

## 2014-06-16 DIAGNOSIS — E119 Type 2 diabetes mellitus without complications: Secondary | ICD-10-CM | POA: Diagnosis present

## 2014-06-16 DIAGNOSIS — H539 Unspecified visual disturbance: Secondary | ICD-10-CM

## 2014-06-16 MED ORDER — METRONIDAZOLE IN NACL 5-0.79 MG/ML-% IV SOLN
500.0000 mg | Freq: Three times a day (TID) | INTRAVENOUS | Status: DC
  Administered 2014-06-17: 500 mg via INTRAVENOUS
  Filled 2014-06-16: qty 100

## 2014-06-16 MED ORDER — DEXTROSE 5 % IV SOLN
2.0000 g | INTRAVENOUS | Status: DC
  Administered 2014-06-17: 2 g via INTRAVENOUS
  Filled 2014-06-16: qty 2

## 2014-06-16 NOTE — ED Notes (Signed)
Pt. arrived with EMS from home reports right great toe injury 4 days ago accidentally  hit it against corner of bed with minimal bleeding .

## 2014-06-16 NOTE — ED Provider Notes (Signed)
CSN: [id number]     Arrival date & time 06/16/14  2217 History   None    Chief Complaint  Patient presents with  . Toe Injury   The history is provided by the patient and medical records. No language interpreter was used.  This chart was scribed for non-physician practitioner Dierdre Forth, PA-C,  working with Olivia Mackie, MD, by Andrew Au, ED Scribe. This patient was seen in room TR10C/TR10C and the patient's care was started at 11:40 PM.  [name] is a 54 y.o. male with hx of IDDM who presents to the Emergency Department complaining of a right great toe injury that occurred 4 days ago. 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  Pt reports he may have injured right great toe hitting it on the edge of the bed this week but he is unsure.. Pt states his sugars have been 103-105. Pt denies being in pain. Pt denies fever, chills, nausea and emesis.   Past Medical History  Diagnosis Date  . Diabetes mellitus     type 2  . Leg swelling   . Visual disturbance   . Blood transfusion 2009   Past Surgical History  Procedure Laterality Date  . Foot surgery  11/2007    small toe on left foot removed.  . Cholecystectomy  03/2011  . Nasal septum surgery  1981    Due to broken nose. Surgery was to repair how bone healed.  . Tonsillectomy    . Amputation  2012  . Pars plana vitrectomy  11/17/2011    Procedure: PARS PLANA VITRECTOMY WITH 23 GAUGE;  Surgeon: Shade Flood, MD;  Location: [facility] OR;  Service: Ophthalmology;  Laterality: Right;  With Endolaser   Family History  Problem Relation Age of Onset  . Cancer Mother     breast   History  Substance Use Topics  . Smoking status: Former Smoker    Quit date: 03/25/1979  . Smokeless tobacco: Never Used  . Alcohol Use: 0.6 oz/week    1 Shots of liquor per week     Comment: occasional    Review of Systems  Constitutional: Negative  for fever, chills, diaphoresis, appetite change, fatigue and unexpected weight change.  HENT: Negative for mouth sores.   Eyes: Negative for visual disturbance.  Respiratory: Negative for cough, chest tightness, shortness of breath and wheezing.   Cardiovascular: Negative for chest pain.  Gastrointestinal: Negative for nausea, vomiting, abdominal pain, diarrhea and constipation.  Endocrine: Negative for polydipsia, polyphagia and polyuria.  Genitourinary: Negative for dysuria, urgency, frequency and hematuria.  Musculoskeletal: Positive for myalgias (Right great toe), joint swelling (Right great toe) and arthralgias (right great toe). Negative for back pain and neck stiffness.  Skin: Negative for rash.  Allergic/Immunologic: Negative for immunocompromised state.  Neurological: Negative for syncope, light-headedness and headaches.  Hematological: Does not bruise/bleed easily.  Psychiatric/Behavioral: Negative for sleep disturbance. The patient is not nervous/anxious.     Allergies  Review of patient's allergies indicates no known allergies.  Home Medications   Prior to Admission medications   Medication Sig Start Date End Date Taking? Authorizing Provider  aspirin 81 MG tablet Take 81 mg by mouth daily.      Historical Provider, MD  furosemide (LASIX) 40 MG tablet Take 40 mg by mouth daily.    Historical Provider, MD  insulin glargine (LANTUS) 100 UNIT/ML injection  Inject 18 Units into the skin at bedtime.    Historical Provider, MD  potassium chloride (K-DUR) 10 MEQ tablet Take 10 mEq by mouth daily.    Historical Provider, MD   BP 125/53 mmHg  Pulse 79  Temp(Src) 98.1 F (36.7 C) (Oral)  Resp 14  Ht  (1.753 m)  Wt 275 lb (124.739 kg)  BMI 40.59 kg/m2  SpO2 100% Physical Exam  Constitutional: He appears well-developed and well-nourished. No distress.  Awake, alert, nontoxic appearance  HENT:  Head: Normocephalic and atraumatic.  Mouth/Throat: Oropharynx is clear and  moist. No oropharyngeal exudate.  Eyes: Conjunctivae are normal. No scleral icterus.  Neck: Normal range of motion. Neck supple.  Cardiovascular: Normal rate, regular rhythm, normal heart sounds and intact distal pulses.   No murmur heard. Pulmonary/Chest: Effort normal and breath sounds normal. No respiratory distress. He has no wheezes.  Equal chest expansion  Abdominal: Soft. Bowel sounds are normal. He exhibits no mass. There is no tenderness. There is no rebound and no guarding.  Musculoskeletal: Normal range of motion. He exhibits no edema.  Right great toe swollen, erythematous, hot to touch with evidence of large abscess and wet gangrene Erythema and increased warmth streaking up the dorsum of the foot and into the ankle Full range of motion of the right ankle without pain or difficulty, no range of motion of the right great toe  Neurological: He is alert. He exhibits normal muscle tone. Coordination normal.  Speech is clear and goal oriented Moves extremities without ataxia  Skin: Skin is warm and dry. He is not diaphoretic. There is erythema.  Psychiatric: He has a normal mood and affect.  Nursing note and vitals reviewed.   ED Course  Procedures (including critical care time) DIAGNOSTIC STUDIES: Oxygen Saturation is 100% on RA, normal by my interpretation.    COORDINATION OF CARE: 11:53 PM- Pt advised of plan for treatment and pt agrees.  Labs Review Labs Reviewed  CULTURE, BLOOD (ROUTINE X 2)  CULTURE, BLOOD (ROUTINE X 2)  CBC WITH DIFFERENTIAL  COMPREHENSIVE METABOLIC PANEL  SEDIMENTATION RATE  HEMOGLOBIN A1C  HIV ANTIBODY (ROUTINE TESTING)  C-REACTIVE PROTEIN  CBG MONITORING, ED    Imaging Review Dg Toe Great Right  06/16/2014   CLINICAL DATA:  Under side of the right first toe is blue today. No injury. History of recurrent infections from the toenail requiring surgical removal. History of diabetes.  EXAM: RIGHT GREAT TOE  COMPARISON:  None.  FINDINGS:  Degenerative changes in the interphalangeal and metatarsal-phalangeal joints of the right first toe. No evidence of acute fracture or dislocation. No focal bone sclerosis or cortical erosion. No soft tissue gas collections identified. Extensive vascular calcifications.  IMPRESSION: Degenerative changes in the right first toe. No acute bony abnormalities. Vascular calcifications.   Electronically Signed   By: Burman Nieves M.D.   On: 06/16/2014 23:38     EKG Interpretation None      MDM   Final diagnoses:  IDDM (insulin dependent diabetes mellitus)  Toe infection   [name] presents with right great toe pain and infection.  Patient is an insulin-dependent diabetic and toe appears infected and likely gangrenous, concern for osteomyelitis.  Patient is alert, oriented, nontoxic and nonseptic appearing. Labs and x-rays ordered.  Patient discussed with and care transferred to [facility], PA-C and Marisa Severin, MD who will follow labs and admit patient.     BP 125/53 mmHg  Pulse 79  Temp(Src) 98.1 F (36.7 C) (  Oral)  Resp 14  Ht  (1.753 m)  Wt 275 lb (124.739 kg)  BMI 40.59 kg/m2  SpO2 100%   I personally performed the services described in this documentation, which was scribed in my presence. The recorded information has been reviewed and is accurate.    Dahlia Client Donata Reddick, PA-C 06/16/14 2353  Olivia Mackie, MD 06/17/14 [phone]

## 2014-06-17 DIAGNOSIS — M79676 Pain in unspecified toe(s): Secondary | ICD-10-CM | POA: Diagnosis present

## 2014-06-17 DIAGNOSIS — I10 Essential (primary) hypertension: Secondary | ICD-10-CM | POA: Diagnosis present

## 2014-06-17 DIAGNOSIS — H539 Unspecified visual disturbance: Secondary | ICD-10-CM | POA: Insufficient documentation

## 2014-06-17 DIAGNOSIS — Z87891 Personal history of nicotine dependence: Secondary | ICD-10-CM | POA: Diagnosis not present

## 2014-06-17 DIAGNOSIS — E119 Type 2 diabetes mellitus without complications: Secondary | ICD-10-CM | POA: Diagnosis present

## 2014-06-17 DIAGNOSIS — Z79899 Other long term (current) drug therapy: Secondary | ICD-10-CM | POA: Diagnosis not present

## 2014-06-17 DIAGNOSIS — E1151 Type 2 diabetes mellitus with diabetic peripheral angiopathy without gangrene: Secondary | ICD-10-CM | POA: Diagnosis present

## 2014-06-17 DIAGNOSIS — L02611 Cutaneous abscess of right foot: Secondary | ICD-10-CM | POA: Diagnosis not present

## 2014-06-17 DIAGNOSIS — R740 Nonspecific elevation of levels of transaminase and lactic acid dehydrogenase [LDH]: Secondary | ICD-10-CM

## 2014-06-17 DIAGNOSIS — M79674 Pain in right toe(s): Secondary | ICD-10-CM | POA: Diagnosis not present

## 2014-06-17 DIAGNOSIS — Z89422 Acquired absence of other left toe(s): Secondary | ICD-10-CM | POA: Diagnosis not present

## 2014-06-17 DIAGNOSIS — R7401 Elevation of levels of liver transaminase levels: Secondary | ICD-10-CM | POA: Diagnosis present

## 2014-06-17 DIAGNOSIS — Z794 Long term (current) use of insulin: Secondary | ICD-10-CM | POA: Diagnosis not present

## 2014-06-17 LAB — CBC WITH DIFFERENTIAL/PLATELET
BASOS PCT: 0 % (ref 0–1)
Basophils Absolute: 0 10*3/uL (ref 0.0–0.1)
EOS ABS: 0.5 10*3/uL (ref 0.0–0.7)
EOS PCT: 5 % (ref 0–5)
HCT: 38.5 % — ABNORMAL LOW (ref 39.0–52.0)
HEMOGLOBIN: 12.2 g/dL — AB (ref 13.0–17.0)
LYMPHS PCT: 16 % (ref 12–46)
Lymphs Abs: 1.6 10*3/uL (ref 0.7–4.0)
MCH: 25.7 pg — ABNORMAL LOW (ref 26.0–34.0)
MCHC: 31.7 g/dL (ref 30.0–36.0)
MCV: 81.2 fL (ref 78.0–100.0)
MONOS PCT: 9 % (ref 3–12)
Monocytes Absolute: 0.9 10*3/uL (ref 0.1–1.0)
NEUTROS PCT: 70 % (ref 43–77)
Neutro Abs: 6.7 10*3/uL (ref 1.7–7.7)
Platelets: 243 10*3/uL (ref 150–400)
RBC: 4.74 MIL/uL (ref 4.22–5.81)
RDW: 14.2 % (ref 11.5–15.5)
WBC: 9.7 10*3/uL (ref 4.0–10.5)

## 2014-06-17 LAB — URINALYSIS, ROUTINE W REFLEX MICROSCOPIC
Bilirubin Urine: NEGATIVE
GLUCOSE, UA: 250 mg/dL — AB
Hgb urine dipstick: NEGATIVE
Ketones, ur: NEGATIVE mg/dL
Leukocytes, UA: NEGATIVE
NITRITE: NEGATIVE
PH: 6 (ref 5.0–8.0)
SPECIFIC GRAVITY, URINE: 1.024 (ref 1.005–1.030)
UROBILINOGEN UA: 1 mg/dL (ref 0.0–1.0)

## 2014-06-17 LAB — COMPREHENSIVE METABOLIC PANEL
ALK PHOS: 164 U/L — AB (ref 39–117)
ALT: 340 U/L — AB (ref 0–53)
AST: 181 U/L — ABNORMAL HIGH (ref 0–37)
Albumin: 3.6 g/dL (ref 3.5–5.2)
Anion gap: 9 (ref 5–15)
BUN: 26 mg/dL — ABNORMAL HIGH (ref 6–23)
CO2: 26 mmol/L (ref 19–32)
Calcium: 9.2 mg/dL (ref 8.4–10.5)
Chloride: 104 mEq/L (ref 96–112)
Creatinine, Ser: 1.16 mg/dL (ref 0.50–1.35)
GFR calc non Af Amer: 70 mL/min — ABNORMAL LOW (ref >90)
GFR, EST AFRICAN AMERICAN: 81 mL/min — AB (ref >90)
GLUCOSE: 147 mg/dL — AB (ref 70–99)
POTASSIUM: 4.5 mmol/L (ref 3.5–5.1)
SODIUM: 139 mmol/L (ref 135–145)
TOTAL PROTEIN: 7.5 g/dL (ref 6.0–8.3)
Total Bilirubin: 1.4 mg/dL — ABNORMAL HIGH (ref 0.3–1.2)

## 2014-06-17 LAB — URINE MICROSCOPIC-ADD ON

## 2014-06-17 LAB — CBG MONITORING, ED: Glucose-Capillary: 132 mg/dL — ABNORMAL HIGH (ref 70–99)

## 2014-06-17 LAB — SEDIMENTATION RATE: Sed Rate: 45 mm/hr — ABNORMAL HIGH (ref 0–16)

## 2014-06-17 MED ORDER — LIDOCAINE HCL (PF) 1 % IJ SOLN
5.0000 mL | Freq: Once | INTRAMUSCULAR | Status: AC
  Administered 2014-06-17: 5 mL
  Filled 2014-06-17: qty 5

## 2014-06-17 MED ORDER — CEPHALEXIN 500 MG PO CAPS: 500.0000 mg | ORAL_CAPSULE | Freq: Four times a day (QID) | ORAL | Status: DC

## 2014-06-17 MED ORDER — SULFAMETHOXAZOLE-TRIMETHOPRIM 800-160 MG PO TABS: 1.0000 | ORAL_TABLET | Freq: Two times a day (BID) | ORAL | Status: AC

## 2014-06-17 NOTE — ED Notes (Signed)
PA at BS.  

## 2014-06-17 NOTE — Discharge Instructions (Signed)
Recommend warm soaks to promote drainage. Take the antibiotics prescribed to cover for infection. Wear a post op shoe for comfort. Change your dressing at least once per day. Follow up with Dr. Geanie Cooley of this coming week.  Blisters Blisters are fluid-filled sacs that form within the skin. Common causes of blistering are friction, burns, and exposure to irritating chemicals. The fluid in the blister protects the underlying damaged skin. Most of the time it is not recommended that you open blisters. When a blister is opened, there is an increased chance for infection. Usually, a blister will open on its own. They then dry up and peel off within 10 days. If the blister is tense and uncomfortable (painful) the fluid may be drained. If it is drained the roof of the blister should be left intact. The draining should only be done by a medical professional under aseptic conditions. Poorly fitting shoes and boots can cause blisters by being too tight or too loose. Wearing extra socks or using tape, bandages, or pads over the blister-prone area helps prevent the problem by reducing friction. Blisters heal more slowly if you have diabetes or if you have problems with your circulation. You need to be careful about medical follow-up to prevent infection. HOME CARE INSTRUCTIONS  Protect areas where blisters have formed until the skin is healed. Use a special bandage with a hole cut in the middle around the blister. This reduces pressure and friction. When the blister breaks, trim off the loose skin and keep the area clean by washing it with soap daily. Soaking the blister or broken-open blister with diluted vinegar twice daily for 15 minutes will dry it up and speed the healing. Use 3 tablespoons of white vinegar per quart of water (45 mL white vinegar per liter of water). An antibiotic ointment and a bandage can be used to cover the area after soaking.  SEEK MEDICAL CARE IF:   You develop increased redness,  pain, swelling, or drainage in the blistered area.  You develop a pus-like discharge from the blistered area, chills, or a fever. MAKE SURE YOU:   Understand these instructions.  Will watch your condition.  Will get help right away if you are not doing well or get worse. Document Released: 07/10/2004 Document Revised: 08/25/2011 Document Reviewed: 06/07/2008 [facility] Patient Information 2015 [place], [place]. This information is not intended to replace advice given to you by your health care provider. Make sure you discuss any questions you have with your health care provider.

## 2014-06-17 NOTE — ED Provider Notes (Signed)
41 - Patient care assumed from [facility], PA-C at shift change. Patient presenting for toe pain after hitting it on the edge of his bed this week. Patient neurovascularly intact. He appears to have an abscess and cellulitis to his R great toe with pus tracking proximally down the lateral aspect of the great toe. Black area c/w bloody collection, likely secondary to trauma. No active drainage. There is mild cellulitis to the dorsal surface of the toe. TTP to the 1st MTP joint.  0230 - Dr. Lajoyce Corners consulted at 0100. He is unable to view imaging input into chart to differentiate between abscess/infection and gangrene. He states he will see the patient in AM if admitted to [facility]. Patient admitted to [facility] for further monitoring and IV abx for concern for deep space infection with abscess.  5366 - Dr. Clyde Lundborg evaluated the patient. He does not believe the patient's symptoms represent gangrene or worsening infection. Agrees that discoloration is likely bloody. Dr. Clyde Lundborg recommends I&D at bedside and d/c with orthopedic f/u as outpatient. Will proceed with this management. Dr. Lajoyce Corners did state he would be able to see the patient on Monday for evaluation. I&D orders placed.  105 - I&D perfomed with copious bloody discharge mixed with scant purulence and clots. Patient dressed with gauze dressing and given post op shoe. Discussed need for warm soaks. Patient to f/u with Dr. Lajoyce Corners this coming week.   INCISION AND DRAINAGE Performed by: Antony Madura Consent: Verbal consent obtained. Risks and benefits: risks, benefits and alternatives were discussed Type: abscess  Body area: R great toe  Anesthesia: local infiltration  Incision was made with a scalpel.  Local anesthetic: none  Anesthetic total: n/a  Complexity: complex Blunt dissection to break up loculations  Drainage: bloody and purulent  Drainage amount: moderate  Packing material: none  Patient tolerance: Patient tolerated the procedure  well with no immediate complications.   Dg Toe Great Right  06/16/2014   CLINICAL DATA:  Under side of the right first toe is blue today. No injury. History of recurrent infections from the toenail requiring surgical removal. History of diabetes.  EXAM: RIGHT GREAT TOE  COMPARISON:  None.  FINDINGS: Degenerative changes in the interphalangeal and metatarsal-phalangeal joints of the right first toe. No evidence of acute fracture or dislocation. No focal bone sclerosis or cortical erosion. No soft tissue gas collections identified. Extensive vascular calcifications.  IMPRESSION: Degenerative changes in the right first toe. No acute bony abnormalities. Vascular calcifications.   Electronically Signed   By: Burman Nieves M.D.   On: 06/16/2014 23:38        Antony Madura, PA-C 06/17/14 0250  Antony Madura, PA-C 06/17/14 0505  Olivia Mackie, MD 06/17/14 [phone]

## 2014-06-18 LAB — C-REACTIVE PROTEIN: CRP: 1.5 mg/dL — ABNORMAL HIGH (ref <0.60)

## 2014-06-18 LAB — HIV ANTIBODY (ROUTINE TESTING W REFLEX): HIV: NONREACTIVE

## 2014-06-18 LAB — HEMOGLOBIN A1C
Hgb A1c MFr Bld: 6.8 % — ABNORMAL HIGH (ref <5.7)
MEAN PLASMA GLUCOSE: 148 mg/dL — AB (ref <117)

## 2014-06-19 LAB — WOUND CULTURE

## 2014-06-21 LAB — GC/CHLAMYDIA PROBE AMP
CT PROBE, AMP APTIMA: NEGATIVE
GC Probe RNA: NEGATIVE

## 2014-06-23 LAB — CULTURE, BLOOD (ROUTINE X 2)
CULTURE: NO GROWTH
Culture: NO GROWTH

## 2015-04-12 ENCOUNTER — Other Ambulatory Visit (HOSPITAL_COMMUNITY): Payer: Self-pay | Admitting: Orthopedic Surgery

## 2015-04-18 ENCOUNTER — Encounter (HOSPITAL_COMMUNITY): Payer: Self-pay | Admitting: *Deleted

## 2015-04-18 NOTE — Progress Notes (Signed)
Pt denies cardiac history, chest pain or sob.   Echo - 2005 in EPIC  PCP is Dr. Concepcion ElkAvbuere. Positive Stop Bang Assessment Tool sent to Dr. Concepcion ElkAvbuere.  Pt is planning to take a cab home. When asked if he had a responsible adult that would be with him for the 24 hours after surgery, he stated "I won't need that". I informed him that yes, he would need someone with him, he states he'll get his sister to come over when he gets home.

## 2015-04-18 NOTE — Progress Notes (Signed)
   04/18/15 1254  OBSTRUCTIVE SLEEP APNEA  Have you ever been diagnosed with sleep apnea through a sleep study? No  Do you snore loudly (loud enough to be heard through closed doors)?  1  Do you often feel tired, fatigued, or sleepy during the daytime (such as falling asleep during driving or talking to someone)? 1  Has anyone observed you stop breathing during your sleep? 0  Do you have, or are you being treated for high blood pressure? 1  BMI more than 35 kg/m2? 1  Age > 50 (1-yes) 1  Neck circumference greater than:Male 16 inches or larger, Male 17inches or larger? 1  Male Gender (Yes=1) 1  Obstructive Sleep Apnea Score 7

## 2015-04-19 MED ORDER — CHLORHEXIDINE GLUCONATE 4 % EX LIQD: 60.0000 mL | Freq: Once | CUTANEOUS | Status: DC

## 2015-04-19 MED ORDER — DEXTROSE 5 % IV SOLN
3.0000 g | INTRAVENOUS | Status: AC
  Administered 2015-04-20: 3 g via INTRAVENOUS
  Filled 2015-04-19: qty 3000

## 2015-04-20 ENCOUNTER — Ambulatory Visit (HOSPITAL_COMMUNITY): Payer: Medicaid Other | Admitting: Certified Registered"

## 2015-04-20 ENCOUNTER — Encounter (HOSPITAL_COMMUNITY): Payer: Self-pay | Admitting: Certified Registered Nurse Anesthetist

## 2015-04-20 ENCOUNTER — Ambulatory Visit (HOSPITAL_COMMUNITY)
Admission: RE | Admit: 2015-04-20 | Discharge: 2015-04-20 | Disposition: A | Discharge status: Home / Self Care | Payer: Medicaid Other | Source: Ambulatory Visit | Attending: Orthopedic Surgery | Admitting: Orthopedic Surgery

## 2015-04-20 ENCOUNTER — Encounter (HOSPITAL_COMMUNITY)
Admission: RE | Disposition: A | Discharge status: Home / Self Care | Payer: Self-pay | Source: Ambulatory Visit | Attending: Orthopedic Surgery

## 2015-04-20 DIAGNOSIS — Z794 Long term (current) use of insulin: Secondary | ICD-10-CM | POA: Diagnosis not present

## 2015-04-20 DIAGNOSIS — E119 Type 2 diabetes mellitus without complications: Secondary | ICD-10-CM | POA: Diagnosis not present

## 2015-04-20 DIAGNOSIS — M869 Osteomyelitis, unspecified: Secondary | ICD-10-CM

## 2015-04-20 DIAGNOSIS — I10 Essential (primary) hypertension: Secondary | ICD-10-CM | POA: Insufficient documentation

## 2015-04-20 DIAGNOSIS — Z87891 Personal history of nicotine dependence: Secondary | ICD-10-CM | POA: Insufficient documentation

## 2015-04-20 DIAGNOSIS — M868X6 Other osteomyelitis, lower leg: Secondary | ICD-10-CM | POA: Insufficient documentation

## 2015-04-20 HISTORY — PX: AMPUTATION: SHX166

## 2015-04-20 HISTORY — DX: Essential (primary) hypertension: I10

## 2015-04-20 LAB — CBC
HCT: 39.6 % (ref 39.0–52.0)
HEMOGLOBIN: 12.4 g/dL — AB (ref 13.0–17.0)
MCH: 25.4 pg — ABNORMAL LOW (ref 26.0–34.0)
MCHC: 31.3 g/dL (ref 30.0–36.0)
MCV: 81.1 fL (ref 78.0–100.0)
PLATELETS: 234 10*3/uL (ref 150–400)
RBC: 4.88 MIL/uL (ref 4.22–5.81)
RDW: 13.7 % (ref 11.5–15.5)
WBC: 7.6 10*3/uL (ref 4.0–10.5)

## 2015-04-20 LAB — COMPREHENSIVE METABOLIC PANEL
ALBUMIN: 3.3 g/dL — AB (ref 3.5–5.0)
ALT: 18 U/L (ref 17–63)
AST: 20 U/L (ref 15–41)
Alkaline Phosphatase: 59 U/L (ref 38–126)
Anion gap: 6 (ref 5–15)
BUN: 17 mg/dL (ref 6–20)
CHLORIDE: 105 mmol/L (ref 101–111)
CO2: 26 mmol/L (ref 22–32)
CREATININE: 1.17 mg/dL (ref 0.61–1.24)
Calcium: 9.4 mg/dL (ref 8.9–10.3)
GFR calc non Af Amer: >60 mL/min (ref >60)
GLUCOSE: 112 mg/dL — AB (ref 65–99)
Potassium: 4.5 mmol/L (ref 3.5–5.1)
SODIUM: 137 mmol/L (ref 135–145)
Total Bilirubin: 0.8 mg/dL (ref 0.3–1.2)
Total Protein: 6.9 g/dL (ref 6.5–8.1)

## 2015-04-20 LAB — GLUCOSE, CAPILLARY
GLUCOSE-CAPILLARY: 107 mg/dL — AB (ref 65–99)
GLUCOSE-CAPILLARY: 117 mg/dL — AB (ref 65–99)
Glucose-Capillary: 111 mg/dL — ABNORMAL HIGH (ref 65–99)

## 2015-04-20 LAB — APTT: APTT: 27 s (ref 24–37)

## 2015-04-20 LAB — PROTIME-INR
INR: 1.16 (ref 0.00–1.49)
Prothrombin Time: 14.9 seconds (ref 11.6–15.2)

## 2015-04-20 SURGERY — AMPUTATION DIGIT
Anesthesia: Monitor Anesthesia Care | Laterality: Right

## 2015-04-20 MED ORDER — ONDANSETRON HCL 4 MG/2ML IJ SOLN
INTRAMUSCULAR | Status: AC
  Filled 2015-04-20: qty 2

## 2015-04-20 MED ORDER — BUPIVACAINE-EPINEPHRINE (PF) 0.5% -1:200000 IJ SOLN
INTRAMUSCULAR | Status: DC | PRN
  Administered 2015-04-20: 15 mL via PERINEURAL

## 2015-04-20 MED ORDER — FENTANYL CITRATE (PF) 100 MCG/2ML IJ SOLN
INTRAMUSCULAR | Status: AC
  Filled 2015-04-20: qty 2

## 2015-04-20 MED ORDER — LIDOCAINE-EPINEPHRINE (PF) 2 %-1:200000 IJ SOLN
INTRAMUSCULAR | Status: DC | PRN
  Administered 2015-04-20: 15 mL via PERINEURAL

## 2015-04-20 MED ORDER — MIDAZOLAM HCL 2 MG/2ML IJ SOLN
INTRAMUSCULAR | Status: AC
  Filled 2015-04-20: qty 2

## 2015-04-20 MED ORDER — PROPOFOL 10 MG/ML IV BOLUS
INTRAVENOUS | Status: AC
  Filled 2015-04-20: qty 20

## 2015-04-20 MED ORDER — MIDAZOLAM HCL 2 MG/2ML IJ SOLN
INTRAMUSCULAR | Status: AC
  Filled 2015-04-20: qty 4

## 2015-04-20 MED ORDER — FENTANYL CITRATE (PF) 250 MCG/5ML IJ SOLN
INTRAMUSCULAR | Status: AC
  Filled 2015-04-20: qty 5

## 2015-04-20 MED ORDER — LACTATED RINGERS IV SOLN
INTRAVENOUS | Status: DC
  Administered 2015-04-20: 14:00:00 via INTRAVENOUS

## 2015-04-20 MED ORDER — LIDOCAINE HCL (CARDIAC) 20 MG/ML IV SOLN
INTRAVENOUS | Status: AC
  Filled 2015-04-20: qty 5

## 2015-04-20 MED ORDER — MIDAZOLAM HCL 2 MG/2ML IJ SOLN
INTRAMUSCULAR | Status: AC
  Administered 2015-04-20: 1 mg
  Filled 2015-04-20: qty 2

## 2015-04-20 MED ORDER — OXYCODONE HCL 5 MG/5ML PO SOLN: 5.0000 mg | Freq: Once | ORAL | Status: DC | PRN

## 2015-04-20 MED ORDER — OXYCODONE HCL 5 MG PO TABS: 5.0000 mg | ORAL_TABLET | Freq: Once | ORAL | Status: DC | PRN

## 2015-04-20 MED ORDER — HYDROMORPHONE HCL 1 MG/ML IJ SOLN: 0.2500 mg | INTRAMUSCULAR | Status: DC | PRN

## 2015-04-20 MED ORDER — 0.9 % SODIUM CHLORIDE (POUR BTL) OPTIME
TOPICAL | Status: DC | PRN
  Administered 2015-04-20: 1000 mL

## 2015-04-20 MED ORDER — PHENYLEPHRINE 40 MCG/ML (10ML) SYRINGE FOR IV PUSH (FOR BLOOD PRESSURE SUPPORT)
PREFILLED_SYRINGE | INTRAVENOUS | Status: AC
  Filled 2015-04-20: qty 10

## 2015-04-20 MED ORDER — PROMETHAZINE HCL 25 MG/ML IJ SOLN: 6.2500 mg | INTRAMUSCULAR | Status: DC | PRN

## 2015-04-20 SURGICAL SUPPLY — 30 items
BLADE SURG 21 STRL SS (BLADE) ×2 IMPLANT
BNDG COHESIVE 4X5 TAN STRL (GAUZE/BANDAGES/DRESSINGS) ×2 IMPLANT
BNDG ESMARK 4X9 LF (GAUZE/BANDAGES/DRESSINGS) IMPLANT
BNDG GAUZE ELAST 4 BULKY (GAUZE/BANDAGES/DRESSINGS) ×2 IMPLANT
COVER SURGICAL LIGHT HANDLE (MISCELLANEOUS) ×4 IMPLANT
DRAPE U-SHAPE 47X51 STRL (DRAPES) ×2 IMPLANT
DRSG ADAPTIC 3X8 NADH LF (GAUZE/BANDAGES/DRESSINGS) IMPLANT
DRSG PAD ABDOMINAL 8X10 ST (GAUZE/BANDAGES/DRESSINGS) ×2 IMPLANT
DURAPREP 26ML APPLICATOR (WOUND CARE) ×2 IMPLANT
ELECT REM PT RETURN 9FT ADLT (ELECTROSURGICAL) ×2
ELECTRODE REM PT RTRN 9FT ADLT (ELECTROSURGICAL) ×1 IMPLANT
GAUZE SPONGE 4X4 12PLY STRL (GAUZE/BANDAGES/DRESSINGS) IMPLANT
GLOVE BIOGEL PI IND STRL 9 (GLOVE) ×1 IMPLANT
GLOVE BIOGEL PI INDICATOR 9 (GLOVE) ×1
GLOVE SURG ORTHO 9.0 STRL STRW (GLOVE) ×2 IMPLANT
GOWN STRL REUS W/ TWL XL LVL3 (GOWN DISPOSABLE) ×2 IMPLANT
GOWN STRL REUS W/TWL XL LVL3 (GOWN DISPOSABLE) ×2
KIT BASIN OR (CUSTOM PROCEDURE TRAY) ×2 IMPLANT
KIT ROOM TURNOVER OR (KITS) ×2 IMPLANT
MANIFOLD NEPTUNE II (INSTRUMENTS) ×2 IMPLANT
NEEDLE 22X1 1/2 (OR ONLY) (NEEDLE) IMPLANT
NS IRRIG 1000ML POUR BTL (IV SOLUTION) ×2 IMPLANT
PACK ORTHO EXTREMITY (CUSTOM PROCEDURE TRAY) ×2 IMPLANT
PAD ARMBOARD 7.5X6 YLW CONV (MISCELLANEOUS) ×4 IMPLANT
SPONGE GAUZE 4X4 12PLY STER LF (GAUZE/BANDAGES/DRESSINGS) ×2 IMPLANT
SUCTION FRAZIER TIP 10 FR DISP (SUCTIONS) IMPLANT
SUT ETHILON 2 0 PSLX (SUTURE) ×2 IMPLANT
SYR CONTROL 10ML LL (SYRINGE) IMPLANT
TOWEL OR 17X24 6PK STRL BLUE (TOWEL DISPOSABLE) ×2 IMPLANT
TOWEL OR 17X26 10 PK STRL BLUE (TOWEL DISPOSABLE) ×2 IMPLANT

## 2015-04-20 NOTE — Op Note (Signed)
04/20/2015  3:43 PM  PATIENT:  [name]    PRE-OPERATIVE DIAGNOSIS:  Osteomyelitis Right Great Toe  POST-OPERATIVE DIAGNOSIS:  Same  PROCEDURE:  Right Great Toe Amputation  SURGEON:  Nadara MustardUDA,Brier Firebaugh V, MD  PHYSICIAN ASSISTANT:None ANESTHESIA:   General  PREOPERATIVE INDICATIONS:  [name] is a  54 y.o. male with a diagnosis of Osteomyelitis Right Great Toe who failed conservative measures and elected for surgical management.    The risks benefits and alternatives were discussed with the patient preoperatively including but not limited to the risks of infection, bleeding, nerve injury, cardiopulmonary complications, the need for revision surgery, among others, and the patient was willing to proceed.  OPERATIVE IMPLANTS: None  OPERATIVE FINDINGS: Minimal petechial bleeding  OPERATIVE PROCEDURE: Patient was brought to the operating room after undergoing a popliteal block. After adequate levels anesthesia were obtained patient's right lower extremity was prepped using DuraPrep draped into a sterile field. A timeout was called. A fishmouth incision was made just distal to the MTP joint. The right toe was amputated through the MTP joint. Electrocautery was used for hemostasis. The wound was irrigated with normal saline. The wound was closed with 2-0 nylon and a sterile compressive dressing was applied. Patient was taken to the PACU in stable condition plan for discharge to home.

## 2015-04-20 NOTE — Transfer of Care (Signed)
Immediate Anesthesia Transfer of Care Note  Patient: Dan BarSamuel Sexton  Procedure(s) Performed: Procedure(s): Right Great Toe Amputation (Right)  Patient Location: PACU  Anesthesia Type:MAC and Regional  Level of Consciousness: awake, alert , oriented and patient cooperative  Airway & Oxygen Therapy: Patient Spontanous Breathing and Patient connected to nasal cannula oxygen  Post-op Assessment: Report given to RN and Post -op Vital signs reviewed and stable moves all extremities  Post vital signs: Reviewed and stable  Last Vitals:  Filed Vitals:   04/20/15 1440  BP: 136/58  Pulse: 61  Temp:   Resp: 16    Complications: No apparent anesthesia complications

## 2015-04-20 NOTE — H&P (Signed)
[name] is an 54 y.o. male.   Chief Complaint: Osteomyelitis abscess right great toe HPI: Patient is a 54 year old gentleman with diabetic insensate neuropathy who has failed conservative limb salvage of the right great toe.  Past Medical History  Diagnosis Date  . Diabetes mellitus     type 2  . Leg swelling   . Visual disturbance   . Blood transfusion 2009  . Hypertension     Past Surgical History  Procedure Laterality Date  . Foot surgery  11/2007    small toe on left foot removed.  . Cholecystectomy  03/2011  . Nasal septum surgery  1981    Due to broken nose. Surgery was to repair how bone healed.  . Tonsillectomy    . Amputation  2012  . Pars plana vitrectomy  11/17/2011    Procedure: PARS PLANA VITRECTOMY WITH 23 GAUGE;  Surgeon: Shade FloodGreer Geiger, MD;  [facility];  Service: Ophthalmology;  Laterality: Right;  With Endolaser    Family History  Problem Relation Age of Onset  . Cancer Mother     breast   Social History:  reports that he quit smoking about 36 years ago. His smoking use included Cigarettes. He has never used smokeless tobacco. He reports that he drinks about 0.6 oz of alcohol per week. He reports that he does not use illicit drugs.  Allergies: No Known Allergies  No prescriptions prior to admission    No results found for this or any previous visit (from the past 48 hour(s)). No results found.  Review of Systems  All other systems reviewed and are negative.   There were no vitals taken for this visit. Physical Exam  On examination patient has a palpable dorsalis pedis pulse he has swelling ulceration cellulitis and osteomyelitis of the right great toe. Assessment/Plan Assessment: Ulceration cellulitis ostium myelitis right great toe.  Plan: We'll plan for amputation of the right great toe risk and benefits were discussed including nonhealing the wound need for higher level amputation. Patient states he understands wishes to proceed at this  time.  [name] V 04/20/2015, 6:30 AM

## 2015-04-20 NOTE — Anesthesia Procedure Notes (Signed)
Anesthesia Regional Block:  Popliteal block  Pre-Anesthetic Checklist: ,, timeout performed, Correct Patient, Correct Site, Correct Laterality, Correct Procedure, Correct Position, site marked, Risks and benefits discussed,  Surgical consent,  Pre-op evaluation,  At surgeon's request and post-op pain management  Laterality: Right  Prep: chloraprep       Needles:  Injection technique: Single-shot  Needle Type: Echogenic Needle     Needle Length: 9cm 9 cm Needle Gauge: 21 and 21 G    Additional Needles:  Procedures: ultrasound guided (picture in chart) Popliteal block Narrative:  Start time: 04/20/2015 2:20 PM End time: 04/20/2015 2:30 PM Injection made incrementally with aspirations every 5 mL.  Performed by: Personally  Anesthesiologist: Marcene DuosFITZGERALD, Elis Sauber

## 2015-04-20 NOTE — Progress Notes (Signed)
Orthopedic Tech Progress Note Patient Details:  Aldona BarSamuel Gallicchio 04-Dec-1960 [id number]  Ortho Devices Type of Ortho Device: Postop shoe/boot Ortho Device/Splint Location: RLE Ortho Device/Splint Interventions: Ordered, Application   Jennye MoccasinHughes, Dawson Albers Craig 04/20/2015, 4:24 PM

## 2015-04-20 NOTE — Anesthesia Postprocedure Evaluation (Signed)
  Anesthesia Post-op Note  Patient: [name]  Procedure(s) Performed: Procedure(s): Right Great Toe Amputation (Right)  Patient Location: PACU  Anesthesia Type: MAC, Regional   Level of Consciousness: awake, alert  and oriented  Airway and Oxygen Therapy: Patient Spontanous Breathing  Post-op Pain: mild  Post-op Assessment: Post-op Vital signs reviewed  Post-op Vital Signs: Reviewed  Last Vitals:  Filed Vitals:   04/20/15 1602  BP:   Pulse: 69  Temp:   Resp: 17    Complications: No apparent anesthesia complications

## 2015-04-20 NOTE — Anesthesia Preprocedure Evaluation (Addendum)
Anesthesia Evaluation  Patient identified by MRN, date of birth, ID band Patient awake    Reviewed: Allergy & Precautions, NPO status , Patient's Chart, lab work & pertinent test results  Airway Mallampati: II  TM Distance: >3 FB Neck ROM: Full    Dental   Pulmonary former smoker (quit 1980),    breath sounds clear to auscultation       Cardiovascular hypertension, Pt. on medications + Peripheral Vascular Disease   Rhythm:Regular Rate:Normal     Neuro/Psych negative neurological ROS     GI/Hepatic negative GI ROS, Neg liver ROS,   Endo/Other  diabetes (glu 111), Insulin DependentMorbid obesity  Renal/GU negative Renal ROS     Musculoskeletal negative musculoskeletal ROS (+)   Abdominal   Peds  Hematology  (+) anemia ,   Anesthesia Other Findings   Reproductive/Obstetrics                            Lab Results  Component Value Date   WBC 7.6 04/20/2015   HGB 12.4* 04/20/2015   HCT 39.6 04/20/2015   MCV 81.1 04/20/2015   PLT 234 04/20/2015   Lab Results  Component Value Date   CREATININE 1.16 06/17/2014   BUN 26* 06/17/2014   NA 139 06/17/2014   K 4.5 06/17/2014   CL 104 06/17/2014   CO2 26 06/17/2014    Anesthesia Physical Anesthesia Plan  ASA: III  Anesthesia Plan: MAC and Regional   Post-op Pain Management:    Induction: Intravenous  Airway Management Planned: Natural Airway and Simple Face Mask  Additional Equipment:   Intra-op Plan:   Post-operative Plan:   Informed Consent: I have reviewed the patients History and Physical, chart, labs and discussed the procedure including the risks, benefits and alternatives for the proposed anesthesia with the patient or authorized representative who has indicated his/her understanding and acceptance.     Plan Discussed with: CRNA  Anesthesia Plan Comments:         Anesthesia Quick Evaluation

## 2015-04-23 ENCOUNTER — Encounter (HOSPITAL_COMMUNITY): Payer: Self-pay | Admitting: Orthopedic Surgery

## 2015-07-25 ENCOUNTER — Other Ambulatory Visit (HOSPITAL_COMMUNITY): Payer: Self-pay | Admitting: Orthopedic Surgery

## 2015-07-26 ENCOUNTER — Encounter (HOSPITAL_COMMUNITY): Payer: Self-pay | Admitting: *Deleted

## 2015-07-26 MED ORDER — CHLORHEXIDINE GLUCONATE 4 % EX LIQD: 60.0000 mL | Freq: Once | CUTANEOUS | Status: DC

## 2015-07-26 MED ORDER — DEXTROSE 5 % IV SOLN
3.0000 g | INTRAVENOUS | Status: AC
  Administered 2015-07-27: 3 g via INTRAVENOUS
  Filled 2015-07-26 (×3): qty 3000

## 2015-07-27 ENCOUNTER — Encounter (HOSPITAL_COMMUNITY): Payer: Self-pay | Admitting: *Deleted

## 2015-07-27 ENCOUNTER — Ambulatory Visit (HOSPITAL_COMMUNITY): Payer: Medicaid Other | Admitting: Anesthesiology

## 2015-07-27 ENCOUNTER — Ambulatory Visit (HOSPITAL_COMMUNITY)
Admission: RE | Admit: 2015-07-27 | Discharge: 2015-07-28 | Disposition: A | Discharge status: Home / Self Care | Payer: Medicaid Other | Source: Ambulatory Visit | Attending: Orthopedic Surgery | Admitting: Orthopedic Surgery

## 2015-07-27 ENCOUNTER — Encounter (HOSPITAL_COMMUNITY)
Admission: RE | Disposition: A | Discharge status: Home / Self Care | Payer: Self-pay | Source: Ambulatory Visit | Attending: Orthopedic Surgery

## 2015-07-27 DIAGNOSIS — Z87891 Personal history of nicotine dependence: Secondary | ICD-10-CM | POA: Insufficient documentation

## 2015-07-27 DIAGNOSIS — E11621 Type 2 diabetes mellitus with foot ulcer: Secondary | ICD-10-CM | POA: Diagnosis not present

## 2015-07-27 DIAGNOSIS — Z89412 Acquired absence of left great toe: Secondary | ICD-10-CM | POA: Diagnosis not present

## 2015-07-27 DIAGNOSIS — I10 Essential (primary) hypertension: Secondary | ICD-10-CM | POA: Diagnosis not present

## 2015-07-27 DIAGNOSIS — M869 Osteomyelitis, unspecified: Secondary | ICD-10-CM

## 2015-07-27 DIAGNOSIS — L97519 Non-pressure chronic ulcer of other part of right foot with unspecified severity: Secondary | ICD-10-CM | POA: Insufficient documentation

## 2015-07-27 DIAGNOSIS — Z7982 Long term (current) use of aspirin: Secondary | ICD-10-CM | POA: Insufficient documentation

## 2015-07-27 DIAGNOSIS — E1169 Type 2 diabetes mellitus with other specified complication: Secondary | ICD-10-CM | POA: Insufficient documentation

## 2015-07-27 DIAGNOSIS — E785 Hyperlipidemia, unspecified: Secondary | ICD-10-CM | POA: Insufficient documentation

## 2015-07-27 DIAGNOSIS — E1151 Type 2 diabetes mellitus with diabetic peripheral angiopathy without gangrene: Secondary | ICD-10-CM | POA: Insufficient documentation

## 2015-07-27 DIAGNOSIS — Z794 Long term (current) use of insulin: Secondary | ICD-10-CM | POA: Insufficient documentation

## 2015-07-27 DIAGNOSIS — Z79899 Other long term (current) drug therapy: Secondary | ICD-10-CM | POA: Diagnosis not present

## 2015-07-27 DIAGNOSIS — Z89411 Acquired absence of right great toe: Secondary | ICD-10-CM | POA: Diagnosis not present

## 2015-07-27 DIAGNOSIS — Z6841 Body Mass Index (BMI) 40.0 and over, adult: Secondary | ICD-10-CM | POA: Diagnosis not present

## 2015-07-27 DIAGNOSIS — S98131A Complete traumatic amputation of one right lesser toe, initial encounter: Secondary | ICD-10-CM

## 2015-07-27 HISTORY — PX: AMPUTATION: SHX166

## 2015-07-27 HISTORY — DX: Hyperlipidemia, unspecified: E78.5

## 2015-07-27 LAB — COMPREHENSIVE METABOLIC PANEL
ALBUMIN: 3.4 g/dL — AB (ref 3.5–5.0)
ALK PHOS: 65 U/L (ref 38–126)
ALT: 18 U/L (ref 17–63)
ANION GAP: 10 (ref 5–15)
AST: 18 U/L (ref 15–41)
BILIRUBIN TOTAL: 0.3 mg/dL (ref 0.3–1.2)
BUN: 24 mg/dL — ABNORMAL HIGH (ref 6–20)
CALCIUM: 9.2 mg/dL (ref 8.9–10.3)
CO2: 25 mmol/L (ref 22–32)
CREATININE: 1.15 mg/dL (ref 0.61–1.24)
Chloride: 106 mmol/L (ref 101–111)
GFR calc non Af Amer: >60 mL/min (ref >60)
GLUCOSE: 114 mg/dL — AB (ref 65–99)
Potassium: 4 mmol/L (ref 3.5–5.1)
SODIUM: 141 mmol/L (ref 135–145)
TOTAL PROTEIN: 7.1 g/dL (ref 6.5–8.1)

## 2015-07-27 LAB — GLUCOSE, CAPILLARY
GLUCOSE-CAPILLARY: 102 mg/dL — AB (ref 65–99)
Glucose-Capillary: 99 mg/dL (ref 65–99)
Glucose-Capillary: 107 mg/dL — ABNORMAL HIGH (ref 65–99)
Glucose-Capillary: 155 mg/dL — ABNORMAL HIGH (ref 65–99)

## 2015-07-27 LAB — CBC
HEMATOCRIT: 41.1 % (ref 39.0–52.0)
HEMOGLOBIN: 12.7 g/dL — AB (ref 13.0–17.0)
MCH: 25 pg — AB (ref 26.0–34.0)
MCHC: 30.9 g/dL (ref 30.0–36.0)
MCV: 80.9 fL (ref 78.0–100.0)
Platelets: 202 10*3/uL (ref 150–400)
RBC: 5.08 MIL/uL (ref 4.22–5.81)
RDW: 14 % (ref 11.5–15.5)
WBC: 8.9 10*3/uL (ref 4.0–10.5)

## 2015-07-27 LAB — APTT: aPTT: 27 seconds (ref 24–37)

## 2015-07-27 LAB — PROTIME-INR
INR: 1.14 (ref 0.00–1.49)
Prothrombin Time: 14.8 seconds (ref 11.6–15.2)

## 2015-07-27 SURGERY — AMPUTATION DIGIT
Anesthesia: Monitor Anesthesia Care | Laterality: Right

## 2015-07-27 MED ORDER — ONDANSETRON HCL 4 MG/2ML IJ SOLN: 4.0000 mg | Freq: Four times a day (QID) | INTRAMUSCULAR | Status: DC | PRN

## 2015-07-27 MED ORDER — MIDAZOLAM HCL 2 MG/2ML IJ SOLN
INTRAMUSCULAR | Status: AC
  Filled 2015-07-27: qty 2

## 2015-07-27 MED ORDER — OXYCODONE HCL 5 MG PO TABS
5.0000 mg | ORAL_TABLET | ORAL | Status: DC | PRN
Start: 2015-07-27 — End: 2015-07-28

## 2015-07-27 MED ORDER — METHOCARBAMOL 1000 MG/10ML IJ SOLN: 500.0000 mg | Freq: Four times a day (QID) | INTRAVENOUS | Status: DC | PRN

## 2015-07-27 MED ORDER — LIDOCAINE HCL (CARDIAC) 20 MG/ML IV SOLN
INTRAVENOUS | Status: DC | PRN
  Administered 2015-07-27: 60 mg via INTRAVENOUS

## 2015-07-27 MED ORDER — METOCLOPRAMIDE HCL 5 MG PO TABS: 5.0000 mg | ORAL_TABLET | Freq: Three times a day (TID) | ORAL | Status: DC | PRN

## 2015-07-27 MED ORDER — ONDANSETRON HCL 4 MG/2ML IJ SOLN
INTRAMUSCULAR | Status: DC | PRN
  Administered 2015-07-27: 4 mg via INTRAVENOUS

## 2015-07-27 MED ORDER — LISINOPRIL 5 MG PO TABS
5.0000 mg | ORAL_TABLET | Freq: Every day | ORAL | Status: DC
  Administered 2015-07-27: 5 mg via ORAL
  Filled 2015-07-27: qty 1

## 2015-07-27 MED ORDER — PROMETHAZINE HCL 25 MG/ML IJ SOLN: 6.2500 mg | INTRAMUSCULAR | Status: DC | PRN

## 2015-07-27 MED ORDER — ACETAMINOPHEN 325 MG PO TABS: 650.0000 mg | ORAL_TABLET | Freq: Four times a day (QID) | ORAL | Status: DC | PRN

## 2015-07-27 MED ORDER — INSULIN ASPART 100 UNIT/ML ~~LOC~~ SOLN: 0.0000 [IU] | Freq: Three times a day (TID) | SUBCUTANEOUS | Status: DC

## 2015-07-27 MED ORDER — POTASSIUM CHLORIDE CRYS ER 10 MEQ PO TBCR
10.0000 meq | EXTENDED_RELEASE_TABLET | Freq: Every day | ORAL | Status: DC
  Administered 2015-07-27: 10 meq via ORAL
  Filled 2015-07-27 (×2): qty 1

## 2015-07-27 MED ORDER — LIDOCAINE HCL (CARDIAC) 20 MG/ML IV SOLN
INTRAVENOUS | Status: AC
  Filled 2015-07-27: qty 5

## 2015-07-27 MED ORDER — METOCLOPRAMIDE HCL 5 MG/ML IJ SOLN: 5.0000 mg | Freq: Three times a day (TID) | INTRAMUSCULAR | Status: DC | PRN

## 2015-07-27 MED ORDER — CEFAZOLIN SODIUM-DEXTROSE 2-3 GM-% IV SOLR
2.0000 g | Freq: Four times a day (QID) | INTRAVENOUS | Status: DC
  Administered 2015-07-27 – 2015-07-28 (×2): 2 g via INTRAVENOUS
  Filled 2015-07-27 (×3): qty 50

## 2015-07-27 MED ORDER — HYDROMORPHONE HCL 1 MG/ML IJ SOLN: 1.0000 mg | INTRAMUSCULAR | Status: DC | PRN

## 2015-07-27 MED ORDER — ACETAMINOPHEN 650 MG RE SUPP: 650.0000 mg | Freq: Four times a day (QID) | RECTAL | Status: DC | PRN

## 2015-07-27 MED ORDER — ONDANSETRON HCL 4 MG PO TABS
4.0000 mg | ORAL_TABLET | Freq: Four times a day (QID) | ORAL | Status: DC | PRN
Start: 2015-07-27 — End: 2015-07-28

## 2015-07-27 MED ORDER — PROPOFOL 10 MG/ML IV BOLUS
INTRAVENOUS | Status: AC
  Filled 2015-07-27: qty 20

## 2015-07-27 MED ORDER — MIDAZOLAM HCL 5 MG/5ML IJ SOLN
INTRAMUSCULAR | Status: DC | PRN
  Administered 2015-07-27: 2 mg via INTRAVENOUS

## 2015-07-27 MED ORDER — INSULIN ASPART 100 UNIT/ML ~~LOC~~ SOLN: 4.0000 [IU] | Freq: Three times a day (TID) | SUBCUTANEOUS | Status: DC

## 2015-07-27 MED ORDER — INSULIN GLARGINE 100 UNIT/ML ~~LOC~~ SOLN
20.0000 [IU] | Freq: Every day | SUBCUTANEOUS | Status: DC
  Administered 2015-07-27: 20 [IU] via SUBCUTANEOUS
  Filled 2015-07-27 (×3): qty 0.2

## 2015-07-27 MED ORDER — FENTANYL CITRATE (PF) 250 MCG/5ML IJ SOLN
INTRAMUSCULAR | Status: AC
  Filled 2015-07-27: qty 5

## 2015-07-27 MED ORDER — METHOCARBAMOL 500 MG PO TABS: 500.0000 mg | ORAL_TABLET | Freq: Four times a day (QID) | ORAL | Status: DC | PRN

## 2015-07-27 MED ORDER — ASPIRIN 81 MG PO CHEW: 81.0000 mg | CHEWABLE_TABLET | Freq: Every day | ORAL | Status: DC

## 2015-07-27 MED ORDER — PROPOFOL 10 MG/ML IV BOLUS
INTRAVENOUS | Status: DC | PRN
  Administered 2015-07-27: 150 mg via INTRAVENOUS

## 2015-07-27 MED ORDER — FENTANYL CITRATE (PF) 100 MCG/2ML IJ SOLN: 25.0000 ug | INTRAMUSCULAR | Status: DC | PRN

## 2015-07-27 MED ORDER — LACTATED RINGERS IV SOLN
INTRAVENOUS | Status: DC
  Administered 2015-07-27: 12:00:00 via INTRAVENOUS

## 2015-07-27 MED ORDER — SODIUM CHLORIDE 0.9 % IV SOLN: INTRAVENOUS | Status: DC

## 2015-07-27 SURGICAL SUPPLY — 30 items
BLADE SURG 21 STRL SS (BLADE) ×2 IMPLANT
BNDG COHESIVE 4X5 TAN STRL (GAUZE/BANDAGES/DRESSINGS) ×2 IMPLANT
BNDG ESMARK 4X9 LF (GAUZE/BANDAGES/DRESSINGS) IMPLANT
BNDG GAUZE ELAST 4 BULKY (GAUZE/BANDAGES/DRESSINGS) ×2 IMPLANT
COVER SURGICAL LIGHT HANDLE (MISCELLANEOUS) ×4 IMPLANT
DRAPE U-SHAPE 47X51 STRL (DRAPES) ×2 IMPLANT
DRSG ADAPTIC 3X8 NADH LF (GAUZE/BANDAGES/DRESSINGS) ×2 IMPLANT
DRSG PAD ABDOMINAL 8X10 ST (GAUZE/BANDAGES/DRESSINGS) ×2 IMPLANT
DURAPREP 26ML APPLICATOR (WOUND CARE) ×2 IMPLANT
ELECT REM PT RETURN 9FT ADLT (ELECTROSURGICAL) ×2
ELECTRODE REM PT RTRN 9FT ADLT (ELECTROSURGICAL) ×1 IMPLANT
GAUZE SPONGE 4X4 12PLY STRL (GAUZE/BANDAGES/DRESSINGS) ×2 IMPLANT
GLOVE BIOGEL PI IND STRL 9 (GLOVE) ×1 IMPLANT
GLOVE BIOGEL PI INDICATOR 9 (GLOVE) ×1
GLOVE SURG ORTHO 9.0 STRL STRW (GLOVE) ×2 IMPLANT
GOWN STRL REUS W/ TWL XL LVL3 (GOWN DISPOSABLE) ×2 IMPLANT
GOWN STRL REUS W/TWL XL LVL3 (GOWN DISPOSABLE) ×2
KIT BASIN OR (CUSTOM PROCEDURE TRAY) ×2 IMPLANT
KIT ROOM TURNOVER OR (KITS) ×2 IMPLANT
MANIFOLD NEPTUNE II (INSTRUMENTS) ×2 IMPLANT
NEEDLE 22X1 1/2 (OR ONLY) (NEEDLE) IMPLANT
NS IRRIG 1000ML POUR BTL (IV SOLUTION) ×2 IMPLANT
PACK ORTHO EXTREMITY (CUSTOM PROCEDURE TRAY) ×2 IMPLANT
PAD ARMBOARD 7.5X6 YLW CONV (MISCELLANEOUS) ×4 IMPLANT
SUCTION FRAZIER HANDLE 10FR (MISCELLANEOUS)
SUCTION TUBE FRAZIER 10FR DISP (MISCELLANEOUS) IMPLANT
SUT ETHILON 2 0 PSLX (SUTURE) ×2 IMPLANT
SYR CONTROL 10ML LL (SYRINGE) IMPLANT
TOWEL OR 17X24 6PK STRL BLUE (TOWEL DISPOSABLE) ×2 IMPLANT
TOWEL OR 17X26 10 PK STRL BLUE (TOWEL DISPOSABLE) ×2 IMPLANT

## 2015-07-27 NOTE — Progress Notes (Signed)
Orthopedic Tech Progress Note Patient Details:  [name] Nov 05, 1960 [id number]  Ortho Devices Type of Ortho Device: Postop shoe/boot Ortho Device/Splint Location: RLE Ortho Device/Splint Interventions: Ordered, Application   Jennye Moccasin 07/27/2015, 3:39 PM

## 2015-07-27 NOTE — Anesthesia Preprocedure Evaluation (Addendum)
Anesthesia Evaluation  Patient identified by MRN, date of birth, ID band Patient awake    Reviewed: Allergy & Precautions, NPO status , Patient's Chart, lab work & pertinent test results  Airway Mallampati: II  TM Distance: >3 FB Neck ROM: Full    Dental no notable dental hx.    Pulmonary neg pulmonary ROS, former smoker,    Pulmonary exam normal breath sounds clear to auscultation       Cardiovascular hypertension, Pt. on medications + Peripheral Vascular Disease  Normal cardiovascular exam Rhythm:Regular Rate:Normal     Neuro/Psych negative neurological ROS  negative psych ROS   GI/Hepatic negative GI ROS, Neg liver ROS,   Endo/Other  diabetes, Insulin DependentMorbid obesity  Renal/GU negative Renal ROS  negative genitourinary   Musculoskeletal negative musculoskeletal ROS (+)   Abdominal   Peds negative pediatric ROS (+)  Hematology negative hematology ROS (+)   Anesthesia Other Findings   Reproductive/Obstetrics negative OB ROS                            Anesthesia Physical Anesthesia Plan  ASA: III  Anesthesia Plan: MAC   Post-op Pain Management:    Induction: Intravenous  Airway Management Planned: LMA  Additional Equipment:   Intra-op Plan:   Post-operative Plan:   Informed Consent: I have reviewed the patients History and Physical, chart, labs and discussed the procedure including the risks, benefits and alternatives for the proposed anesthesia with the patient or authorized representative who has indicated his/her understanding and acceptance.   Dental advisory given  Plan Discussed with: CRNA and Surgeon  Anesthesia Plan Comments:        Anesthesia Quick Evaluation

## 2015-07-27 NOTE — Transfer of Care (Signed)
Immediate Anesthesia Transfer of Care Note  Patient: [name]  Procedure(s) Performed: Procedure(s): Right 2nd Toe Amputation (Right)  Patient Location: PACU  Anesthesia Type:General  Level of Consciousness: awake and patient cooperative  Airway & Oxygen Therapy: Patient Spontanous Breathing and Patient connected to nasal cannula oxygen  Post-op Assessment: Report given to RN, Post -op Vital signs reviewed and stable and Patient moving all extremities  Post vital signs: Reviewed and stable  Last Vitals:  Filed Vitals:   07/27/15 1155  BP: 149/56  Pulse: 58  Temp: 36.4 C  Resp: 20    Complications: No apparent anesthesia complications

## 2015-07-27 NOTE — Op Note (Signed)
07/27/2015  2:33 PM  PATIENT:  [name]    PRE-OPERATIVE DIAGNOSIS:  Osteomyelitis Right 2nd Toe  POST-OPERATIVE DIAGNOSIS:  Same  PROCEDURE:  Right 2nd Toe Amputation  SURGEON:  [name], MD  PHYSICIAN ASSISTANT:None ANESTHESIA:   General  PREOPERATIVE INDICATIONS:  [name] is a  55 y.o. male with a diagnosis of Osteomyelitis Right 2nd Toe who failed conservative measures and elected for surgical management.    The risks benefits and alternatives were discussed with the patient preoperatively including but not limited to the risks of infection, bleeding, nerve injury, cardiopulmonary complications, the need for revision surgery, among others, and the patient was willing to proceed.  OPERATIVE IMPLANTS: None  OPERATIVE FINDINGS: Good petechial bleeding  OPERATIVE PROCEDURE: Patient brought the operating room and underwent a general anesthetic. After adequate levels anesthesia obtained patient's right lower extremity was prepped using DuraPrep draped into a sterile field. A timeout was called. Elliptical incision was made around the second toe. The toe was amputated through the MTP joint. Wound was irrigated normal saline lactic cautery was used for hemostasis. The wound was closed using 2-0 nylon. A sterile compressive dressing was applied patient was extubated taken to the PACU in stable condition plan for discharge to home.

## 2015-07-27 NOTE — H&P (Signed)
[name] is an 55 y.o. male.   Chief Complaint: Osteomyelitis ulceration second toe right foot HPI: Patient 55 year old gentleman with peripheral vascular disease diabetes who presents with ulceration osteomyelitis right foot second toe  Past Medical History  Diagnosis Date  . Leg swelling   . Visual disturbance   . Blood transfusion 2009  . Hyperlipemia   . Diabetes mellitus     type 2  . Hypertension     denies    Past Surgical History  Procedure Laterality Date  . Foot surgery  11/2007    small toe on left foot removed.  . Cholecystectomy  03/2011  . Nasal septum surgery  1981    Due to broken nose. Surgery was to repair how bone healed.  . Tonsillectomy    . Amputation Left 2012    Left Great Toe  . Pars plana vitrectomy  11/17/2011    Procedure: PARS PLANA VITRECTOMY WITH 23 GAUGE;  Surgeon: Shade Flood, MD;  Location: [facility] OR;  Service: Ophthalmology;  Laterality: Right;  With Endolaser  . Amputation Right 04/20/2015    Procedure: Right Great Toe Amputation;  Surgeon: [name], MD;  Location: [facility] OR;  Service: Orthopedics;  Laterality: Right;  . Eye surgery      lazer    Family History  Problem Relation Age of Onset  . Cancer Mother     breast   Social History:  reports that he quit smoking about 36 years ago. His smoking use included Cigarettes. He has never used smokeless tobacco. He reports that he drinks alcohol. He reports that he does not use illicit drugs.  Allergies: No Known Allergies  No prescriptions prior to admission    No results found for this or any previous visit (from the past 48 hour(s)). No results found.  Review of Systems  Eyes: Blurred vision:  Risk and benefits were discussed patient states he understands wishes to proceed at this time. Assessment:  All other systems reviewed and are negative.   There were no vitals taken for this visit. Physical Exam on examination patient has ulceration of the left foot second toe the bone is  exposed radiograph shows destructive bony changes. Assessment/Plan Assessment: Ostium myelitis ulceration right foot second toe.  Plan: We will plan for right foot second toe amputation. Risk and benefits were discussed patient states he understands wishes to proceed at this time.  [name], MD 07/27/2015, 7:07 AM

## 2015-07-27 NOTE — Anesthesia Procedure Notes (Signed)
Procedure Name: LMA Insertion Date/Time: 07/27/2015 2:20 PM Performed by: Charm Barges, Yasheka Fossett R Pre-anesthesia Checklist: Patient identified, Emergency Drugs available, Suction available, Patient being monitored and Timeout performed Patient Re-evaluated:Patient Re-evaluated prior to inductionOxygen Delivery Method: Circle system utilized Preoxygenation: Pre-oxygenation with 100% oxygen Intubation Type: IV induction Ventilation: Mask ventilation without difficulty LMA: LMA inserted LMA Size: 5.0 Number of attempts: 1 Placement Confirmation: positive ETCO2 Tube secured with: Tape Dental Injury: Teeth and Oropharynx as per pre-operative assessment

## 2015-07-28 DIAGNOSIS — E1169 Type 2 diabetes mellitus with other specified complication: Secondary | ICD-10-CM | POA: Diagnosis not present

## 2015-07-28 LAB — GLUCOSE, CAPILLARY: Glucose-Capillary: 108 mg/dL — ABNORMAL HIGH (ref 65–99)

## 2015-07-28 NOTE — Progress Notes (Signed)
PT Cancellation Note  Patient Details Name: [name] MRN: [id number] DOB: [birth year]   Cancelled Treatment:    Reason Eval/Treat Not Completed: Other (comment) (Attempted to see pt but he has already d/c)  Michail Jewels PT, DPT [phone] Pager: [phone] 07/28/2015, 9:18 AM

## 2015-07-28 NOTE — Progress Notes (Signed)
Very eager to go home Denies pain Dressing c/d/i No events, NAD, VSSAF Patient has pain meds at home Dc home today  N. Glee Arvin, MD [facility] [phone] 8:32 AM

## 2015-07-30 ENCOUNTER — Encounter (HOSPITAL_COMMUNITY): Payer: Self-pay | Admitting: Orthopedic Surgery

## 2015-08-08 NOTE — Anesthesia Postprocedure Evaluation (Signed)
Anesthesia Post Note  Patient: [name]  Procedure(s) Performed: Procedure(s) (LRB): Right 2nd Toe Amputation (Right)  Patient location during evaluation: PACU Anesthesia Type: General Level of consciousness: awake and alert Pain management: pain level controlled Vital Signs Assessment: post-procedure vital signs reviewed and stable Respiratory status: spontaneous breathing, nonlabored ventilation, respiratory function stable and patient connected to nasal cannula oxygen Cardiovascular status: blood pressure returned to baseline and stable Postop Assessment: no signs of nausea or vomiting Anesthetic complications: no    Last Vitals:  Filed Vitals:   07/27/15 2319 07/28/15 0508  BP: 104/40 124/62  Pulse: 65 66  Temp: 37.1 C 37 C  Resp: 16     Last Pain:  Filed Vitals:   07/28/15 1154  PainSc: 0-No pain                 Trista Ciocca S

## 2016-03-20 ENCOUNTER — Ambulatory Visit (INDEPENDENT_AMBULATORY_CARE_PROVIDER_SITE_OTHER): Payer: Medicaid Other | Admitting: Orthopedic Surgery

## 2016-03-20 DIAGNOSIS — M86271 Subacute osteomyelitis, right ankle and foot: Secondary | ICD-10-CM

## 2016-03-20 DIAGNOSIS — E1142 Type 2 diabetes mellitus with diabetic polyneuropathy: Secondary | ICD-10-CM | POA: Diagnosis not present

## 2016-03-20 DIAGNOSIS — L97421 Non-pressure chronic ulcer of left heel and midfoot limited to breakdown of skin: Secondary | ICD-10-CM | POA: Diagnosis not present

## 2016-03-20 DIAGNOSIS — Z89421 Acquired absence of other right toe(s): Secondary | ICD-10-CM | POA: Diagnosis not present

## 2016-03-20 DIAGNOSIS — L97511 Non-pressure chronic ulcer of other part of right foot limited to breakdown of skin: Secondary | ICD-10-CM | POA: Diagnosis not present

## 2016-04-17 ENCOUNTER — Ambulatory Visit (INDEPENDENT_AMBULATORY_CARE_PROVIDER_SITE_OTHER): Payer: Medicaid Other | Admitting: Orthopedic Surgery

## 2016-04-17 ENCOUNTER — Encounter (INDEPENDENT_AMBULATORY_CARE_PROVIDER_SITE_OTHER): Payer: Self-pay | Admitting: Orthopedic Surgery

## 2016-04-17 VITALS — Ht 69.0 in | Wt 260.0 lb

## 2016-04-17 DIAGNOSIS — L97421 Non-pressure chronic ulcer of left heel and midfoot limited to breakdown of skin: Secondary | ICD-10-CM | POA: Diagnosis not present

## 2016-04-17 DIAGNOSIS — E1142 Type 2 diabetes mellitus with diabetic polyneuropathy: Secondary | ICD-10-CM | POA: Diagnosis not present

## 2016-04-17 NOTE — Progress Notes (Signed)
Wound Care Note   Patient: [name]           Date of Birth: September 16, 1960           MRN: [id number]             PCP: Dorrene GermanEdwin A Avbuere, MD Visit Date: 04/17/2016   Assessment & Plan: Visit Diagnoses:  1. Ulcer of left heel, limited to breakdown of skin (HCC)   2. Diabetic polyneuropathy associated with type 2 diabetes mellitus (HCC)     Plan: Plan to follow-up in 4 weeks. Patient to continue Iodosorb dressing changes continue pressure unloading with modified shoes.   Follow-Up Instructions: Return in about 4 weeks (around 05/15/2016).  Orders:  No orders of the defined types were placed in this encounter.  No orders of the defined types were placed in this encounter.     Procedures: No notes on file   Clinical Data: No additional findings.   No images are attached to the encounter.   Subjective: Chief Complaint  Patient presents with  . Left Foot - Open Wound    Left foot ulcer plantar and heel    Bilateral foot check right foot well healed and no open areas. Left foot plantar aspect of heel small open area, spot amount bloody drain, maceration around wound border. Pt in post op shoe and weight bearing with walker. Patient is wearing Vive sock BLE    Review of Systems  Miscellaneous:  -Home Health Care: N/A  -Physical Therapy: N/A  -Out of Work?: N/A  -Worker's Compensation Case?: N/A  -Additional Information: N/A   Objective: Vital Signs: Ht 5\' 9"  (1.753 m)   Wt 260 lb (117.9 kg)   BMI 38.40 kg/m   Physical Exam: Patient is alert oriented no adenopathy well-dressed normal affect normal respiratory effort.  Patient has an antalgic gait. Examination of both feet he has venous stasis swelling currently wearing compression stockings. He has callus on the right heel which was  The 10 blade knife after informed consent. There is no open ulcers on the right heel. Left heel he has a chronic Wagner grade 1 ulcer. After informed consent a 10 blade knife was  used to debride the skin and soft tissue back to bleeding viable granulation tissue. Silver nitrate was used for hemostasis. The ulcers 2 cm in diameter and 5 mm deep with 100% beefy granulation tissue after debridement. A sterile dressing was applied.  Specialty Comments: No specialty comments available.   PMFS History: Patient Active Problem List   Diagnosis Date Noted  . Amputated toe of right foot (HCC) 07/27/2015  . Great toe pain 06/17/2014  . Type II diabetes mellitus with peripheral circulatory disorder (HCC) 06/17/2014  . Elevated transaminase level 06/17/2014  . HTN (hypertension) 06/17/2014  . Visual disturbance    Past Medical History:  Diagnosis Date  . Blood transfusion 2009  . Diabetes mellitus    type 2  . Hyperlipemia   . Hypertension    denies  . Leg swelling   . Visual disturbance     Family History  Problem Relation Age of Onset  . Cancer Mother     breast   Past Surgical History:  Procedure Laterality Date  . AMPUTATION Left 2012   Left Great Toe  . AMPUTATION Right 04/20/2015   Procedure: Right Great Toe Amputation;  Surgeon: Nadara MustardMarcus Bocephus Cali V, MD;  [facility];  Service: Orthopedics;  Laterality: Right;  . AMPUTATION Right 07/27/2015   Procedure: Right  2nd Toe Amputation;  Surgeon: Nadara MustardMarcus Ezelle Surprenant V, MD;  [facility];  Service: Orthopedics;  Laterality: Right;  . CHOLECYSTECTOMY  03/2011  . EYE SURGERY     lazer  . foot surgery  11/2007   small toe on left foot removed.  Marland Kitchen. NASAL SEPTUM SURGERY  1981   Due to broken nose. Surgery was to repair how bone healed.  Marland Kitchen. PARS PLANA VITRECTOMY  11/17/2011   Procedure: PARS PLANA VITRECTOMY WITH 23 GAUGE;  Surgeon: Shade FloodGreer Geiger, MD;  [facility];  Service: Ophthalmology;  Laterality: Right;  With Endolaser  . TONSILLECTOMY     Social History   Occupational History  . Not on file.   Social History Main Topics  . Smoking status: Former Smoker    Types: Cigarettes    Quit date: 03/25/1979  . Smokeless  tobacco: Never Used  . Alcohol use Yes     Comment: occasional  6 pk of malt every  . Drug use: No  . Sexual activity: Not on file

## 2016-05-15 ENCOUNTER — Ambulatory Visit (INDEPENDENT_AMBULATORY_CARE_PROVIDER_SITE_OTHER): Payer: Medicaid Other | Admitting: Orthopedic Surgery

## 2016-05-15 ENCOUNTER — Encounter (INDEPENDENT_AMBULATORY_CARE_PROVIDER_SITE_OTHER): Payer: Self-pay | Admitting: Family

## 2016-05-15 VITALS — Ht 69.0 in | Wt 260.0 lb

## 2016-05-15 DIAGNOSIS — L97421 Non-pressure chronic ulcer of left heel and midfoot limited to breakdown of skin: Secondary | ICD-10-CM | POA: Diagnosis not present

## 2016-05-15 NOTE — Progress Notes (Signed)
Wound Care Note   Patient: [name]           Date of Birth: [birth year]           MRN: [id number]             PCP: Dorrene GermanEdwin A Avbuere, MD Visit Date: 05/15/2016   Assessment & Plan: Visit Diagnoses:  1. Ulcer of left heel, limited to breakdown of skin (HCC)     Plan: Ulcer debridement of skin and soft tissue good healthy granulation tissue this did not probe to bone discussed the importance of nonweightbearing on the left lower extremity discussed the risk if this ulcer goes down to bone that he would require a below the knee amputation. He does have samples of Iodosorb and he'll continue using his dressing changes. Patient requests follow-up in 4 weeks.   Follow-Up Instructions: Return in about 4 weeks (around 06/12/2016).  Orders:  No orders of the defined types were placed in this encounter.  No orders of the defined types were placed in this encounter.     Procedures: No notes on file   Clinical Data: No additional findings.   No images are attached to the encounter.   Subjective: Chief Complaint  Patient presents with  . Left Foot - Wound Check  . Right Foot - Wound Check    Patient states that the right foot is well healed and did not want to remove his shoe and sock for this visit. The left foot has an ulcer plantar aspect of the foot and the patient states that he is disappointed that it is not healing faster. He uses iodosorb dressing changes daily and a compression sock, off loads with a felt donut and is in a post op shoe. He is not currently taking any abx. He does have a small amount of pink clear drain and the periwound area is macerated. There is no odor and no complaints of fever.    Wound Check     Review of Systems   Objective: Vital Signs: Ht 5\' 9"  (1.753 m)   Wt 260 lb (117.9 kg)   BMI 38.40 kg/m   Physical Exam: On examination patient is alert oriented no adenopathy well-dressed normal affect normal respiratory effort he does have an  antalgic gait he is wearing venous insufficiency compression socks he has brawny skin color changes but no open ulcers in his legs. Patient has a plantar grade foot with a recurrent ulcer on the left heel after informed consent a 10 blade knife was used to debride the skin and soft tissue back to bleeding viable granulation tissue. Silver nitrate was used for hemostasis. The ulcer measures 2 x 3 cm and is 5 mm deep. This did not probe to bone or tendon.  Specialty Comments: No specialty comments available.   PMFS History: Patient Active Problem List   Diagnosis Date Noted  . Ulcer of left heel, limited to breakdown of skin (HCC) 05/15/2016  . Amputated toe of right foot (HCC) 07/27/2015  . Great toe pain 06/17/2014  . Type II diabetes mellitus with peripheral circulatory disorder (HCC) 06/17/2014  . Elevated transaminase level 06/17/2014  . HTN (hypertension) 06/17/2014  . Visual disturbance    Past Medical History:  Diagnosis Date  . Blood transfusion 2009  . Diabetes mellitus    type 2  . Hyperlipemia   . Hypertension    denies  . Leg swelling   . Visual disturbance     Family History  Problem Relation Age of Onset  . Cancer Mother     breast   Past Surgical History:  Procedure Laterality Date  . AMPUTATION Left 2012   Left Great Toe  . AMPUTATION Right 04/20/2015   Procedure: Right Great Toe Amputation;  Surgeon: Nadara MustardMarcus Duda V, MD;  [facility];  Service: Orthopedics;  Laterality: Right;  . AMPUTATION Right 07/27/2015   Procedure: Right 2nd Toe Amputation;  Surgeon: Nadara MustardMarcus Duda V, MD;  [facility];  Service: Orthopedics;  Laterality: Right;  . CHOLECYSTECTOMY  03/2011  . EYE SURGERY     lazer  . foot surgery  11/2007   small toe on left foot removed.  Marland Kitchen. NASAL SEPTUM SURGERY  1981   Due to broken nose. Surgery was to repair how bone healed.  Marland Kitchen. PARS PLANA VITRECTOMY  11/17/2011   Procedure: PARS PLANA VITRECTOMY WITH 23 GAUGE;  Surgeon: Shade FloodGreer Geiger, MD;  [facility];  Service: Ophthalmology;  Laterality: Right;  With Endolaser  . TONSILLECTOMY     Social History   Occupational History  . Not on file.   Social History Main Topics  . Smoking status: Former Smoker    Types: Cigarettes    Quit date: 03/25/1979  . Smokeless tobacco: Never Used  . Alcohol use Yes     Comment: occasional  6 pk of malt every  . Drug use: No  . Sexual activity: Not on file

## 2016-06-12 ENCOUNTER — Encounter (INDEPENDENT_AMBULATORY_CARE_PROVIDER_SITE_OTHER): Payer: Self-pay

## 2016-06-12 ENCOUNTER — Ambulatory Visit (INDEPENDENT_AMBULATORY_CARE_PROVIDER_SITE_OTHER): Payer: Medicaid Other | Admitting: Family

## 2016-06-12 DIAGNOSIS — L97421 Non-pressure chronic ulcer of left heel and midfoot limited to breakdown of skin: Secondary | ICD-10-CM

## 2016-06-12 DIAGNOSIS — E1151 Type 2 diabetes mellitus with diabetic peripheral angiopathy without gangrene: Secondary | ICD-10-CM | POA: Diagnosis not present

## 2016-06-12 DIAGNOSIS — M25572 Pain in left ankle and joints of left foot: Secondary | ICD-10-CM | POA: Diagnosis not present

## 2016-06-12 NOTE — Progress Notes (Signed)
Office Visit Note   Patient: [name]           Date of Birth: 16-Jun-1961           MRN: [id number] Visit Date: 06/12/2016              Requested by: Fleet ContrasEdwin Avbuere, MD [address] [place], [place] [id number] PCP: Dorrene GermanEdwin A Avbuere, MD   Assessment & Plan: Visit Diagnoses:  1. Ulcer of left heel, limited to breakdown of skin (HCC)   2. Type II diabetes mellitus with peripheral circulatory disorder (HCC)   3. Pain in left ankle and joints of left foot     Plan: Encouraged him to offload pressure around the wound. Patient prefers to continue with his own padding in his shoe. Continue dressing wound with Iodosorb. Follow-up in the office in 4 weeks.  Follow-Up Instructions: No Follow-up on file.   Orders:  No orders of the defined types were placed in this encounter.  No orders of the defined types were placed in this encounter.     Procedures: No procedures performed   Clinical Data: No additional findings.   Subjective: Chief Complaint  Patient presents with  . Left Foot - Wound Check    Patient is a 55 year old gentleman who presents today for evaluation of a left foot heel ulcer, plantar aspect. This is stable. Does complain of pain around the wound. Has been applying iodosorb daily. Tries to minimize weightbearing. Is full weight bearing in a post op shoe with padding he has applied.   Is not complaint with nonweight bearing nor pressure relieving shoe wear as recommended. Cannot afford a kneeling scooter.   Complains of left ankle eversion with weight bearing. [employer] has recommended a double upright brace to assist with ambulation.    Wound Check     Review of Systems  Constitutional: Negative for chills and fever.  Skin: Positive for wound.     Objective: Vital Signs: There were no vitals taken for this visit.  Physical Exam  Constitutional: He is oriented to person, place, and time. He appears well-developed and well-nourished.    Pulmonary/Chest: Effort normal.  Musculoskeletal:  Left foot heel wound is 12 mm in diameter. Is 2 mm deep. Beefy granulation tissue in wound bed. There is maceration of surrounding tissue which was debrided with a number 10 blade knife back to viable tissue. No bleeding. No drainage, surrounding erythema or sign of infection.   Neurological: He is alert and oriented to person, place, and time.  Psychiatric: He has a normal mood and affect.  Nursing note reviewed.   Left Ankle Exam   Tenderness  The patient is experiencing no tenderness.   Range of Motion  The patient has normal left ankle ROM.   Tests  Anterior drawer: negative      Specialty Comments:  No specialty comments available.  Imaging: No results found.   PMFS History: Patient Active Problem List   Diagnosis Date Noted  . Ulcer of left heel, limited to breakdown of skin (HCC) 05/15/2016  . Amputated toe of right foot (HCC) 07/27/2015  . Great toe pain 06/17/2014  . Type II diabetes mellitus with peripheral circulatory disorder (HCC) 06/17/2014  . Elevated transaminase level 06/17/2014  . HTN (hypertension) 06/17/2014  . Visual disturbance    Past Medical History:  Diagnosis Date  . Blood transfusion 2009  . Diabetes mellitus    type 2  . Hyperlipemia   . Hypertension  denies  . Leg swelling   . Visual disturbance     Family History  Problem Relation Age of Onset  . Cancer Mother     breast    Past Surgical History:  Procedure Laterality Date  . AMPUTATION Left 2012   Left Great Toe  . AMPUTATION Right 04/20/2015   Procedure: Right Great Toe Amputation;  Surgeon: Nadara MustardMarcus Duda V, MD;  [facility];  Service: Orthopedics;  Laterality: Right;  . AMPUTATION Right 07/27/2015   Procedure: Right 2nd Toe Amputation;  Surgeon: Nadara MustardMarcus Duda V, MD;  [facility];  Service: Orthopedics;  Laterality: Right;  . CHOLECYSTECTOMY  03/2011  . EYE SURGERY     lazer  . foot surgery  11/2007   small toe  on left foot removed.  Marland Kitchen. NASAL SEPTUM SURGERY  1981   Due to broken nose. Surgery was to repair how bone healed.  Marland Kitchen. PARS PLANA VITRECTOMY  11/17/2011   Procedure: PARS PLANA VITRECTOMY WITH 23 GAUGE;  Surgeon: Shade FloodGreer Geiger, MD;  [facility];  Service: Ophthalmology;  Laterality: Right;  With Endolaser  . TONSILLECTOMY     Social History   Occupational History  . Not on file.   Social History Main Topics  . Smoking status: Former Smoker    Types: Cigarettes    Quit date: 03/25/1979  . Smokeless tobacco: Never Used  . Alcohol use Yes     Comment: occasional  6 pk of malt every  . Drug use: No  . Sexual activity: Not on file

## 2016-07-17 ENCOUNTER — Ambulatory Visit (INDEPENDENT_AMBULATORY_CARE_PROVIDER_SITE_OTHER): Payer: Medicaid Other | Admitting: Orthopedic Surgery

## 2016-07-17 ENCOUNTER — Encounter (INDEPENDENT_AMBULATORY_CARE_PROVIDER_SITE_OTHER): Payer: Self-pay | Admitting: Orthopedic Surgery

## 2016-07-17 VITALS — Ht 69.0 in | Wt 260.0 lb

## 2016-07-17 DIAGNOSIS — Z89421 Acquired absence of other right toe(s): Secondary | ICD-10-CM

## 2016-07-17 DIAGNOSIS — L97421 Non-pressure chronic ulcer of left heel and midfoot limited to breakdown of skin: Secondary | ICD-10-CM

## 2016-07-17 DIAGNOSIS — S98131A Complete traumatic amputation of one right lesser toe, initial encounter: Secondary | ICD-10-CM

## 2016-07-17 NOTE — Progress Notes (Signed)
Office Visit Note   Patient: [name]           Date of Birth: [birth year]           MRN: [id number] Visit Date: 07/17/2016              Requested by: Fleet ContrasEdwin Avbuere, MD [address] [place], [place] [id number] PCP: Dorrene GermanEdwin A Avbuere, MD  Chief Complaint  Patient presents with  . Left Foot - Wound Check    HPI: Patient presents today for left foot follow up for left plantar heel ulceration. He is applying iodosorb dressing. There is maceration. He is full weightbearing with a walker and post operative shoe with felt relieving pads. Donalee CitrinStepheney L Peele, RT    Assessment & Plan: Visit Diagnoses:  1. Ulcer of left heel, limited to breakdown of skin (HCC)   2. Amputated toe of right foot (HCC)     Plan: Patient is given a prescription for a wheelchair. The Medicaid would not cover a kneeling scooter. He will [facility] to [facility] home [facility] for a wheelchair. He will continue the Iodosorb dressing changes he was given a tube of Iodosorb. He has a prescription for extra-depth shoes braces and custom orthotics to protect his foot but he states he is still under review for authorization.  Follow-Up Instructions: Return in about 4 weeks (around 08/14/2016).   Ortho Exam Examination patient is alert oriented no adenopathy well-dressed normal affect normal respiratory effort is ambulating with a walker. Examination the left foot he does have swelling around the left foot but no cellulitis no odor no drainage no signs of infection. He has a ulcer on the plantar aspect of the left heel. After general debridement with a 4 x 4 gauze there was good bleeding wound edges had good bleeding no nonviable callus around the wound edges. Silver nitrate was used for hemostasis Iodosorb plus 4 x 4 gauze and tape was applied. Patient was placed back in his medical compression sock.  Imaging: No results found.  Orders:  No orders of the defined types were placed in this encounter.  No orders of the defined types  were placed in this encounter.    Procedures: No procedures performed  Clinical Data: No additional findings.  Subjective: Review of Systems  Objective: Vital Signs: Ht 5\' 9"  (1.753 m)   Wt 260 lb (117.9 kg)   BMI 38.40 kg/m   Specialty Comments:  No specialty comments available.  PMFS History: Patient Active Problem List   Diagnosis Date Noted  . Ulcer of left heel, limited to breakdown of skin (HCC) 05/15/2016  . Amputated toe of right foot (HCC) 07/27/2015  . Great toe pain 06/17/2014  . Type II diabetes mellitus with peripheral circulatory disorder (HCC) 06/17/2014  . Elevated transaminase level 06/17/2014  . HTN (hypertension) 06/17/2014  . Visual disturbance    Past Medical History:  Diagnosis Date  . Blood transfusion 2009  . Diabetes mellitus    type 2  . Hyperlipemia   . Hypertension    denies  . Leg swelling   . Visual disturbance     Family History  Problem Relation Age of Onset  . Cancer Mother     breast    Past Surgical History:  Procedure Laterality Date  . AMPUTATION Left 2012   Left Great Toe  . AMPUTATION Right 04/20/2015   Procedure: Right Great Toe Amputation;  Surgeon: Nadara MustardMarcus Archibald Marchetta V, MD;  [facility];  Service: Orthopedics;  Laterality: Right;  .  AMPUTATION Right 07/27/2015   Procedure: Right 2nd Toe Amputation;  Surgeon: [name], MD;  [facility];  Service: Orthopedics;  Laterality: Right;  . CHOLECYSTECTOMY  03/2011  . EYE SURGERY     lazer  . foot surgery  11/2007   small toe on left foot removed.  Marland Kitchen NASAL SEPTUM SURGERY  1981   Due to broken nose. Surgery was to repair how bone healed.  Marland Kitchen PARS PLANA VITRECTOMY  11/17/2011   Procedure: PARS PLANA VITRECTOMY WITH 23 GAUGE;  Surgeon: Shade Flood, MD;  [facility];  Service: Ophthalmology;  Laterality: Right;  With Endolaser  . TONSILLECTOMY     Social History   Occupational History  . Not on file.   Social History Main Topics  . Smoking status: Former Smoker      Types: Cigarettes    Quit date: 03/25/1979  . Smokeless tobacco: Never Used  . Alcohol use Yes     Comment: occasional  6 pk of malt every  . Drug use: No  . Sexual activity: Not on file

## 2016-08-14 ENCOUNTER — Ambulatory Visit (INDEPENDENT_AMBULATORY_CARE_PROVIDER_SITE_OTHER): Payer: Medicaid Other | Admitting: Orthopedic Surgery

## 2016-08-14 DIAGNOSIS — I87332 Chronic venous hypertension (idiopathic) with ulcer and inflammation of left lower extremity: Secondary | ICD-10-CM

## 2016-08-14 DIAGNOSIS — E1142 Type 2 diabetes mellitus with diabetic polyneuropathy: Secondary | ICD-10-CM

## 2016-08-14 DIAGNOSIS — I87323 Chronic venous hypertension (idiopathic) with inflammation of bilateral lower extremity: Secondary | ICD-10-CM | POA: Insufficient documentation

## 2016-08-14 DIAGNOSIS — L97421 Non-pressure chronic ulcer of left heel and midfoot limited to breakdown of skin: Secondary | ICD-10-CM | POA: Diagnosis not present

## 2016-08-14 DIAGNOSIS — L97929 Non-pressure chronic ulcer of unspecified part of left lower leg with unspecified severity: Secondary | ICD-10-CM

## 2016-08-14 NOTE — Progress Notes (Signed)
Office Visit Note   Patient: [name]           Date of Birth: 11/11/1960           MRN: [id number] Visit Date: 08/14/2016              Requested by: Fleet Contras, MD [address] [place], [place] [address] PCP: Dorrene German, MD  No chief complaint on file.   HPI: Patient is here for bilateral foot eval. He ambulates with a walker. There is a right heel ulcer with maceration. Pink base and spot amount of bloody drain The pt is covering with dry dressing and using a post op shoe. The left 5th toe has a small ulcer and the pt is using a vive compression sock on this side. He has cut a hole so the ulcer does not rub inside. There is no swelling and no drainage for the area. Rodena Medin, RMA    Assessment & Plan: Visit Diagnoses:  1. Ulcer of left heel, limited to breakdown of skin (HCC)   2. Diabetic polyneuropathy associated with type 2 diabetes mellitus (HCC)   3. Chronic venous hypertension (idiopathic) with ulcer and inflammation of left lower extremity (HCC)     Plan: Recommend patient to wear his 2 layer medical compression sock for the left lower extremity he has massive swelling at this time is developed multiple venous ulcers. Discussed that if he is unable to wear the 2 layers of compression June the day that he should follow up prior to his scheduled follow-up and we will place him in a compression wrap 3 layer for the left lower extremity.  Follow-Up Instructions: Return in about 4 weeks (around 09/11/2016).   Ortho Exam Examination patient is alert oriented no adenopathy well-dressed normal affect normal respiratory effort he has an antalgic gait. Examination patient has massive venous stasis swelling left lower extremity with multiple venous stasis blisters. Patient states he has not been wearing his compression sock he was concerned the compression sock causes blisters and he has massive swelling of the left leg. The right leg he is been wearing his  compression stocking has no swelling no ulcerations he does have brawny skin color changes. Patient has an ulcer over the fifth toe right foot. After informed consent a 10 blade knife was used to debride the skin and soft tissue patient did have a small drop of purulence beneath the callused skin. This was debrided decompressed and the ulcer did not probe to bone or tendon. The ulcer is 5 mm in diameter and 3 mm deep. Examination the left heel he still has a persistent ulcer there is 100% beefy granulation tissue there is some maceration around the wound edges. The wound was debrided of skin and soft tissue with a 10 blade knife after informed consent the ulcers 2 cm in diameter and 3 mm deep. Patient tolerated this well.  Imaging: No results found.  Orders:  No orders of the defined types were placed in this encounter.  No orders of the defined types were placed in this encounter.    Procedures: No procedures performed  Clinical Data: No additional findings.  Subjective: Review of Systems  Objective: Vital Signs: There were no vitals taken for this visit.  Specialty Comments:  No specialty comments available.  PMFS History: Patient Active Problem List   Diagnosis Date Noted  . Diabetic polyneuropathy associated with type 2 diabetes mellitus (HCC) 08/14/2016  . Chronic venous hypertension (idiopathic) with  ulcer and inflammation of left lower extremity (HCC) 08/14/2016  . Ulcer of left heel, limited to breakdown of skin (HCC) 05/15/2016  . Amputated toe of right foot (HCC) 07/27/2015  . Great toe pain 06/17/2014  . Type II diabetes mellitus with peripheral circulatory disorder (HCC) 06/17/2014  . Elevated transaminase level 06/17/2014  . HTN (hypertension) 06/17/2014  . Visual disturbance    Past Medical History:  Diagnosis Date  . Blood transfusion 2009  . Diabetes mellitus    type 2  . Hyperlipemia   . Hypertension    denies  . Leg swelling   . Visual disturbance      Family History  Problem Relation Age of Onset  . Cancer Mother     breast    Past Surgical History:  Procedure Laterality Date  . AMPUTATION Left 2012   Left Great Toe  . AMPUTATION Right 04/20/2015   Procedure: Right Great Toe Amputation;  Surgeon: Nadara MustardMarcus Amery Minasyan V, MD;  [facility];  Service: Orthopedics;  Laterality: Right;  . AMPUTATION Right 07/27/2015   Procedure: Right 2nd Toe Amputation;  Surgeon: Nadara MustardMarcus Ivet Guerrieri V, MD;  [facility];  Service: Orthopedics;  Laterality: Right;  . CHOLECYSTECTOMY  03/2011  . EYE SURGERY     lazer  . foot surgery  11/2007   small toe on left foot removed.  Marland Kitchen. NASAL SEPTUM SURGERY  1981   Due to broken nose. Surgery was to repair how bone healed.  Marland Kitchen. PARS PLANA VITRECTOMY  11/17/2011   Procedure: PARS PLANA VITRECTOMY WITH 23 GAUGE;  Surgeon: Shade FloodGreer Geiger, MD;  [facility];  Service: Ophthalmology;  Laterality: Right;  With Endolaser  . TONSILLECTOMY     Social History   Occupational History  . Not on file.   Social History Main Topics  . Smoking status: Former Smoker    Types: Cigarettes    Quit date: 03/25/1979  . Smokeless tobacco: Never Used  . Alcohol use Yes     Comment: occasional  6 pk of malt every  . Drug use: No  . Sexual activity: Not on file

## 2016-08-29 ENCOUNTER — Ambulatory Visit (INDEPENDENT_AMBULATORY_CARE_PROVIDER_SITE_OTHER): Payer: Medicaid Other | Admitting: Family

## 2016-09-18 ENCOUNTER — Ambulatory Visit (INDEPENDENT_AMBULATORY_CARE_PROVIDER_SITE_OTHER): Payer: Medicaid Other | Admitting: Family

## 2016-09-18 ENCOUNTER — Encounter (INDEPENDENT_AMBULATORY_CARE_PROVIDER_SITE_OTHER): Payer: Self-pay | Admitting: Orthopedic Surgery

## 2016-09-18 DIAGNOSIS — L97421 Non-pressure chronic ulcer of left heel and midfoot limited to breakdown of skin: Secondary | ICD-10-CM

## 2016-09-18 DIAGNOSIS — I87332 Chronic venous hypertension (idiopathic) with ulcer and inflammation of left lower extremity: Secondary | ICD-10-CM

## 2016-09-18 DIAGNOSIS — M869 Osteomyelitis, unspecified: Secondary | ICD-10-CM

## 2016-09-18 DIAGNOSIS — L97929 Non-pressure chronic ulcer of unspecified part of left lower leg with unspecified severity: Secondary | ICD-10-CM

## 2016-09-18 MED ORDER — DOXYCYCLINE HYCLATE 100 MG PO TABS: 100.0000 mg | ORAL_TABLET | Freq: Two times a day (BID) | ORAL | 0 refills | Status: DC

## 2016-09-18 NOTE — Progress Notes (Signed)
Office Visit Note   Patient: [name]           Date of Birth: 1960-09-29           MRN: [id number] Visit Date: 09/18/2016              Requested by: Fleet Contras, MD [address] [place], [place] [address] PCP: Dorrene German, MD  Chief Complaint  Patient presents with  . Left Foot - Follow-up  . Right Foot - Follow-up    HPI: Patient is a 56 year old gentleman who presents today for bilateral foot eval. He ambulates with a walker. states is using a rolling kneeling walker at home for the left foot. There is a left heel ulcer with maceration. The patient is wearing 2 layer medical compression sock to the left lower extremity. Feels the second layer sleeve is making his foot have worsening of swelling. Has been applying silvadene to the ulcer. To the left foot has an ulcer to dorsal 5th toe.     Assessment & Plan: Visit Diagnoses:  1. Ulcer of left heel, limited to breakdown of skin (HCC)   2. 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 Chronic venous hypertension (idiopathic) with ulcer and inflammation of left lower extremity (HCC)     Plan: Per Dr. Audrie Lia previous recommendations will apply 3 layer compression wrap to left lower extremity with vaseline gauze over ulcer. Continue to minimize weight bearing on left with kneeling scooter. Will set him up for amputation of the right 5th toe. Dr. Lajoyce Corners discussed surgical options including amputation of the 3rd, 4th and 5th toes. Patient chose to proceed with 5th toe amputation.   Follow-Up Instructions: Return for 1 w post op.   Ortho Exam Examination patient is alert oriented no adenopathy well-dressed normal affect normal respiratory effort he has an antalgic gait. Examination patient has massive venous stasis swelling left lower extremity with multiple venous stasis ulcerations to anterior shin. Weeping from dorsal left foot and lower extremity.  The right leg he is been wearing his compression stocking has no swelling  no ulcerations he does have brawny skin color changes. Patient has an ulcer over the fifth toe right foot. the ulcer did probe to bone The ulcer is 5 mm in diameter and 5 mm deep. Examination the left heel he still has a persistent ulcer there is 100% beefy granulation tissue there is some maceration around the wound edges. The wound was debrided of skin and soft tissue with a 10 blade knife after informed consent the ulcers 2 cm in diameter and 4 mm deep. Patient tolerated this well.  Imaging: No results found.  Orders:  No orders of the defined types were placed in this encounter.  Meds ordered this encounter  Medications  . doxycycline (VIBRA-TABS) 100 MG tablet    Sig: Take 1 tablet (100 mg total) by mouth 2 (two) times daily.    Dispense:  20 tablet    Refill:  0     Procedures: No procedures performed  Clinical Data: No additional findings.  Subjective: Review of Systems  Constitutional: Negative for chills and fever.  Cardiovascular: Positive for leg swelling.  Skin: Positive for wound. Negative for color change.    Objective: Vital Signs: There were no vitals taken for this visit.  Specialty Comments:  No specialty comments available.  PMFS History: Patient Active Problem List   Diagnosis Date Noted  . Diabetic polyneuropathy associated with type 2 diabetes mellitus (HCC)  08/14/2016  . Chronic venous hypertension (idiopathic) with ulcer and inflammation of left lower extremity (HCC) 08/14/2016  . Ulcer of left heel, limited to breakdown of skin (HCC) 05/15/2016  . Amputated toe of right foot (HCC) 07/27/2015  . Great toe pain 06/17/2014  . Type II diabetes mellitus with peripheral circulatory disorder (HCC) 06/17/2014  . Elevated transaminase level 06/17/2014  . HTN (hypertension) 06/17/2014  . Visual disturbance    Past Medical History:  Diagnosis Date  . Blood transfusion 2009  . Diabetes mellitus    type 2  . Hyperlipemia   . Hypertension    denies    . Leg swelling   . Visual disturbance     Family History  Problem Relation Age of Onset  . Cancer Mother     breast    Past Surgical History:  Procedure Laterality Date  . AMPUTATION Left 2012   Left Great Toe  . AMPUTATION Right 04/20/2015   Procedure: Right Great Toe Amputation;  Surgeon: [name], MD;  Location: [facility] OR;  Service: Orthopedics;  Laterality: Right;  . AMPUTATION Right 07/27/2015   Procedure: Right 2nd Toe Amputation;  Surgeon: [name], MD;  Location: [facility] OR;  Service: Orthopedics;  Laterality: Right;  . CHOLECYSTECTOMY  03/2011  . EYE SURGERY     lazer  . foot surgery  11/2007   small toe on left foot removed.  Marland Kitchen NASAL SEPTUM SURGERY  1981   Due to broken nose. Surgery was to repair how bone healed.  Marland Kitchen PARS PLANA VITRECTOMY  11/17/2011   Procedure: PARS PLANA VITRECTOMY WITH 23 GAUGE;  Surgeon: Shade Flood, MD;  Location: [facility] OR;  Service: Ophthalmology;  Laterality: Right;  With Endolaser  . TONSILLECTOMY     Social History   Occupational History  . Not on file.   Social History Main Topics  . Smoking status: Former Smoker    Types: Cigarettes    Quit date: 03/25/1979  . Smokeless tobacco: Never Used  . Alcohol use Yes     Comment: occasional  6 pk of malt every  . Drug use: No  . Sexual activity: Not on file

## 2016-09-19 ENCOUNTER — Other Ambulatory Visit (INDEPENDENT_AMBULATORY_CARE_PROVIDER_SITE_OTHER): Payer: Self-pay | Admitting: Family

## 2016-09-19 MED ORDER — DOXYCYCLINE HYCLATE 100 MG PO TABS: 100.0000 mg | ORAL_TABLET | Freq: Two times a day (BID) | ORAL | 0 refills | Status: DC

## 2016-09-19 NOTE — Addendum Note (Signed)
Addended by: Barnie Del R on: 09/19/2016 01:52 PM   Modules accepted: Orders

## 2016-09-22 ENCOUNTER — Encounter (HOSPITAL_COMMUNITY): Payer: Self-pay | Admitting: *Deleted

## 2016-09-22 NOTE — Progress Notes (Signed)
Pt denies SOB, chest pain, and being under the care of a cardiologist. Pt denies having a stress test and cardiac cath. Pt denies having a chest x ray and EKG within the last year. Pt denies having recent labs. Pt made aware to stop taking vitamins, fish oil, and herbal medications. Do not take any NSAIDs ie: Ibuprofen, Advil, Naproxen, BC and Goody Powder. Pt made aware to take only 16 units of Lantus insulin tomorrow night. Pt made aware to check BG every 2 hours prior to arrival on DOS, if BG < 70 treat  with 4 ounces of apple or cranberry juice, wait 15 minutes after drinking juice and recheck BG, if BG remains < 70 call SS unit.  Pt verbalized understanding of all pre-op instructions.

## 2016-09-22 NOTE — Progress Notes (Signed)
   09/22/16 1735  OBSTRUCTIVE SLEEP APNEA  Have you ever been diagnosed with sleep apnea through a sleep study? No  Do you snore loudly (loud enough to be heard through closed doors)?  1  Do you often feel tired, fatigued, or sleepy during the daytime (such as falling asleep during driving or talking to someone)? 0  Has anyone observed you stop breathing during your sleep? 1  Do you have, or are you being treated for high blood pressure? 1  BMI more than 35 kg/m2? 1  Age > 50 (1-yes) 1  Male Gender (Yes=1) 1  Obstructive Sleep Apnea Score 6

## 2016-09-23 MED ORDER — DEXTROSE 5 % IV SOLN
3.0000 g | INTRAVENOUS | Status: AC
  Administered 2016-09-24: 3 g via INTRAVENOUS
  Filled 2016-09-23: qty 3000

## 2016-09-24 ENCOUNTER — Ambulatory Visit (HOSPITAL_COMMUNITY)
Admission: RE | Admit: 2016-09-24 | Discharge: 2016-09-24 | Disposition: A | Discharge status: Home / Self Care | Payer: Medicaid Other | Source: Ambulatory Visit | Attending: Orthopedic Surgery | Admitting: Orthopedic Surgery

## 2016-09-24 ENCOUNTER — Ambulatory Visit (HOSPITAL_COMMUNITY): Payer: Medicaid Other | Admitting: Anesthesiology

## 2016-09-24 ENCOUNTER — Encounter (HOSPITAL_COMMUNITY): Payer: Self-pay | Admitting: *Deleted

## 2016-09-24 ENCOUNTER — Encounter (HOSPITAL_COMMUNITY)
Admission: RE | Disposition: A | Discharge status: Home / Self Care | Payer: Self-pay | Source: Ambulatory Visit | Attending: Orthopedic Surgery

## 2016-09-24 DIAGNOSIS — E1140 Type 2 diabetes mellitus with diabetic neuropathy, unspecified: Secondary | ICD-10-CM | POA: Diagnosis not present

## 2016-09-24 DIAGNOSIS — M869 Osteomyelitis, unspecified: Secondary | ICD-10-CM | POA: Insufficient documentation

## 2016-09-24 DIAGNOSIS — E785 Hyperlipidemia, unspecified: Secondary | ICD-10-CM | POA: Diagnosis not present

## 2016-09-24 DIAGNOSIS — Z6839 Body mass index (BMI) 39.0-39.9, adult: Secondary | ICD-10-CM | POA: Insufficient documentation

## 2016-09-24 DIAGNOSIS — M86171 Other acute osteomyelitis, right ankle and foot: Secondary | ICD-10-CM

## 2016-09-24 DIAGNOSIS — Z89421 Acquired absence of other right toe(s): Secondary | ICD-10-CM | POA: Insufficient documentation

## 2016-09-24 DIAGNOSIS — Z89411 Acquired absence of right great toe: Secondary | ICD-10-CM | POA: Diagnosis not present

## 2016-09-24 DIAGNOSIS — Z87891 Personal history of nicotine dependence: Secondary | ICD-10-CM | POA: Insufficient documentation

## 2016-09-24 DIAGNOSIS — Z89412 Acquired absence of left great toe: Secondary | ICD-10-CM | POA: Insufficient documentation

## 2016-09-24 DIAGNOSIS — E1169 Type 2 diabetes mellitus with other specified complication: Secondary | ICD-10-CM | POA: Insufficient documentation

## 2016-09-24 HISTORY — DX: Osteomyelitis, unspecified: M86.9

## 2016-09-24 HISTORY — PX: AMPUTATION: SHX166

## 2016-09-24 LAB — CBC
HEMATOCRIT: 39.8 % (ref 39.0–52.0)
Hemoglobin: 12.5 g/dL — ABNORMAL LOW (ref 13.0–17.0)
MCH: 25.6 pg — AB (ref 26.0–34.0)
MCHC: 31.4 g/dL (ref 30.0–36.0)
MCV: 81.4 fL (ref 78.0–100.0)
Platelets: 251 10*3/uL (ref 150–400)
RBC: 4.89 MIL/uL (ref 4.22–5.81)
RDW: 13.7 % (ref 11.5–15.5)
WBC: 8.5 10*3/uL (ref 4.0–10.5)

## 2016-09-24 LAB — GLUCOSE, CAPILLARY
GLUCOSE-CAPILLARY: 130 mg/dL — AB (ref 65–99)
Glucose-Capillary: 103 mg/dL — ABNORMAL HIGH (ref 65–99)
Glucose-Capillary: 105 mg/dL — ABNORMAL HIGH (ref 65–99)

## 2016-09-24 LAB — BASIC METABOLIC PANEL
ANION GAP: 7 (ref 5–15)
BUN: 20 mg/dL (ref 6–20)
CHLORIDE: 106 mmol/L (ref 101–111)
CO2: 23 mmol/L (ref 22–32)
Calcium: 8.7 mg/dL — ABNORMAL LOW (ref 8.9–10.3)
Creatinine, Ser: 1.09 mg/dL (ref 0.61–1.24)
GFR calc Af Amer: >60 mL/min (ref >60)
GFR calc non Af Amer: >60 mL/min (ref >60)
GLUCOSE: 126 mg/dL — AB (ref 65–99)
Potassium: 4.4 mmol/L (ref 3.5–5.1)
Sodium: 136 mmol/L (ref 135–145)

## 2016-09-24 SURGERY — AMPUTATION DIGIT
Anesthesia: General | Laterality: Right

## 2016-09-24 MED ORDER — LIDOCAINE HCL (PF) 1 % IJ SOLN
INTRAMUSCULAR | Status: AC
  Filled 2016-09-24: qty 30

## 2016-09-24 MED ORDER — CHLORHEXIDINE GLUCONATE 4 % EX LIQD: 60.0000 mL | Freq: Once | CUTANEOUS | Status: DC

## 2016-09-24 MED ORDER — MIDAZOLAM HCL 2 MG/2ML IJ SOLN
INTRAMUSCULAR | Status: AC
  Administered 2016-09-24: 2 mg
  Filled 2016-09-24: qty 2

## 2016-09-24 MED ORDER — OXYCODONE-ACETAMINOPHEN 5-325 MG PO TABS: 1.0000 | ORAL_TABLET | ORAL | 0 refills | Status: DC | PRN

## 2016-09-24 MED ORDER — FENTANYL CITRATE (PF) 100 MCG/2ML IJ SOLN
INTRAMUSCULAR | Status: AC
  Administered 2016-09-24: 100 ug
  Filled 2016-09-24: qty 2

## 2016-09-24 MED ORDER — PROPOFOL 10 MG/ML IV BOLUS
INTRAVENOUS | Status: DC | PRN
  Administered 2016-09-24: 20 mg via INTRAVENOUS
  Administered 2016-09-24: 30 mg via INTRAVENOUS

## 2016-09-24 MED ORDER — ONDANSETRON HCL 4 MG/2ML IJ SOLN
INTRAMUSCULAR | Status: DC | PRN
  Administered 2016-09-24: 4 mg via INTRAVENOUS

## 2016-09-24 MED ORDER — PROPOFOL 10 MG/ML IV BOLUS
INTRAVENOUS | Status: AC
  Filled 2016-09-24: qty 20

## 2016-09-24 MED ORDER — LIDOCAINE HCL 1 % IJ SOLN
INTRAMUSCULAR | Status: DC | PRN
  Administered 2016-09-24: 10 mL

## 2016-09-24 MED ORDER — FENTANYL CITRATE (PF) 250 MCG/5ML IJ SOLN
INTRAMUSCULAR | Status: AC
  Filled 2016-09-24: qty 5

## 2016-09-24 MED ORDER — LACTATED RINGERS IV SOLN
INTRAVENOUS | Status: DC
  Administered 2016-09-24: 10:00:00 via INTRAVENOUS

## 2016-09-24 MED ORDER — FENTANYL CITRATE (PF) 100 MCG/2ML IJ SOLN
INTRAMUSCULAR | Status: DC | PRN
  Administered 2016-09-24 (×2): 50 ug via INTRAVENOUS

## 2016-09-24 SURGICAL SUPPLY — 27 items
BLADE SURG 21 STRL SS (BLADE) ×2 IMPLANT
BNDG COHESIVE 4X5 TAN STRL (GAUZE/BANDAGES/DRESSINGS) ×4 IMPLANT
BNDG ESMARK 4X9 LF (GAUZE/BANDAGES/DRESSINGS) IMPLANT
BNDG GAUZE ELAST 4 BULKY (GAUZE/BANDAGES/DRESSINGS) ×4 IMPLANT
COVER SURGICAL LIGHT HANDLE (MISCELLANEOUS) ×2 IMPLANT
DRAPE U-SHAPE 47X51 STRL (DRAPES) ×2 IMPLANT
DRSG ADAPTIC 3X8 NADH LF (GAUZE/BANDAGES/DRESSINGS) ×2 IMPLANT
DRSG PAD ABDOMINAL 8X10 ST (GAUZE/BANDAGES/DRESSINGS) ×2 IMPLANT
DURAPREP 26ML APPLICATOR (WOUND CARE) ×2 IMPLANT
ELECT REM PT RETURN 9FT ADLT (ELECTROSURGICAL) ×2
ELECTRODE REM PT RTRN 9FT ADLT (ELECTROSURGICAL) ×1 IMPLANT
GAUZE SPONGE 4X4 12PLY STRL (GAUZE/BANDAGES/DRESSINGS) IMPLANT
GLOVE BIOGEL PI IND STRL 9 (GLOVE) ×1 IMPLANT
GLOVE BIOGEL PI INDICATOR 9 (GLOVE) ×1
GLOVE SURG ORTHO 9.0 STRL STRW (GLOVE) ×2 IMPLANT
GOWN STRL REUS W/ TWL XL LVL3 (GOWN DISPOSABLE) ×2 IMPLANT
GOWN STRL REUS W/TWL XL LVL3 (GOWN DISPOSABLE) ×2
KIT BASIN OR (CUSTOM PROCEDURE TRAY) ×2 IMPLANT
KIT ROOM TURNOVER OR (KITS) ×2 IMPLANT
MANIFOLD NEPTUNE II (INSTRUMENTS) ×2 IMPLANT
NEEDLE 22X1 1/2 (OR ONLY) (NEEDLE) ×2 IMPLANT
NS IRRIG 1000ML POUR BTL (IV SOLUTION) ×2 IMPLANT
PACK ORTHO EXTREMITY (CUSTOM PROCEDURE TRAY) ×2 IMPLANT
PAD ARMBOARD 7.5X6 YLW CONV (MISCELLANEOUS) ×4 IMPLANT
SUT ETHILON 2 0 PSLX (SUTURE) ×2 IMPLANT
SYR CONTROL 10ML LL (SYRINGE) ×2 IMPLANT
TOWEL OR 17X26 10 PK STRL BLUE (TOWEL DISPOSABLE) ×2 IMPLANT

## 2016-09-24 NOTE — Anesthesia Postprocedure Evaluation (Addendum)
Anesthesia Post Note  Patient: [name]  Procedure(s) Performed: Procedure(s) (LRB): Right 5th Toe Amputation (Right)  Patient location during evaluation: PACU Anesthesia Type: MAC Level of consciousness: awake Pain management: pain level controlled Vital Signs Assessment: post-procedure vital signs reviewed and stable Respiratory status: spontaneous breathing Cardiovascular status: stable Postop Assessment: no signs of nausea or vomiting Anesthetic complications: no        Last Vitals:  Vitals:   09/24/16 1145 09/24/16 1350  BP: 130/67 (!) 142/74  Pulse: 65 72  Resp: 10 13  Temp:      Last Pain:  Vitals:   09/24/16 0933  TempSrc: Oral   Pain Goal: Patients Stated Pain Goal: 9 (09/24/16 0933)               [place]

## 2016-09-24 NOTE — Anesthesia Preprocedure Evaluation (Addendum)
Anesthesia Evaluation  Patient identified by MRN, date of birth, ID band Patient awake    Reviewed: Allergy & Precautions, NPO status , Patient's Chart, lab work & pertinent test results  Airway Mallampati: II       Dental   Pulmonary former smoker,    Pulmonary exam normal        Cardiovascular hypertension, Normal cardiovascular exam     Neuro/Psych    GI/Hepatic   Endo/Other  diabetesMorbid obesity  Renal/GU      Musculoskeletal   Abdominal (+) + obese,   Peds  Hematology   Anesthesia Other Findings   Reproductive/Obstetrics                            Anesthesia Physical Anesthesia Plan  ASA: III  Anesthesia Plan: MAC   Post-op Pain Management:  Regional for Post-op pain   Induction: Intravenous  Airway Management Planned: Natural Airway and Simple Face Mask  Additional Equipment:   Intra-op Plan:   Post-operative Plan:   Informed Consent: I have reviewed the patients History and Physical, chart, labs and discussed the procedure including the risks, benefits and alternatives for the proposed anesthesia with the patient or authorized representative who has indicated his/her understanding and acceptance.     Plan Discussed with: CRNA and Surgeon  Anesthesia Plan Comments:        Anesthesia Quick Evaluation

## 2016-09-24 NOTE — Op Note (Signed)
09/24/2016  1:38 PM  PATIENT:  [name]    PRE-OPERATIVE DIAGNOSIS:  Osteomyelitis Right 5th Toe  POST-OPERATIVE DIAGNOSIS:  Same  PROCEDURE:  Right 5th Ray Amputation  SURGEON:  [name], MD  PHYSICIAN ASSISTANT:None ANESTHESIA:   General  PREOPERATIVE INDICATIONS:  [name] is a  56 y.o. male with a diagnosis of Osteomyelitis Right 5th Toe who failed conservative measures and elected for surgical management.    The risks benefits and alternatives were discussed with the patient preoperatively including but not limited to the risks of infection, bleeding, nerve injury, cardiopulmonary complications, the need for revision surgery, among others, and the patient was willing to proceed.  OPERATIVE IMPLANTS: none  OPERATIVE FINDINGS: no abscess  OPERATIVE PROCEDURE: Patient was brought to the operating room after undergoing a regional anesthetic. The right lower extremity was prepped using DuraPrep draped into a sterile field a timeout was called. The area was locally infiltrated with 10 mL of 1% lidocaine plain. A racquet incision was made around the ulcerative fifth toe the fifth metatarsal was resected through the base of the toe ulcerative tissue and metatarsal were resected in 1 block of tissue. Electrocautery was used for hemostasis. The wounds irrigated with normal saline. There is no deep abscess there was good petechial bleeding. A compressive wrap was applied up to the tibial tubercle with Coban and. A compressive wrap was also applied to the right lower extremity. Patient was taken the PACU in stable condition plan for discharge to home.

## 2016-09-24 NOTE — H&P (Signed)
[name] is an 56 y.o. male.   Chief Complaint: Osteomyelitis right little toe HPI: Patient is a 56 year old gentleman diabetic insensate neuropathy status post multiple toe amputations who presents at this time after failure of conservative treatment for amputation of right little toe due to osteomyelitis.  Past Medical History:  Diagnosis Date  . Blood transfusion 2009  . Diabetes mellitus    type 2  . Hyperlipemia   . Hypertension    denies  . Leg swelling   . Osteomyelitis (HCC)    right 5th toe  . Visual disturbance     Past Surgical History:  Procedure Laterality Date  . AMPUTATION Left 2012   Left Great Toe  . AMPUTATION Right 04/20/2015   Procedure: Right Great Toe Amputation;  Surgeon: [name], MD;  Location: [facility] OR;  Service: Orthopedics;  Laterality: Right;  . AMPUTATION Right 07/27/2015   Procedure: Right 2nd Toe Amputation;  Surgeon: [name], MD;  Location: [facility] OR;  Service: Orthopedics;  Laterality: Right;  . CHOLECYSTECTOMY  03/2011  . EYE SURGERY     lazer  . foot surgery  11/2007   small toe on left foot removed.  Marland Kitchen NASAL SEPTUM SURGERY  1981   Due to broken nose. Surgery was to repair how bone healed.  Marland Kitchen PARS PLANA VITRECTOMY  11/17/2011   Procedure: PARS PLANA VITRECTOMY WITH 23 GAUGE;  Surgeon: Shade Flood, MD;  Location: [facility] OR;  Service: Ophthalmology;  Laterality: Right;  With Endolaser  . TONSILLECTOMY      Family History  Problem Relation Age of Onset  . Cancer Mother     breast   Social History:  reports that he quit smoking about 37 years ago. His smoking use included Cigarettes. He has never used smokeless tobacco. He reports that he drinks alcohol. He reports that he does not use drugs.  Allergies:  Allergies  Allergen Reactions  . No Known Allergies     No prescriptions prior to admission.    No results found for this or any previous visit (from the past 48 hour(s)). No results found.  Review of Systems  All other  systems reviewed and are negative.   There were no vitals taken for this visit. Physical Exam  On examination patient is alert oriented no adenopathy well-dressed normal affect normal respiratory effort he has an antalgic gait. Examination patient has ulceration which probes to bone radiographs which show chronic osteomyelitis of the right little toe he has multiple calluses and blisters on the plantar aspect of both feet without infection. Assessment/Plan Assessment: Diabetic insensate neuropathy osteomyelitis Wagner grade 3 ulceration right little toe.  Plan: We'll plan for right little toe amputation. Risk and benefits were discussed including potential for further amputations. Patient states he understands wishes to proceed at this time.  [name], MD 09/24/2016, 6:41 AM

## 2016-09-24 NOTE — Anesthesia Procedure Notes (Addendum)
Anesthesia Regional Block: Popliteal block   Pre-Anesthetic Checklist: ,, timeout performed, Correct Patient, Correct Site, Correct Laterality, Correct Procedure, Correct Position, site marked, Risks and benefits discussed,  Surgical consent,  Pre-op evaluation,  At surgeon's request and post-op pain management  Laterality: Right and Upper  Prep: chloraprep       Needles:  Injection technique: Single-shot  Needle Type: Echogenic Stimulator Needle     Needle Length: 4cm  Needle Gauge: 21   Needle insertion depth: 3 cm   Additional Needles:   Procedures: ultrasound guided,,,,,,,,  Narrative:  Start time: 09/24/2016 11:24 AM End time: 09/24/2016 11:34 AM Injection made incrementally with aspirations every 5 mL.  Performed by: Personally  Anesthesiologist: Leilani Able

## 2016-09-24 NOTE — Transfer of Care (Signed)
Immediate Anesthesia Transfer of Care Note  Patient: [name]  Procedure(s) Performed: Procedure(s): Right 5th Toe Amputation (Right)  Patient Location: PACU  Anesthesia Type:MAC and Regional  Level of Consciousness: awake, alert  and oriented  Airway & Oxygen Therapy: Patient Spontanous Breathing  Post-op Assessment: Report given to RN and Post -op Vital signs reviewed and stable  Post vital signs: Reviewed and stable  Last Vitals:  Vitals:   09/24/16 1140 09/24/16 1145  BP: 127/63 130/67  Pulse: 67 65  Resp: 14 10  Temp:      Last Pain:  Vitals:   09/24/16 0933  TempSrc: Oral      Patients Stated Pain Goal: 9 (83/38/25 0539)  Complications: No apparent anesthesia complications

## 2016-09-25 ENCOUNTER — Encounter (HOSPITAL_COMMUNITY): Payer: Self-pay | Admitting: Orthopedic Surgery

## 2016-09-25 LAB — HEMOGLOBIN A1C
HEMOGLOBIN A1C: 7.1 % — AB (ref 4.8–5.6)
MEAN PLASMA GLUCOSE: 157 mg/dL

## 2016-10-01 ENCOUNTER — Encounter (INDEPENDENT_AMBULATORY_CARE_PROVIDER_SITE_OTHER): Payer: Self-pay | Admitting: Family

## 2016-10-01 ENCOUNTER — Ambulatory Visit (INDEPENDENT_AMBULATORY_CARE_PROVIDER_SITE_OTHER): Payer: Medicaid Other | Admitting: Family

## 2016-10-01 VITALS — Ht 69.0 in | Wt 265.0 lb

## 2016-10-01 DIAGNOSIS — S98131A Complete traumatic amputation of one right lesser toe, initial encounter: Secondary | ICD-10-CM

## 2016-10-01 DIAGNOSIS — Z89421 Acquired absence of other right toe(s): Secondary | ICD-10-CM

## 2016-10-01 NOTE — Progress Notes (Signed)
Office Visit Note   Patient: [name]           Date of Birth: 02/25/1961           MRN: [id number] Visit Date: 10/01/2016              Requested by: Fleet Contras, MD [address] [place], [place] [address] PCP: Dorrene German, MD  Chief Complaint  Patient presents with  . Right Foot - Routine Post Op    09/24/16 right 5th toe amputation       HPI: The patient is 56 year old gentleman who presents today in follow-up for amputation right fifth toe on April 11. Sutures remain in place. The patient is full weightbearing with a walker and a postop shoe bilaterally.  Assessment & Plan: Visit Diagnoses:  1. Amputated toe of right foot (HCC)     Plan: resume Garment. Follow-up in office in 1 week for suture removal. He will begin daily Dial soap cleansing. May shower. Cover with a dry dressing daily.  Follow-Up Instructions: Return in about 1 week (around 10/08/2016).   Ortho Exam  Patient is alert, oriented, no adenopathy, well-dressed, normal affect, normal respiratory effort. Right foot fifth ray amputation is well approximated with sutures this is healing well. There is no gaping no drainage no odor no sign of infection. Does have moderate swelling to the right lower extremity.  Imaging: No results found.  Labs: Lab Results  Component Value Date   HGBA1C 7.1 (H) 09/24/2016   HGBA1C 6.8 (H) 06/17/2014   HGBA1C (H) 11/26/2006    10.4 (NOTE)   The ADA recommends the following therapeutic goals for glycemic   control related to Hgb A1C measurement:   Goal of Therapy:   < 7.0% Hgb A1C   Action Suggested:  > 8.0% Hgb A1C   Ref:  Diabetes Care, 22, Suppl. 1, 1999   ESRSEDRATE 45 (H) 06/17/2014   CRP 1.5 (H) 06/17/2014   REPTSTATUS 06/19/2014 FINAL 06/17/2014   GRAMSTAIN  06/17/2014    RARE WBC PRESENT, PREDOMINANTLY PMN NO SQUAMOUS EPITHELIAL CELLS SEEN NO ORGANISMS SEEN Performed at [facility]    CULT  06/17/2014    MULTIPLE ORGANISMS PRESENT, NONE  PREDOMINANT Note: NO STAPHYLOCOCCUS AUREUS ISOLATED NO GROUP A STREP (S.PYOGENES) ISOLATED Performed at [facility]    LABORGA STAPHYLOCOCCUS AUREUS 11/16/2007   LABORGA MORGANELLA MORGANII 11/16/2007    Orders:  No orders of the defined types were placed in this encounter.  No orders of the defined types were placed in this encounter.    Procedures: No procedures performed  Clinical Data: No additional findings.  ROS:  All other systems negative, except as noted in the HPI. Review of Systems  Constitutional: Negative for chills and fever.  Cardiovascular: Positive for leg swelling.  Skin: Positive for wound.    Objective: Vital Signs: Ht  (1.753 m)   Wt 265 lb (120.2 kg)   BMI 39.13 kg/m   Specialty Comments:  No specialty comments available.  PMFS History: Patient Active Problem List   Diagnosis Date Noted  . Acute osteomyelitis of toe, right (HCC)   . Diabetic polyneuropathy associated with type 2 diabetes mellitus (HCC) 08/14/2016  . Chronic venous hypertension (idiopathic) with ulcer and inflammation of left lower extremity (HCC) 08/14/2016  . Ulcer of left heel, limited to breakdown of skin (HCC) 05/15/2016  . Amputated toe of right foot (HCC) 07/27/2015  . Great toe pain 06/17/2014  . Type II diabetes  mellitus with peripheral circulatory disorder (HCC) 06/17/2014  . Elevated transaminase level 06/17/2014  . HTN (hypertension) 06/17/2014  . Visual disturbance    Past Medical History:  Diagnosis Date  . Blood transfusion 2009  . Diabetes mellitus    type 2  . Hyperlipemia   . Hypertension    denies  . Leg swelling   . Osteomyelitis (HCC)    right 5th toe  . Visual disturbance     Family History  Problem Relation Age of Onset  . Cancer Mother     breast    Past Surgical History:  Procedure Laterality Date  . AMPUTATION Left 2012   Left Great Toe  . AMPUTATION Right 04/20/2015   Procedure: Right Great Toe Amputation;  Surgeon:  [name], MD;  Location: [facility] OR;  Service: Orthopedics;  Laterality: Right;  . AMPUTATION Right 07/27/2015   Procedure: Right 2nd Toe Amputation;  Surgeon: [name], MD;  Location: [facility] OR;  Service: Orthopedics;  Laterality: Right;  . AMPUTATION Right 09/24/2016   Procedure: Right 5th Toe Amputation;  Surgeon: [name], MD;  Location: [facility] OR;  Service: Orthopedics;  Laterality: Right;  . CHOLECYSTECTOMY  03/2011  . EYE SURGERY     lazer  . foot surgery  11/2007   small toe on left foot removed.  Marland Kitchen NASAL SEPTUM SURGERY  1981   Due to broken nose. Surgery was to repair how bone healed.  Marland Kitchen PARS PLANA VITRECTOMY  11/17/2011   Procedure: PARS PLANA VITRECTOMY WITH 23 GAUGE;  Surgeon: Shade Flood, MD;  Location: [facility] OR;  Service: Ophthalmology;  Laterality: Right;  With Endolaser  . TONSILLECTOMY     Social History   Occupational History  . Not on file.   Social History Main Topics  . Smoking status: Former Smoker    Types: Cigarettes    Quit date: 03/25/1979  . Smokeless tobacco: Never Used  . Alcohol use Yes     Comment: occasional  6 pk of malt liqour a month  . Drug use: No  . Sexual activity: Not on file

## 2016-10-10 ENCOUNTER — Telehealth (INDEPENDENT_AMBULATORY_CARE_PROVIDER_SITE_OTHER): Payer: Self-pay | Admitting: Orthopedic Surgery

## 2016-10-10 NOTE — Telephone Encounter (Signed)
09/18/2016 PROGRESS NOTE FAXED TO [facility]

## 2016-10-13 ENCOUNTER — Encounter (INDEPENDENT_AMBULATORY_CARE_PROVIDER_SITE_OTHER): Payer: Self-pay | Admitting: Orthopedic Surgery

## 2016-10-13 ENCOUNTER — Ambulatory Visit (INDEPENDENT_AMBULATORY_CARE_PROVIDER_SITE_OTHER): Payer: Medicaid Other | Admitting: Orthopedic Surgery

## 2016-10-13 VITALS — Ht 69.0 in | Wt 265.0 lb

## 2016-10-13 DIAGNOSIS — L97421 Non-pressure chronic ulcer of left heel and midfoot limited to breakdown of skin: Secondary | ICD-10-CM

## 2016-10-13 DIAGNOSIS — S98131A Complete traumatic amputation of one right lesser toe, initial encounter: Secondary | ICD-10-CM

## 2016-10-13 DIAGNOSIS — Z89421 Acquired absence of other right toe(s): Secondary | ICD-10-CM

## 2016-10-13 NOTE — Progress Notes (Signed)
Office Visit Note   Patient: [name]           Date of Birth: July 13, 1960           MRN: [id number] Visit Date: 10/13/2016              Requested by: Fleet Contras, MD [address] [place], [place] [address] PCP: Dorrene German, MD  Chief Complaint  Patient presents with  . Right Foot - Routine Post Op    09/24/16 right 5th toe amputation       HPI: Patient has a chronic left heel decubitus ulcer as well as status post right foot fifth ray amputation.  Assessment & Plan: Visit Diagnoses:  1. Amputated toe of right foot (HCC)   2. Ulcer of left heel, limited to breakdown of skin (HCC)     Plan: Sutures are harvested from the right foot the incision is healed nicely. The left heel ulcer was debrided of skin and soft tissue. Patient will follow up with [employer] for new extra-depth shoe with support under the midfoot to unload the heel.  Follow-Up Instructions: Return in about 2 weeks (around 10/27/2016).   Ortho Exam  Patient is alert, oriented, no adenopathy, well-dressed, normal affect, normal respiratory effort. Examination both feet have no cellulitis. The ulcer was debrided of skin and soft tissue from the left foot with a 10 blade knife there is good granulation tissue the ulcer is approximately 10 mm in diameter no signs of infection. Iodosorb and a Band-Aid was applied. Patient will continue with antibiotic ointment and dressing changes to left foot right foot he will continue with his compression stocking patient does have very small increased venous ulcers in both legs importance of elevation was discussed continue his medical compression socks patient may require compression wraps if we do not see improvement in follow-up.  Imaging: No results found.  Labs: Lab Results  Component Value Date   HGBA1C 7.1 (H) 09/24/2016   HGBA1C 6.8 (H) 06/17/2014   HGBA1C (H) 11/26/2006    10.4 (NOTE)   The ADA recommends the following therapeutic goals for glycemic    control related to Hgb A1C measurement:   Goal of Therapy:   < 7.0% Hgb A1C   Action Suggested:  > 8.0% Hgb A1C   Ref:  Diabetes Care, 22, Suppl. 1, 1999   ESRSEDRATE 45 (H) 06/17/2014   CRP 1.5 (H) 06/17/2014   REPTSTATUS 06/19/2014 FINAL 06/17/2014   GRAMSTAIN  06/17/2014    RARE WBC PRESENT, PREDOMINANTLY PMN NO SQUAMOUS EPITHELIAL CELLS SEEN NO ORGANISMS SEEN Performed at [facility]    CULT  06/17/2014    MULTIPLE ORGANISMS PRESENT, NONE PREDOMINANT Note: NO STAPHYLOCOCCUS AUREUS ISOLATED NO GROUP A STREP (S.PYOGENES) ISOLATED Performed at [facility]    LABORGA STAPHYLOCOCCUS AUREUS 11/16/2007   LABORGA MORGANELLA MORGANII 11/16/2007    Orders:  No orders of the defined types were placed in this encounter.  No orders of the defined types were placed in this encounter.    Procedures: No procedures performed  Clinical Data: No additional findings.  ROS:  All other systems negative, except as noted in the HPI. Review of Systems  Objective: Vital Signs: Ht  (1.753 m)   Wt 265 lb (120.2 kg)   BMI 39.13 kg/m   Specialty Comments:  No specialty comments available.  PMFS History: Patient Active Problem List   Diagnosis Date Noted  . Acute osteomyelitis of toe, right (HCC)   .  Diabetic polyneuropathy associated with type 2 diabetes mellitus (HCC) 08/14/2016  . Chronic venous hypertension (idiopathic) with ulcer and inflammation of left lower extremity (HCC) 08/14/2016  . Ulcer of left heel, limited to breakdown of skin (HCC) 05/15/2016  . Amputated toe of right foot (HCC) 07/27/2015  . Great toe pain 06/17/2014  . Type II diabetes mellitus with peripheral circulatory disorder (HCC) 06/17/2014  . Elevated transaminase level 06/17/2014  . HTN (hypertension) 06/17/2014  . Visual disturbance    Past Medical History:  Diagnosis Date  . Blood transfusion 2009  . Diabetes mellitus    type 2  . Hyperlipemia   . Hypertension    denies  .  Leg swelling   . Osteomyelitis (HCC)    right 5th toe  . Visual disturbance     Family History  Problem Relation Age of Onset  . Cancer Mother     breast    Past Surgical History:  Procedure Laterality Date  . AMPUTATION Left 2012   Left Great Toe  . AMPUTATION Right 04/20/2015   Procedure: Right Great Toe Amputation;  Surgeon: [name], MD;  Location[facility];  Service: Orthopedics;  Laterality: Right;  . AMPUTATION Right 07/27/2015   Procedure: Right 2nd Toe Amputation;  Surgeon: [name], MD;  Location: [facility] OR;  Service: Orthopedics;  Laterality: Right;  . AMPUTATION Right 09/24/2016   Procedure: Right 5th Toe Amputation;  Surgeon: [name], MD;  Location: [facility] OR;  Service: Orthopedics;  Laterality: Right;  . CHOLECYSTECTOMY  03/2011  . EYE SURGERY     lazer  . foot surgery  11/2007   small toe on left foot removed.  Marland Kitchen NASAL SEPTUM SURGERY  1981   Due to broken nose. Surgery was to repair how bone healed.  Marland Kitchen PARS PLANA VITRECTOMY  11/17/2011   Procedure: PARS PLANA VITRECTOMY WITH 23 GAUGE;  Surgeon: Shade Flood, MD;  Location: [facility] OR;  Service: Ophthalmology;  Laterality: Right;  With Endolaser  . TONSILLECTOMY     Social History   Occupational History  . Not on file.   Social History Main Topics  . Smoking status: Former Smoker    Types: Cigarettes    Quit date: 03/25/1979  . Smokeless tobacco: Never Used  . Alcohol use Yes     Comment: occasional  6 pk of malt liqour a month  . Drug use: No  . Sexual activity: Not on file

## 2016-10-27 ENCOUNTER — Encounter (INDEPENDENT_AMBULATORY_CARE_PROVIDER_SITE_OTHER): Payer: Self-pay | Admitting: Orthopedic Surgery

## 2016-10-27 ENCOUNTER — Ambulatory Visit (INDEPENDENT_AMBULATORY_CARE_PROVIDER_SITE_OTHER): Payer: Medicaid Other | Admitting: Family

## 2016-10-27 ENCOUNTER — Encounter (INDEPENDENT_AMBULATORY_CARE_PROVIDER_SITE_OTHER): Payer: Self-pay

## 2016-10-27 VITALS — Ht 69.0 in | Wt 265.0 lb

## 2016-10-27 DIAGNOSIS — L97421 Non-pressure chronic ulcer of left heel and midfoot limited to breakdown of skin: Secondary | ICD-10-CM | POA: Diagnosis not present

## 2016-10-27 DIAGNOSIS — I87323 Chronic venous hypertension (idiopathic) with inflammation of bilateral lower extremity: Secondary | ICD-10-CM | POA: Diagnosis not present

## 2016-10-27 DIAGNOSIS — S98131A Complete traumatic amputation of one right lesser toe, initial encounter: Secondary | ICD-10-CM

## 2016-10-27 DIAGNOSIS — Z89421 Acquired absence of other right toe(s): Secondary | ICD-10-CM

## 2016-10-27 NOTE — Progress Notes (Signed)
Office Visit Note   Patient: [name]           Date of Birth: Sep 13, 1960           MRN: [id number] Visit Date: 10/27/2016              Requested by: Fleet Contras, MD [address] [place], [place] [address] PCP: Fleet Contras, MD  Chief Complaint  Patient presents with  . Right Foot - Follow-up    09/24/16 Right 5th Toe Amputation   . Left Foot - Wound Check    Left plantar heel wound      HPI: Patient has a chronic left heel ulceration as well as status post right foot fifth ray amputation.Him today he is in custom shoe wear custom orthotics with extra-depth shoes.   Assessment & Plan: Visit Diagnoses:  No diagnosis found.  Plan: Continue with protective shoe wear. Continue daily wound cleansing and dressings. Follow up in 4 weeks. Recommended continued wear of his compression garments. Patient has a history of noncompliance with these.  Follow-Up Instructions: No Follow-up on file.   Ortho Exam  Patient is alert, oriented, no adenopathy, well dressed. Bilateral lower extremities with him 1+ pitting edema. The left heel plantar ulcer has good granulation tissue the ulcer is approximately 10 mm in diameter no signs of infection. Superficial callus. No debridement today.  Iodosorb and a Band-Aid was applied. Right foot is 4 weeks status post fifth toe amputation this is healing slowly. There is a 4 mm in diameter opening's distally this has no depth. There is granulation tissue in the wound bed. No drainage  Imaging: No results found.  Labs: Lab Results  Component Value Date   HGBA1C 7.1 (H) 09/24/2016   HGBA1C 6.8 (H) 06/17/2014   HGBA1C (H) 11/26/2006    10.4 (NOTE)   The ADA recommends the following therapeutic goals for glycemic   control related to Hgb A1C measurement:   Goal of Therapy:   < 7.0% Hgb A1C   Action Suggested:  > 8.0% Hgb A1C   Ref:  Diabetes Care, 22, Suppl. 1, 1999   ESRSEDRATE 45 (H) 06/17/2014   CRP 1.5 (H) 06/17/2014   REPTSTATUS  06/19/2014 FINAL 06/17/2014   GRAMSTAIN  06/17/2014    RARE WBC PRESENT, PREDOMINANTLY PMN NO SQUAMOUS EPITHELIAL CELLS SEEN NO ORGANISMS SEEN Performed at [facility]    CULT  06/17/2014    MULTIPLE ORGANISMS PRESENT, NONE PREDOMINANT Note: NO STAPHYLOCOCCUS AUREUS ISOLATED NO GROUP A STREP (S.PYOGENES) ISOLATED Performed at [facility]    LABORGA STAPHYLOCOCCUS AUREUS 11/16/2007   LABORGA MORGANELLA MORGANII 11/16/2007    Orders:  No orders of the defined types were placed in this encounter.  No orders of the defined types were placed in this encounter.    Procedures: No procedures performed  Clinical Data: No additional findings.  ROS:  All other systems negative, except as noted in the HPI. Review of Systems  Constitutional: Negative for chills and fever.  Respiratory: Negative for shortness of breath.   Cardiovascular: Positive for leg swelling. Negative for chest pain.  Skin: Positive for wound. Negative for color change.    Objective: Vital Signs: Ht 5\' 9"  (1.753 m)   Wt 265 lb (120.2 kg)   BMI 39.13 kg/m   Specialty Comments:  No specialty comments available.  PMFS History: Patient Active Problem List   Diagnosis Date Noted  . Acute osteomyelitis of toe, right (HCC)   . Diabetic polyneuropathy associated  with type 2 diabetes mellitus (HCC) 08/14/2016  . Chronic venous hypertension (idiopathic) with ulcer and inflammation of left lower extremity (HCC) 08/14/2016  . Ulcer of left heel, limited to breakdown of skin (HCC) 05/15/2016  . Amputated toe of right foot (HCC) 07/27/2015  . Great toe pain 06/17/2014  . Type II diabetes mellitus with peripheral circulatory disorder (HCC) 06/17/2014  . Elevated transaminase level 06/17/2014  . HTN (hypertension) 06/17/2014  . Visual disturbance    Past Medical History:  Diagnosis Date  . Blood transfusion 2009  . Diabetes mellitus    type 2  . Hyperlipemia   . Hypertension    denies  .  Leg swelling   . Osteomyelitis (HCC)    right 5th toe  . Visual disturbance     Family History  Problem Relation Age of Onset  . Cancer Mother        breast    Past Surgical History:  Procedure Laterality Date  . AMPUTATION Left 2012   Left Great Toe  . AMPUTATION Right 04/20/2015   Procedure: Right Great Toe Amputation;  Surgeon: Nadara MustardMarcus Duda V, MD;  [facility];  Service: Orthopedics;  Laterality: Right;  . AMPUTATION Right 07/27/2015   Procedure: Right 2nd Toe Amputation;  Surgeon: Nadara MustardMarcus Duda V, MD;  [facility];  Service: Orthopedics;  Laterality: Right;  . AMPUTATION Right 09/24/2016   Procedure: Right 5th Toe Amputation;  Surgeon: Nadara MustardMarcus Duda V, MD;  [facility];  Service: Orthopedics;  Laterality: Right;  . CHOLECYSTECTOMY  03/2011  . EYE SURGERY     lazer  . foot surgery  11/2007   small toe on left foot removed.  Marland Kitchen. NASAL SEPTUM SURGERY  1981   Due to broken nose. Surgery was to repair how bone healed.  Marland Kitchen. PARS PLANA VITRECTOMY  11/17/2011   Procedure: PARS PLANA VITRECTOMY WITH 23 GAUGE;  Surgeon: Shade FloodGreer Geiger, MD;  [facility];  Service: Ophthalmology;  Laterality: Right;  With Endolaser  . TONSILLECTOMY     Social History   Occupational History  . Not on file.   Social History Main Topics  . Smoking status: Former Smoker    Types: Cigarettes    Quit date: 03/25/1979  . Smokeless tobacco: Never Used  . Alcohol use Yes     Comment: occasional  6 pk of malt liqour a month  . Drug use: No  . Sexual activity: Not on file

## 2016-11-21 NOTE — Addendum Note (Signed)
Addendum  created 11/21/16 1153 by Shan Valdes, MD   Sign clinical note    

## 2016-11-27 ENCOUNTER — Ambulatory Visit (INDEPENDENT_AMBULATORY_CARE_PROVIDER_SITE_OTHER): Payer: Medicaid Other | Admitting: Orthopedic Surgery

## 2016-11-27 DIAGNOSIS — E1142 Type 2 diabetes mellitus with diabetic polyneuropathy: Secondary | ICD-10-CM

## 2016-11-27 DIAGNOSIS — L97421 Non-pressure chronic ulcer of left heel and midfoot limited to breakdown of skin: Secondary | ICD-10-CM | POA: Diagnosis not present

## 2016-11-27 DIAGNOSIS — I87323 Chronic venous hypertension (idiopathic) with inflammation of bilateral lower extremity: Secondary | ICD-10-CM

## 2016-11-27 NOTE — Progress Notes (Signed)
Office Visit Note   Patient: [name]           Date of Birth: 1960/11/20           MRN: [id number] Visit Date: 11/27/2016              Requested by: Fleet Contras, MD [address] [place], [place] [address] PCP: Fleet Contras, MD  Chief Complaint  Patient presents with  . Left Foot - Follow-up  . Right Foot - Follow-up      HPI: Patient is a 56 year old gentleman who presents in follow-up with venous stasis insufficiency both feet chronic ulcer plantar aspect left foot as well as ulcer on the right foot. Patient has been wearing 15-20 mm compression stockings he has been given a prescription for 20-30 stockings but is not obtain these.  Assessment & Plan: Visit Diagnoses:  1. Ulcer of left heel, limited to breakdown of skin (HCC)   2. Chronic venous hypertension (idiopathic) with inflammation of bilateral lower extremity   3. Diabetic polyneuropathy associated with type 2 diabetes mellitus (HCC)     Plan: Recommend patient fill the prescription for the 20-30 compression stockings recommended that he proceed with getting a kneeling scooter so he can unload pressure from the left foot.  Follow-Up Instructions: Return in about 4 weeks (around 12/25/2016).   Ortho Exam  Patient is alert, oriented, no adenopathy, well-dressed, normal affect, normal respiratory effort. Examination patient has increased venous stasis swelling of both legs with very small wounds that are about 2 mm in diameter that had good granulation tissue. There is no cellulitis no odor no drainage. Examination of the left foot patient has increased nonviable tissue around the wound. After informed consent a 10 blade knife was used to debride the skin and soft tissue back to bleeding viable granulation tissue. This was touched with silver nitrate for hemostasis. A 2 x 2 gauze was applied with Bactroban and tape. The ulcer measures 20 x 30 mm and is 3 mm deep. Examination the right foot the ulcers have  completely healed there is complete epithelialization with no cellulitis or wounds.  Imaging: No results found.  Labs: Lab Results  Component Value Date   HGBA1C 7.1 (H) 09/24/2016   HGBA1C 6.8 (H) 06/17/2014   HGBA1C (H) 11/26/2006    10.4 (NOTE)   The ADA recommends the following therapeutic goals for glycemic   control related to Hgb A1C measurement:   Goal of Therapy:   < 7.0% Hgb A1C   Action Suggested:  > 8.0% Hgb A1C   Ref:  Diabetes Care, 22, Suppl. 1, 1999   ESRSEDRATE 45 (H) 06/17/2014   CRP 1.5 (H) 06/17/2014   REPTSTATUS 06/19/2014 FINAL 06/17/2014   GRAMSTAIN  06/17/2014    RARE WBC PRESENT, PREDOMINANTLY PMN NO SQUAMOUS EPITHELIAL CELLS SEEN NO ORGANISMS SEEN Performed at [facility]    CULT  06/17/2014    MULTIPLE ORGANISMS PRESENT, NONE PREDOMINANT Note: NO STAPHYLOCOCCUS AUREUS ISOLATED NO GROUP A STREP (S.PYOGENES) ISOLATED Performed at [facility]    LABORGA STAPHYLOCOCCUS AUREUS 11/16/2007   LABORGA MORGANELLA MORGANII 11/16/2007    Orders:  No orders of the defined types were placed in this encounter.  No orders of the defined types were placed in this encounter.    Procedures: No procedures performed  Clinical Data: No additional findings.  ROS:  All other systems negative, except as noted in the HPI. Review of Systems  Objective: Vital Signs: There were no vitals  taken for this visit.  Specialty Comments:  No specialty comments available.  PMFS History: Patient Active Problem List   Diagnosis Date Noted  . Acute osteomyelitis of toe, right (HCC)   . Diabetic polyneuropathy associated with type 2 diabetes mellitus (HCC) 08/14/2016  . 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 Ulcer of left heel, limited to breakdown of skin (HCC) 05/15/2016  . Amputated toe of right foot (HCC) 07/27/2015  . Great toe pain 06/17/2014  . Type II diabetes mellitus with  peripheral circulatory disorder (HCC) 06/17/2014  . Elevated transaminase level 06/17/2014  . HTN (hypertension) 06/17/2014  . Visual disturbance    Past Medical History:  Diagnosis Date  . Blood transfusion 2009  . Diabetes mellitus    type 2  . Hyperlipemia   . Hypertension    denies  . Leg swelling   . Osteomyelitis (HCC)    right 5th toe  . Visual disturbance     Family History  Problem Relation Age of Onset  . Cancer Mother        breast    Past Surgical History:  Procedure Laterality Date  . AMPUTATION Left 2012   Left Great Toe  . AMPUTATION Right 04/20/2015   Procedure: Right Great Toe Amputation;  Surgeon: Nadara MustardMarcus Duda V, MD;  [facility];  Service: Orthopedics;  Laterality: Right;  . AMPUTATION Right 07/27/2015   Procedure: Right 2nd Toe Amputation;  Surgeon: Nadara MustardMarcus Duda V, MD;  [facility];  Service: Orthopedics;  Laterality: Right;  . AMPUTATION Right 09/24/2016   Procedure: Right 5th Toe Amputation;  Surgeon: Nadara MustardMarcus Duda V, MD;  [facility];  Service: Orthopedics;  Laterality: Right;  . CHOLECYSTECTOMY  03/2011  . EYE SURGERY     lazer  . foot surgery  11/2007   small toe on left foot removed.  Marland Kitchen. NASAL SEPTUM SURGERY  1981   Due to broken nose. Surgery was to repair how bone healed.  Marland Kitchen. PARS PLANA VITRECTOMY  11/17/2011   Procedure: PARS PLANA VITRECTOMY WITH 23 GAUGE;  Surgeon: Shade FloodGreer Geiger, MD;  [facility];  Service: Ophthalmology;  Laterality: Right;  With Endolaser  . TONSILLECTOMY     Social History   Occupational History  . Not on file.   Social History Main Topics  . Smoking status: Former Smoker    Types: Cigarettes    Quit date: 03/25/1979  . Smokeless tobacco: Never Used  . Alcohol use Yes     Comment: occasional  6 pk of malt liqour a month  . Drug use: No  . Sexual activity: Not on file

## 2016-12-25 ENCOUNTER — Ambulatory Visit (INDEPENDENT_AMBULATORY_CARE_PROVIDER_SITE_OTHER): Payer: Medicaid Other | Admitting: Orthopedic Surgery

## 2016-12-25 ENCOUNTER — Encounter (INDEPENDENT_AMBULATORY_CARE_PROVIDER_SITE_OTHER): Payer: Self-pay | Admitting: Orthopedic Surgery

## 2016-12-25 VITALS — Ht 69.0 in | Wt 265.0 lb

## 2016-12-25 DIAGNOSIS — L97421 Non-pressure chronic ulcer of left heel and midfoot limited to breakdown of skin: Secondary | ICD-10-CM | POA: Diagnosis not present

## 2016-12-25 DIAGNOSIS — L97511 Non-pressure chronic ulcer of other part of right foot limited to breakdown of skin: Secondary | ICD-10-CM | POA: Diagnosis not present

## 2016-12-25 DIAGNOSIS — E1142 Type 2 diabetes mellitus with diabetic polyneuropathy: Secondary | ICD-10-CM

## 2016-12-25 NOTE — Progress Notes (Signed)
Office Visit Note   Patient: [name]           Date of Birth: 19-Jul-1960           MRN: [id number] Visit Date: 12/25/2016              Requested by: Fleet Contras, MD [address] [place], [place] [address] PCP: Fleet Contras, MD  Chief Complaint  Patient presents with  . Right Foot - Follow-up  . Left Foot - Follow-up      HPI: Patient presents for evaluation for 2 issues #1 ulcerations right foot for the 2 remaining toes third and fourth toes right foot with chronic ulcer left heel. Patient states he's been using a kneeling scooter occasionally has been doing better keeping the pressure off the heel. He complains of swelling and ulceration right foot third and fourth toes with increased blistering and pain with weightbearing.  Assessment & Plan: Visit Diagnoses:  1. Ulcer of left heel, limited to breakdown of skin (HCC)   2. Diabetic polyneuropathy associated with type 2 diabetes mellitus (HCC)   3. Toe ulcer, right, limited to breakdown of skin (HCC)     Plan: Patient requests to proceed with amputation of the third and fourth toes right foot we will set this up at his convenience in about 2 weeks. Also debrided left heel that shows remarkable improvement with the patient being on a kneeling scooter we'll have him continue with a kneeling scooter.  Follow-Up Instructions: Return in about 1 week (around 01/01/2017).   Ortho Exam  Patient is alert, oriented, no adenopathy, well-dressed, normal affect, normal respiratory effort. Examination patient has an antalgic gait. He has much improvement to the decubitus left heel ulcer. After informed consent a 10 blade knife was used to debride the skin and soft tissue back to healthy viable tissue. The ulcerative areas approximately 2 cm in diameter 1 mm deep. This shows excellent improvement from his last exam. There is no drainage no odor no cellulitis no signs of infection. Examination the right foot he has 2 remaining toes  the third and fourth toe he has fixed clawing of these toes with ulcerations over the tip of the toes from the fixed clawing. There is no sausage digit swelling no signs of abscess no drainage. The ulcers are superficial at this time.  Imaging: No results found.  Labs: Lab Results  Component Value Date   HGBA1C 7.1 (H) 09/24/2016   HGBA1C 6.8 (H) 06/17/2014   HGBA1C (H) 11/26/2006    10.4 (NOTE)   The ADA recommends the following therapeutic goals for glycemic   control related to Hgb A1C measurement:   Goal of Therapy:   < 7.0% Hgb A1C   Action Suggested:  > 8.0% Hgb A1C   Ref:  Diabetes Care, 22, Suppl. 1, 1999   ESRSEDRATE 45 (H) 06/17/2014   CRP 1.5 (H) 06/17/2014   REPTSTATUS 06/19/2014 FINAL 06/17/2014   GRAMSTAIN  06/17/2014    RARE WBC PRESENT, PREDOMINANTLY PMN NO SQUAMOUS EPITHELIAL CELLS SEEN NO ORGANISMS SEEN Performed at [facility]    CULT  06/17/2014    MULTIPLE ORGANISMS PRESENT, NONE PREDOMINANT Note: NO STAPHYLOCOCCUS AUREUS ISOLATED NO GROUP A STREP (S.PYOGENES) ISOLATED Performed at [facility]    LABORGA STAPHYLOCOCCUS AUREUS 11/16/2007   LABORGA MORGANELLA MORGANII 11/16/2007    Orders:  No orders of the defined types were placed in this encounter.  No orders of the defined types were placed in this encounter.  Procedures: No procedures performed  Clinical Data: No additional findings.  ROS:  All other systems negative, except as noted in the HPI. Review of Systems  Objective: Vital Signs: Ht 5\' 9"  (1.753 m)   Wt 265 lb (120.2 kg)   BMI 39.13 kg/m   Specialty Comments:  No specialty comments available.  PMFS History: Patient Active Problem List   Diagnosis Date Noted  . Acute osteomyelitis of toe, right (HCC)   . Diabetic polyneuropathy associated with type 2 diabetes mellitus (HCC) 08/14/2016  . 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 Ulcer of left  heel, limited to breakdown of skin (HCC) 05/15/2016  . Amputated toe of right foot (HCC) 07/27/2015  . Great toe pain 06/17/2014  . Type II diabetes mellitus with peripheral circulatory disorder (HCC) 06/17/2014  . Elevated transaminase level 06/17/2014  . HTN (hypertension) 06/17/2014  . Visual disturbance    Past Medical History:  Diagnosis Date  . Blood transfusion 2009  . Diabetes mellitus    type 2  . Hyperlipemia   . Hypertension    denies  . Leg swelling   . Osteomyelitis (HCC)    right 5th toe  . Visual disturbance     Family History  Problem Relation Age of Onset  . Cancer Mother        breast    Past Surgical History:  Procedure Laterality Date  . AMPUTATION Left 2012   Left Great Toe  . AMPUTATION Right 04/20/2015   Procedure: Right Great Toe Amputation;  Surgeon: Nadara MustardMarcus Fabian Walder V, MD;  [facility];  Service: Orthopedics;  Laterality: Right;  . AMPUTATION Right 07/27/2015   Procedure: Right 2nd Toe Amputation;  Surgeon: Nadara MustardMarcus Corrine Tillis V, MD;  [facility];  Service: Orthopedics;  Laterality: Right;  . AMPUTATION Right 09/24/2016   Procedure: Right 5th Toe Amputation;  Surgeon: Nadara MustardMarcus Laykin Rainone V, MD;  [facility];  Service: Orthopedics;  Laterality: Right;  . CHOLECYSTECTOMY  03/2011  . EYE SURGERY     lazer  . foot surgery  11/2007   small toe on left foot removed.  Marland Kitchen. NASAL SEPTUM SURGERY  1981   Due to broken nose. Surgery was to repair how bone healed.  Marland Kitchen. PARS PLANA VITRECTOMY  11/17/2011   Procedure: PARS PLANA VITRECTOMY WITH 23 GAUGE;  Surgeon: Shade FloodGreer Geiger, MD;  [facility];  Service: Ophthalmology;  Laterality: Right;  With Endolaser  . TONSILLECTOMY     Social History   Occupational History  . Not on file.   Social History Main Topics  . Smoking status: Former Smoker    Types: Cigarettes    Quit date: 03/25/1979  . Smokeless tobacco: Never Used  . Alcohol use Yes     Comment: occasional  6 pk of malt liqour a month  . Drug use: No  . Sexual  activity: Not on file

## 2017-01-05 ENCOUNTER — Other Ambulatory Visit (INDEPENDENT_AMBULATORY_CARE_PROVIDER_SITE_OTHER): Payer: Self-pay | Admitting: Family

## 2017-01-05 ENCOUNTER — Encounter (HOSPITAL_COMMUNITY): Payer: Self-pay | Admitting: *Deleted

## 2017-01-05 NOTE — Progress Notes (Signed)
   01/05/17 1249  OBSTRUCTIVE SLEEP APNEA  Have you ever been diagnosed with sleep apnea through a sleep study? No  Do you snore loudly (loud enough to be heard through closed doors)?  1  Do you often feel tired, fatigued, or sleepy during the daytime (such as falling asleep during driving or talking to someone)? 1  Has anyone observed you stop breathing during your sleep? 1  Do you have, or are you being treated for high blood pressure? 0  BMI more than 35 kg/m2? 0  Age > 50 (1-yes) 1  Neck circumference greater than:Male 16 inches or larger, Male 17inches or larger? 1  Male Gender (Yes=1) 1  Obstructive Sleep Apnea Score 6  Score 5 or greater  Results sent to PCP

## 2017-01-05 NOTE — Progress Notes (Signed)
[name] denies chest pain or shortness of breath. [name] reports that  CBG runs between 100 - 128. I instructed patient to take 1/2 of Lantus dose on Tues evening. I instructed patient to check CBG after awaking and every 2 hours until arrival  to the hospital.  I Instructed patient if CBG is less than 70 to take 4 Glucose Tablets or 1 tube of Glucose Gel or 1/2 cup of a clear juice. Recheck CBG in 15 minutes then call pre- op desk at [phone] for further instructions.

## 2017-01-06 MED ORDER — DEXTROSE 5 % IV SOLN
3.0000 g | INTRAVENOUS | Status: AC
  Administered 2017-01-07: 3 g via INTRAVENOUS
  Filled 2017-01-06: qty 3000

## 2017-01-07 ENCOUNTER — Ambulatory Visit (HOSPITAL_COMMUNITY)
Admission: RE | Admit: 2017-01-07 | Discharge: 2017-01-07 | Disposition: A | Discharge status: Home / Self Care | Payer: Medicaid Other | Source: Ambulatory Visit | Attending: Orthopedic Surgery | Admitting: Orthopedic Surgery

## 2017-01-07 ENCOUNTER — Encounter (HOSPITAL_COMMUNITY)
Admission: RE | Disposition: A | Discharge status: Home / Self Care | Payer: Self-pay | Source: Ambulatory Visit | Attending: Orthopedic Surgery

## 2017-01-07 ENCOUNTER — Ambulatory Visit (HOSPITAL_COMMUNITY): Payer: Medicaid Other | Admitting: Certified Registered"

## 2017-01-07 ENCOUNTER — Encounter (HOSPITAL_COMMUNITY): Payer: Self-pay | Admitting: Certified Registered"

## 2017-01-07 DIAGNOSIS — E11621 Type 2 diabetes mellitus with foot ulcer: Secondary | ICD-10-CM | POA: Insufficient documentation

## 2017-01-07 DIAGNOSIS — L97519 Non-pressure chronic ulcer of other part of right foot with unspecified severity: Secondary | ICD-10-CM | POA: Diagnosis not present

## 2017-01-07 DIAGNOSIS — E1169 Type 2 diabetes mellitus with other specified complication: Secondary | ICD-10-CM | POA: Insufficient documentation

## 2017-01-07 DIAGNOSIS — Z89422 Acquired absence of other left toe(s): Secondary | ICD-10-CM | POA: Diagnosis not present

## 2017-01-07 DIAGNOSIS — Z89421 Acquired absence of other right toe(s): Secondary | ICD-10-CM | POA: Insufficient documentation

## 2017-01-07 DIAGNOSIS — E1140 Type 2 diabetes mellitus with diabetic neuropathy, unspecified: Secondary | ICD-10-CM | POA: Insufficient documentation

## 2017-01-07 DIAGNOSIS — Z87891 Personal history of nicotine dependence: Secondary | ICD-10-CM | POA: Insufficient documentation

## 2017-01-07 DIAGNOSIS — I10 Essential (primary) hypertension: Secondary | ICD-10-CM | POA: Diagnosis not present

## 2017-01-07 DIAGNOSIS — Z89411 Acquired absence of right great toe: Secondary | ICD-10-CM | POA: Diagnosis not present

## 2017-01-07 DIAGNOSIS — E1151 Type 2 diabetes mellitus with diabetic peripheral angiopathy without gangrene: Secondary | ICD-10-CM | POA: Insufficient documentation

## 2017-01-07 DIAGNOSIS — M86171 Other acute osteomyelitis, right ankle and foot: Secondary | ICD-10-CM

## 2017-01-07 DIAGNOSIS — M869 Osteomyelitis, unspecified: Secondary | ICD-10-CM | POA: Insufficient documentation

## 2017-01-07 HISTORY — PX: AMPUTATION: SHX166

## 2017-01-07 LAB — GLUCOSE, CAPILLARY
GLUCOSE-CAPILLARY: 110 mg/dL — AB (ref 65–99)
GLUCOSE-CAPILLARY: 89 mg/dL (ref 65–99)
Glucose-Capillary: 106 mg/dL — ABNORMAL HIGH (ref 65–99)

## 2017-01-07 LAB — BASIC METABOLIC PANEL
Anion gap: 7 (ref 5–15)
BUN: 25 mg/dL — AB (ref 6–20)
CALCIUM: 9 mg/dL (ref 8.9–10.3)
CHLORIDE: 107 mmol/L (ref 101–111)
CO2: 24 mmol/L (ref 22–32)
CREATININE: 1.2 mg/dL (ref 0.61–1.24)
GFR calc non Af Amer: >60 mL/min (ref >60)
Glucose, Bld: 100 mg/dL — ABNORMAL HIGH (ref 65–99)
Potassium: 4.2 mmol/L (ref 3.5–5.1)
SODIUM: 138 mmol/L (ref 135–145)

## 2017-01-07 LAB — CBC
HCT: 40.6 % (ref 39.0–52.0)
HEMOGLOBIN: 12.9 g/dL — AB (ref 13.0–17.0)
MCH: 25.4 pg — AB (ref 26.0–34.0)
MCHC: 31.8 g/dL (ref 30.0–36.0)
MCV: 80.1 fL (ref 78.0–100.0)
Platelets: 231 10*3/uL (ref 150–400)
RBC: 5.07 MIL/uL (ref 4.22–5.81)
RDW: 14.2 % (ref 11.5–15.5)
WBC: 9.9 10*3/uL (ref 4.0–10.5)

## 2017-01-07 SURGERY — AMPUTATION DIGIT
Anesthesia: Monitor Anesthesia Care | Site: Foot | Laterality: Right

## 2017-01-07 MED ORDER — PROPOFOL 500 MG/50ML IV EMUL
INTRAVENOUS | Status: DC | PRN
  Administered 2017-01-07: 100 ug/kg/min via INTRAVENOUS

## 2017-01-07 MED ORDER — MIDAZOLAM HCL 2 MG/2ML IJ SOLN
INTRAMUSCULAR | Status: AC
  Administered 2017-01-07: 2 mg
  Filled 2017-01-07: qty 2

## 2017-01-07 MED ORDER — FENTANYL CITRATE (PF) 100 MCG/2ML IJ SOLN: 25.0000 ug | INTRAMUSCULAR | Status: DC | PRN

## 2017-01-07 MED ORDER — LACTATED RINGERS IV SOLN
INTRAVENOUS | Status: DC
  Administered 2017-01-07: 09:00:00 via INTRAVENOUS

## 2017-01-07 MED ORDER — 0.9 % SODIUM CHLORIDE (POUR BTL) OPTIME
TOPICAL | Status: DC | PRN
  Administered 2017-01-07: 1000 mL

## 2017-01-07 MED ORDER — CHLORHEXIDINE GLUCONATE 4 % EX LIQD: 60.0000 mL | Freq: Once | CUTANEOUS | Status: DC

## 2017-01-07 MED ORDER — PROPOFOL 10 MG/ML IV BOLUS
INTRAVENOUS | Status: AC
  Filled 2017-01-07: qty 20

## 2017-01-07 MED ORDER — MIDAZOLAM HCL 2 MG/2ML IJ SOLN
INTRAMUSCULAR | Status: AC
  Filled 2017-01-07: qty 2

## 2017-01-07 MED ORDER — PROPOFOL 1000 MG/100ML IV EMUL
INTRAVENOUS | Status: AC
  Filled 2017-01-07: qty 100

## 2017-01-07 MED ORDER — FENTANYL CITRATE (PF) 100 MCG/2ML IJ SOLN
INTRAMUSCULAR | Status: AC
  Administered 2017-01-07: 100 ug
  Filled 2017-01-07: qty 2

## 2017-01-07 MED ORDER — PROPOFOL 10 MG/ML IV BOLUS
INTRAVENOUS | Status: DC | PRN
  Administered 2017-01-07 (×2): 20 mg via INTRAVENOUS

## 2017-01-07 MED ORDER — FENTANYL CITRATE (PF) 250 MCG/5ML IJ SOLN
INTRAMUSCULAR | Status: AC
  Filled 2017-01-07: qty 5

## 2017-01-07 SURGICAL SUPPLY — 29 items
BLADE LONG MED 31X9 (MISCELLANEOUS) ×2 IMPLANT
BLADE SURG 21 STRL SS (BLADE) ×2 IMPLANT
BNDG COHESIVE 4X5 TAN STRL (GAUZE/BANDAGES/DRESSINGS) ×2 IMPLANT
BNDG ESMARK 4X9 LF (GAUZE/BANDAGES/DRESSINGS) ×2 IMPLANT
BNDG GAUZE ELAST 4 BULKY (GAUZE/BANDAGES/DRESSINGS) ×2 IMPLANT
COVER SURGICAL LIGHT HANDLE (MISCELLANEOUS) ×4 IMPLANT
DRAPE U-SHAPE 47X51 STRL (DRAPES) ×2 IMPLANT
DRSG ADAPTIC 3X8 NADH LF (GAUZE/BANDAGES/DRESSINGS) ×2 IMPLANT
DRSG PAD ABDOMINAL 8X10 ST (GAUZE/BANDAGES/DRESSINGS) ×2 IMPLANT
DURAPREP 26ML APPLICATOR (WOUND CARE) ×2 IMPLANT
ELECT REM PT RETURN 9FT ADLT (ELECTROSURGICAL) ×2
ELECTRODE REM PT RTRN 9FT ADLT (ELECTROSURGICAL) ×1 IMPLANT
GAUZE SPONGE 4X4 12PLY STRL (GAUZE/BANDAGES/DRESSINGS) ×2 IMPLANT
GLOVE BIOGEL PI IND STRL 9 (GLOVE) ×1 IMPLANT
GLOVE BIOGEL PI INDICATOR 9 (GLOVE) ×1
GLOVE SURG ORTHO 9.0 STRL STRW (GLOVE) ×2 IMPLANT
GOWN STRL REUS W/ TWL XL LVL3 (GOWN DISPOSABLE) ×2 IMPLANT
GOWN STRL REUS W/TWL XL LVL3 (GOWN DISPOSABLE) ×2
KIT BASIN OR (CUSTOM PROCEDURE TRAY) ×2 IMPLANT
KIT ROOM TURNOVER OR (KITS) ×2 IMPLANT
MANIFOLD NEPTUNE II (INSTRUMENTS) ×2 IMPLANT
NEEDLE 22X1 1/2 (OR ONLY) (NEEDLE) IMPLANT
NS IRRIG 1000ML POUR BTL (IV SOLUTION) ×2 IMPLANT
PACK ORTHO EXTREMITY (CUSTOM PROCEDURE TRAY) ×2 IMPLANT
PAD ABD 8X10 STRL (GAUZE/BANDAGES/DRESSINGS) ×2 IMPLANT
PAD ARMBOARD 7.5X6 YLW CONV (MISCELLANEOUS) ×4 IMPLANT
SUT ETHILON 2 0 PSLX (SUTURE) ×2 IMPLANT
SYR CONTROL 10ML LL (SYRINGE) IMPLANT
TOWEL OR 17X26 10 PK STRL BLUE (TOWEL DISPOSABLE) ×2 IMPLANT

## 2017-01-07 NOTE — H&P (Signed)
Aldona BarSamuel Sahr is an 56 y.o. male.   Chief Complaint: Osteomyelitis ulceration right foot third and fourth toes. 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  Past Medical History:  Diagnosis Date  . Blood transfusion 2009  . Diabetes mellitus    type 2  . Hyperlipemia   . Hypertension    denies  . Leg swelling   . Osteomyelitis (HCC)    right 5th toe  . Visual disturbance     Past Surgical History:  Procedure Laterality Date  . AMPUTATION Left 2012   Left Great Toe  . AMPUTATION Right 04/20/2015   Procedure: Right Great Toe Amputation;  Surgeon: Nadara MustardMarcus Quinlan Vollmer V, MD;  [facility];  Service: Orthopedics;  Laterality: Right;  . AMPUTATION Right 07/27/2015   Procedure: Right 2nd Toe Amputation;  Surgeon: Nadara MustardMarcus Taisia Fantini V, MD;  [facility];  Service: Orthopedics;  Laterality: Right;  . AMPUTATION Right 09/24/2016   Procedure: Right 5th Toe Amputation;  Surgeon: Nadara MustardMarcus Isyss Espinal V, MD;  [facility];  Service: Orthopedics;  Laterality: Right;  . CHOLECYSTECTOMY  03/2011  . EYE SURGERY     lazer  . foot surgery  11/2007   small toe on left foot removed.  Marland Kitchen. NASAL SEPTUM SURGERY  1981   Due to broken nose. Surgery was to repair how bone healed.  Marland Kitchen. PARS PLANA VITRECTOMY  11/17/2011   Procedure: PARS PLANA VITRECTOMY WITH 23 GAUGE;  Surgeon: Shade FloodGreer Geiger, MD;  [facility];  Service: Ophthalmology;  Laterality: Right;  With Endolaser  . TOE AMPUTATION Left    5 th  . TONSILLECTOMY      Family History  Problem Relation Age of Onset  . Cancer Mother        breast   Social History:  reports that he quit smoking about 37 years ago. His smoking use included Cigarettes. He quit after 5.00 years of use. He has never used smokeless tobacco. He reports that he drinks alcohol. He reports that he does not use drugs.  Allergies:  Allergies  Allergen  Reactions  . No Known Allergies     No prescriptions prior to admission.    No results found for this or any previous visit (from the past 48 hour(s)). No results found.  Review of Systems  All other systems reviewed and are negative.   There were no vitals taken for this visit. Physical Exam  On examination patient has a palpable dorsalis pedis pulse he has plantar ulcers as well as ulceration with osteomyelitis right foot third and fourth toes. Assessment/Plan Assessment: Diabetic insensate neuropathy with ulceration osteomyelitis right foot third and fourth toes.  Plan: Plan for amputation of the third and fourth toes. Risk and benefits were discussed including risk of the wound not healing need for additional surgery. Patient states he understands wishes to proceed this time.  Nadara MustardMarcus V Mckenley Birenbaum, MD 01/07/2017, 6:29 AM

## 2017-01-07 NOTE — Anesthesia Procedure Notes (Signed)
Procedure Name: MAC Date/Time: 01/07/2017 11:52 AM Performed by: Melina Copa, Oliana Gowens R Pre-anesthesia Checklist: Patient identified, Emergency Drugs available, Suction available, Patient being monitored and Timeout performed Patient Re-evaluated:Patient Re-evaluated prior to induction Oxygen Delivery Method: Nasal cannula Placement Confirmation: positive ETCO2 Dental Injury: Teeth and Oropharynx as per pre-operative assessment

## 2017-01-07 NOTE — Transfer of Care (Signed)
Immediate Anesthesia Transfer of Care Note  Patient: [name]  Procedure(s) Performed: Procedure(s): Right Foot 3rd and 4th Toe Amputation (Right)  Patient Location: PACU  Anesthesia Type:MAC combined with regional for post-op pain  Level of Consciousness: awake, oriented and patient cooperative  Airway & Oxygen Therapy: Patient Spontanous Breathing and Patient connected to nasal cannula oxygen  Post-op Assessment: Report given to RN, Post -op Vital signs reviewed and stable and Patient moving all extremities  Post vital signs: Reviewed and stable  Last Vitals:  Vitals:   01/07/17 0907 01/07/17 1227  BP: 123/70 126/62  Pulse: 88 68  Resp: 18 20  Temp: (!) 36.4 C 36.6 C    Last Pain:  Vitals:   01/07/17 0907  TempSrc: Oral      Patients Stated Pain Goal: 3 (40/10/27 2536)  Complications: No apparent anesthesia complications

## 2017-01-07 NOTE — Anesthesia Procedure Notes (Signed)
Anesthesia Regional Block: Ankle block   Pre-Anesthetic Checklist: ,, timeout performed, Correct Patient, Correct Site, Correct Laterality, Correct Procedure, Correct Position, site marked, Risks and benefits discussed,  Surgical consent,  Pre-op evaluation,  At surgeon's request and post-op pain management  Laterality: Right  Prep: chloraprep       Needles:       Needle Gauge: 25     Additional Needles:   Narrative:  Start time: 01/07/2017 11:20 AM End time: 01/07/2017 11:40 AM Injection made incrementally with aspirations every 5 mL.  Performed by: Personally  Anesthesiologist: Dan SinghGREEN, [name]

## 2017-01-07 NOTE — Op Note (Signed)
01/07/2017  12:16 PM  PATIENT:  Aldona BarSamuel Eisenhour    PRE-OPERATIVE DIAGNOSIS:  Ulcers Right Foot, with osteomyelitis third and fourth toe  POST-OPERATIVE DIAGNOSIS:  Same  PROCEDURE:  Right Foot 3rd and 4th Toe Amputation  SURGEON:  Nadara MustardMarcus V Duda, MD  PHYSICIAN ASSISTANT:None ANESTHESIA:   General  PREOPERATIVE INDICATIONS:  Aldona BarSamuel Bachtell is a  56 y.o. male with a diagnosis of Ulcers Right Foot who failed conservative measures and elected for surgical management.    The risks benefits and alternatives were discussed with the patient preoperatively including but not limited to the risks of infection, bleeding, nerve injury, cardiopulmonary complications, the need for revision surgery, among others, and the patient was willing to proceed.  OPERATIVE IMPLANTS: none  OPERATIVE FINDINGS: Good petechial bleeding no abscess at the MTP joint  OPERATIVE PROCEDURE: Patient was brought to the operating room and underwent a regional anesthetic. After adequate levels anesthesia obtained patient's right lower extremity was prepped using DuraPrep draped into a sterile field a timeout was called. Elliptical incision was made at the base of the third and fourth toe and the third and fourth toe were amputated through the MTP joint. Electrocautery was used for hemostasis the wound was irrigated with normal saline and the incision was closed using 2-0 nylon a sterile compressive dressing was applied patient was taken to the PACU in stable condition.

## 2017-01-07 NOTE — Anesthesia Postprocedure Evaluation (Signed)
Anesthesia Post Note  Patient: [name]  Procedure(s) Performed: Procedure(s) (LRB): Right Foot 3rd and 4th Toe Amputation (Right)     Patient location during evaluation: PACU Anesthesia Type: Regional and MAC Level of consciousness: awake Pain management: pain level controlled Vital Signs Assessment: post-procedure vital signs reviewed and stable Respiratory status: spontaneous breathing Cardiovascular status: stable Postop Assessment: no signs of nausea or vomiting Anesthetic complications: no    Last Vitals:  Vitals:   01/07/17 1227 01/07/17 1242  BP: 126/62 118/63  Pulse: 68 68  Resp: 20 12  Temp: 36.6 C     Last Pain:  Vitals:   01/07/17 1227  TempSrc:   PainSc: 0-No pain                 Taura Lamarre

## 2017-01-07 NOTE — Anesthesia Preprocedure Evaluation (Addendum)
Anesthesia Evaluation  Patient identified by MRN, date of birth, ID band Patient awake    Reviewed: Allergy & Precautions, NPO status , Patient's Chart, lab work & pertinent test results  Airway Mallampati: II  TM Distance: >3 FB     Dental   Pulmonary former smoker,    breath sounds clear to auscultation       Cardiovascular hypertension, + Peripheral Vascular Disease   Rhythm:Regular Rate:Normal     Neuro/Psych  Neuromuscular disease    GI/Hepatic negative GI ROS, Neg liver ROS,   Endo/Other  diabetes  Renal/GU negative Renal ROS     Musculoskeletal   Abdominal   Peds  Hematology   Anesthesia Other Findings   Reproductive/Obstetrics                             Anesthesia Physical Anesthesia Plan  ASA: III  Anesthesia Plan: MAC and Regional   Post-op Pain Management:  Regional for Post-op pain   Induction: Intravenous  PONV Risk Score and Plan: 2 and Ondansetron, Dexamethasone, Propofol, Midazolam and Treatment may vary due to age or medical condition  Airway Management Planned: Mask  Additional Equipment:   Intra-op Plan:   Post-operative Plan:   Informed Consent: I have reviewed the patients History and Physical, chart, labs and discussed the procedure including the risks, benefits and alternatives for the proposed anesthesia with the patient or authorized representative who has indicated his/her understanding and acceptance.   Dental advisory given  Plan Discussed with: Anesthesiologist and CRNA  Anesthesia Plan Comments:         Anesthesia Quick Evaluation

## 2017-01-08 ENCOUNTER — Encounter (HOSPITAL_COMMUNITY): Payer: Self-pay | Admitting: Orthopedic Surgery

## 2017-01-15 ENCOUNTER — Encounter (INDEPENDENT_AMBULATORY_CARE_PROVIDER_SITE_OTHER): Payer: Self-pay | Admitting: Family

## 2017-01-15 ENCOUNTER — Ambulatory Visit (INDEPENDENT_AMBULATORY_CARE_PROVIDER_SITE_OTHER): Payer: Medicaid Other | Admitting: Family

## 2017-01-15 DIAGNOSIS — E1142 Type 2 diabetes mellitus with diabetic polyneuropathy: Secondary | ICD-10-CM

## 2017-01-15 DIAGNOSIS — L97522 Non-pressure chronic ulcer of other part of left foot with fat layer exposed: Secondary | ICD-10-CM

## 2017-01-15 DIAGNOSIS — S98131A Complete traumatic amputation of one right lesser toe, initial encounter: Secondary | ICD-10-CM

## 2017-01-15 DIAGNOSIS — Z89421 Acquired absence of other right toe(s): Secondary | ICD-10-CM

## 2017-01-15 DIAGNOSIS — M86171 Other acute osteomyelitis, right ankle and foot: Secondary | ICD-10-CM

## 2017-01-15 NOTE — Progress Notes (Signed)
Office Visit Note   Patient: [name]           Date of Birth: 1960/11/19           MRN: [id number] Visit Date: 01/15/2017              Requested by: Fleet ContrasAvbuere, Edwin, MD [address] [place], [place] [id number] PCP: Fleet ContrasAvbuere, Edwin, MD  Chief Complaint  Patient presents with  . Right Foot - Routine Post Op    01/07/17 Right Foot 3rd and 4th toe amputation 8 days post op  . Left Foot - Wound Check      HPI: The patient is a 56 year old gentleman who presents today one week status post right foot third and fourth toe amputations. Has now had all toes amputated from the right foot. This appears to be healing well he has no concerns for the right foot. He does have a plantar ulceration on the left foot concerned for worsening. Complaining of some soreness to the plantar aspect of his foot has been in a postop shoe on both feet.  Assessment & Plan: Visit Diagnoses:  1. Acute osteomyelitis of toe, right (HCC)   2. Diabetic polyneuropathy associated with type 2 diabetes mellitus (HCC)   3. Amputated toe of right foot (HCC)     Plan: Encouraged patient to do mupirocin dressing changes on the left. He'll do dry dressing changes on the right. Has compression stockings bilaterally. Follow-up in office in 1 week for suture removal on the right.  Follow-Up Instructions: Return in about 1 week (around 01/22/2017).   Ortho Exam  Patient is alert, oriented, no adenopathy, well-dressed, normal affect, normal respiratory effort. Examination of the right foot the incision is well approximated with sutures there is no drainage no erythema no odor no sign of infection. Does have moderate swelling to the lower extremity and foot. Examination of the left foot has a heel ulcer surrounding nonviable tissue was pared with a 10 blade knife back to viable tissue. There is now ulceration that is 6 cm x 2 cm in size is 1 mm deep there is beefy tissue in the wound bed there is scant exudative tissue there  is no purulence no odor no erythema sign of infection.  Imaging: No results found.  Labs: Lab Results  Component Value Date   HGBA1C 7.1 (H) 09/24/2016   HGBA1C 6.8 (H) 06/17/2014   HGBA1C (H) 11/26/2006    10.4 (NOTE)   The ADA recommends the following therapeutic goals for glycemic   control related to Hgb A1C measurement:   Goal of Therapy:   < 7.0% Hgb A1C   Action Suggested:  > 8.0% Hgb A1C   Ref:  Diabetes Care, 22, Suppl. 1, 1999   ESRSEDRATE 45 (H) 06/17/2014   CRP 1.5 (H) 06/17/2014   REPTSTATUS 06/19/2014 FINAL 06/17/2014   GRAMSTAIN  06/17/2014    RARE WBC PRESENT, PREDOMINANTLY PMN NO SQUAMOUS EPITHELIAL CELLS SEEN NO ORGANISMS SEEN Performed at [facility]    CULT  06/17/2014    MULTIPLE ORGANISMS PRESENT, NONE PREDOMINANT Note: NO STAPHYLOCOCCUS AUREUS ISOLATED NO GROUP A STREP (S.PYOGENES) ISOLATED Performed at [facility]    LABORGA STAPHYLOCOCCUS AUREUS 11/16/2007   LABORGA MORGANELLA MORGANII 11/16/2007    Orders:  No orders of the defined types were placed in this encounter.  No orders of the defined types were placed in this encounter.    Procedures: No procedures performed  Clinical Data: No additional findings.  ROS:  All other systems negative, except as noted in the HPI. Review of Systems  Constitutional: Negative for chills and fever.  Cardiovascular: Positive for leg swelling.  Skin: Positive for wound. Negative for color change.    Objective: Vital Signs: There were no vitals taken for this visit.  Specialty Comments:  No specialty comments available.  PMFS History: Patient Active Problem List   Diagnosis Date Noted  . Acute osteomyelitis of toe, right (HCC)   . Diabetic polyneuropathy associated with type 2 diabetes mellitus (HCC) 08/14/2016  . 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 Ulcer of left heel, limited to breakdown of skin (HCC)  05/15/2016  . Amputated toe of right foot (HCC) 07/27/2015  . Great toe pain 06/17/2014  . Type II diabetes mellitus with peripheral circulatory disorder (HCC) 06/17/2014  . Elevated transaminase level 06/17/2014  . HTN (hypertension) 06/17/2014  . Visual disturbance    Past Medical History:  Diagnosis Date  . Blood transfusion 2009  . Diabetes mellitus    type 2  . Hyperlipemia   . Hypertension    denies  . Leg swelling   . Osteomyelitis (HCC)    right 5th toe  . Visual disturbance     Family History  Problem Relation Age of Onset  . Cancer Mother        breast    Past Surgical History:  Procedure Laterality Date  . AMPUTATION Left 2012   Left Great Toe  . AMPUTATION Right 04/20/2015   Procedure: Right Great Toe Amputation;  Surgeon: Nadara MustardMarcus Duda V, MD;  [facility];  Service: Orthopedics;  Laterality: Right;  . AMPUTATION Right 07/27/2015   Procedure: Right 2nd Toe Amputation;  Surgeon: Nadara MustardMarcus Duda V, MD;  [facility];  Service: Orthopedics;  Laterality: Right;  . AMPUTATION Right 09/24/2016   Procedure: Right 5th Toe Amputation;  Surgeon: Nadara MustardMarcus Duda V, MD;  [facility];  Service: Orthopedics;  Laterality: Right;  . AMPUTATION Right 01/07/2017   Procedure: Right Foot 3rd and 4th Toe Amputation;  Surgeon: Nadara Mustarduda, Marcus V, MD;  [facility];  Service: Orthopedics;  Laterality: Right;  . CHOLECYSTECTOMY  03/2011  . EYE SURGERY     lazer  . foot surgery  11/2007   small toe on left foot removed.  Marland Kitchen. NASAL SEPTUM SURGERY  1981   Due to broken nose. Surgery was to repair how bone healed.  Marland Kitchen. PARS PLANA VITRECTOMY  11/17/2011   Procedure: PARS PLANA VITRECTOMY WITH 23 GAUGE;  Surgeon: Shade FloodGreer Geiger, MD;  [facility];  Service: Ophthalmology;  Laterality: Right;  With Endolaser  . TOE AMPUTATION Left    5 th  . TONSILLECTOMY     Social History   Occupational History  . Not on file.   Social History Main Topics  . Smoking status: Former Smoker    Years: 5.00     Types: Cigarettes    Quit date: 03/25/1979  . Smokeless tobacco: Never Used  . Alcohol use Yes     Comment: occasional  24 pk of malt liqour a month  . Drug use: No  . Sexual activity: Not on file

## 2017-01-22 ENCOUNTER — Ambulatory Visit (INDEPENDENT_AMBULATORY_CARE_PROVIDER_SITE_OTHER): Payer: Medicaid Other | Admitting: Family

## 2017-01-22 ENCOUNTER — Encounter (INDEPENDENT_AMBULATORY_CARE_PROVIDER_SITE_OTHER): Payer: Self-pay | Admitting: Family

## 2017-01-22 DIAGNOSIS — L97421 Non-pressure chronic ulcer of left heel and midfoot limited to breakdown of skin: Secondary | ICD-10-CM

## 2017-01-22 DIAGNOSIS — S98131A Complete traumatic amputation of one right lesser toe, initial encounter: Secondary | ICD-10-CM

## 2017-01-22 DIAGNOSIS — Z89421 Acquired absence of other right toe(s): Secondary | ICD-10-CM

## 2017-01-22 NOTE — Progress Notes (Signed)
Post-Op Visit Note   Patient: [name]           Date of Birth: 02/15/1961           MRN: [id number] Visit Date: 01/22/2017 PCP: Fleet Contras, MD  Chief Complaint:  Chief Complaint  Patient presents with  . Right Foot - Routine Post Op    01/07/17 Right 3rd and 4th toe amputation   . Left Foot - Wound Check    Plantar heel ulceration    HPI:  The patient is a 56 year old gentleman who presents today 2 weeks status post third and fourth toe amputations on the right foot. Last week had massive plantar ulceration to the left foot as well.    Ortho Exam On examination the right third fourth toe amputations are healing well sutures are in place we will harvest these today there is scant serosanguineous drainage there is no erythema and no swelling no odor no sign of infection.  On examination of the left foot the plantar ulcer has healed there is no drainage no open area and no erythema no sign of infection.  Visit Diagnoses:  1. Amputated toe of right foot (HCC)   2. Ulcer of left heel, limited to breakdown of skin (HCC)     Plan: We'll harvest the sutures. He'll advance weightbearing as tolerated. May continue with dry dressings to the incision on the right foot. Follow-up in office in 3 weeks.  Follow-Up Instructions: Return in about 3 weeks (around 02/12/2017).   Imaging: No results found.  Orders:  No orders of the defined types were placed in this encounter.  No orders of the defined types were placed in this encounter.    PMFS History: Patient Active Problem List   Diagnosis Date Noted  . Acute osteomyelitis of toe, right (HCC)   . Diabetic polyneuropathy associated with type 2 diabetes mellitus (HCC) 08/14/2016  . 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 Ulcer of left heel, limited to breakdown of skin (HCC) 05/15/2016  . Amputated toe of right foot (HCC) 07/27/2015  . Great toe pain 06/17/2014   . Type II diabetes mellitus with peripheral circulatory disorder (HCC) 06/17/2014  . Elevated transaminase level 06/17/2014  . HTN (hypertension) 06/17/2014  . Visual disturbance    Past Medical History:  Diagnosis Date  . Blood transfusion 2009  . Diabetes mellitus    type 2  . Hyperlipemia   . Hypertension    denies  . Leg swelling   . Osteomyelitis (HCC)    right 5th toe  . Visual disturbance     Family History  Problem Relation Age of Onset  . Cancer Mother        breast    Past Surgical History:  Procedure Laterality Date  . AMPUTATION Left 2012   Left Great Toe  . AMPUTATION Right 04/20/2015   Procedure: Right Great Toe Amputation;  Surgeon: [name], MD;  Location[facility];  Service: Orthopedics;  Laterality: Right;  . AMPUTATION Right 07/27/2015   Procedure: Right 2nd Toe Amputation;  Surgeon: [name], MD;  Location: [facility] OR;  Service: Orthopedics;  Laterality: Right;  . AMPUTATION Right 09/24/2016   Procedure: Right 5th Toe Amputation;  Surgeon: [name], MD;  [facility];  Service: Orthopedics;  Laterality: Right;  . AMPUTATION Right 01/07/2017   Procedure: Right Foot 3rd and 4th Toe Amputation;  Surgeon: [name], MD;  [facility];  Service: Orthopedics;  Laterality: Right;  . CHOLECYSTECTOMY  03/2011  . EYE SURGERY     lazer  . foot surgery  11/2007   small toe on left foot removed.  Marland Kitchen. NASAL SEPTUM SURGERY  1981   Due to broken nose. Surgery was to repair how bone healed.  Marland Kitchen. PARS PLANA VITRECTOMY  11/17/2011   Procedure: PARS PLANA VITRECTOMY WITH 23 GAUGE;  Surgeon: Shade FloodGreer Geiger, MD;  [facility];  Service: Ophthalmology;  Laterality: Right;  With Endolaser  . TOE AMPUTATION Left    5 th  . TONSILLECTOMY     Social History   Occupational History  . Not on file.   Social History Main Topics  . Smoking status: Former Smoker    Years: 5.00    Types: Cigarettes    Quit date: 03/25/1979  . Smokeless tobacco: Never Used  .  Alcohol use Yes     Comment: occasional  24 pk of malt liqour a month  . Drug use: No  . Sexual activity: Not on file

## 2017-02-12 ENCOUNTER — Encounter (INDEPENDENT_AMBULATORY_CARE_PROVIDER_SITE_OTHER): Payer: Self-pay | Admitting: Orthopedic Surgery

## 2017-02-12 ENCOUNTER — Ambulatory Visit (INDEPENDENT_AMBULATORY_CARE_PROVIDER_SITE_OTHER): Payer: Medicaid Other | Admitting: Orthopedic Surgery

## 2017-02-12 DIAGNOSIS — S98131A Complete traumatic amputation of one right lesser toe, initial encounter: Secondary | ICD-10-CM

## 2017-02-12 DIAGNOSIS — Z89421 Acquired absence of other right toe(s): Secondary | ICD-10-CM

## 2017-02-12 DIAGNOSIS — L97421 Non-pressure chronic ulcer of left heel and midfoot limited to breakdown of skin: Secondary | ICD-10-CM

## 2017-02-12 MED ORDER — MUPIROCIN 2 % EX OINT: 1.0000 "application " | TOPICAL_OINTMENT | Freq: Every day | CUTANEOUS | 6 refills | Status: DC

## 2017-02-12 NOTE — Progress Notes (Signed)
Post-Op Visit Note   Patient: [name]           Date of Birth: 29-Jul-1960           MRN: [id number] Visit Date: 02/12/2017 PCP: Fleet Contras, MD  Chief Complaint:  Chief Complaint  Patient presents with  . Left Foot - Follow-up  . Right Foot - Follow-up    01/07/17 3rd and 4th toe amputation    HPI:  The patient is a 56 year old gentleman who presents today 4 weeks status post third and fourth toe amputations on the right foot. States has new "soft area" beneath the left heel with callus.    Ortho Exam On examination the right third fourth toe amputations are well healed. No drainage there is no erythema and no swelling no odor no sign of infection.  On examination of the left foot the plantar callus debrided has ulcerative area that is 2 cm x 5 mm, is 3 mm deep. Granulation in bed. No purulence or odor. No erythema.    Visit Diagnoses:  No diagnosis found.  Plan: resume kneeling scooter for left lower extremity until ulcer healed. May discontinue dressings to right foot. Mupirocin dressing changes to left foot, plantar aspect. Follow-up in office in 3 weeks.  Follow-Up Instructions: No Follow-up on file.   Imaging: No results found.  Orders:  No orders of the defined types were placed in this encounter.  No orders of the defined types were placed in this encounter.    PMFS History: Patient Active Problem List   Diagnosis Date Noted  . Acute osteomyelitis of toe, right (HCC)   . Diabetic polyneuropathy associated with type 2 diabetes mellitus (HCC) 08/14/2016  . 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 Ulcer of left heel, limited to breakdown of skin (HCC) 05/15/2016  . Amputated toe of right foot (HCC) 07/27/2015  . Great toe pain 06/17/2014  . Type II diabetes mellitus with peripheral circulatory disorder (HCC) 06/17/2014  . Elevated transaminase level 06/17/2014  . HTN (hypertension)  06/17/2014  . Visual disturbance    Past Medical History:  Diagnosis Date  . Blood transfusion 2009  . Diabetes mellitus    type 2  . Hyperlipemia   . Hypertension    denies  . Leg swelling   . Osteomyelitis (HCC)    right 5th toe  . Visual disturbance     Family History  Problem Relation Age of Onset  . Cancer Mother        breast    Past Surgical History:  Procedure Laterality Date  . AMPUTATION Left 2012   Left Great Toe  . AMPUTATION Right 04/20/2015   Procedure: Right Great Toe Amputation;  Surgeon: [name], MD;  Location: [facility] OR;  Service: Orthopedics;  Laterality: Right;  . AMPUTATION Right 07/27/2015   Procedure: Right 2nd Toe Amputation;  Surgeon: [name], MD;  Location: [facility] OR;  Service: Orthopedics;  Laterality: Right;  . AMPUTATION Right 09/24/2016   Procedure: Right 5th Toe Amputation;  Surgeon: [name], MD;  Location: [facility] OR;  Service: Orthopedics;  Laterality: Right;  . AMPUTATION Right 01/07/2017   Procedure: Right Foot 3rd and 4th Toe Amputation;  Surgeon: [name], MD;  [facility] OR;  Service: Orthopedics;  Laterality: Right;  . CHOLECYSTECTOMY  03/2011  . EYE SURGERY     lazer  . foot surgery  11/2007   small toe on left foot removed.  Marland Kitchen  NASAL SEPTUM SURGERY  1981   Due to broken nose. Surgery was to repair how bone healed.  Marland Kitchen. PARS PLANA VITRECTOMY  11/17/2011   Procedure: PARS PLANA VITRECTOMY WITH 23 GAUGE;  Surgeon: Shade FloodGreer Geiger, MD;  [facility];  Service: Ophthalmology;  Laterality: Right;  With Endolaser  . TOE AMPUTATION Left    5 th  . TONSILLECTOMY     Social History   Occupational History  . Not on file.   Social History Main Topics  . Smoking status: Former Smoker    Years: 5.00    Types: Cigarettes    Quit date: 03/25/1979  . Smokeless tobacco: Never Used  . Alcohol use Yes     Comment: occasional  24 pk of malt liqour a month  . Drug use: No  . Sexual activity: Not on file

## 2017-03-03 ENCOUNTER — Encounter (INDEPENDENT_AMBULATORY_CARE_PROVIDER_SITE_OTHER): Payer: Self-pay | Admitting: Orthopedic Surgery

## 2017-03-03 ENCOUNTER — Ambulatory Visit (INDEPENDENT_AMBULATORY_CARE_PROVIDER_SITE_OTHER): Payer: Medicaid Other | Admitting: Orthopedic Surgery

## 2017-03-03 DIAGNOSIS — L97421 Non-pressure chronic ulcer of left heel and midfoot limited to breakdown of skin: Secondary | ICD-10-CM

## 2017-03-03 DIAGNOSIS — E1142 Type 2 diabetes mellitus with diabetic polyneuropathy: Secondary | ICD-10-CM | POA: Diagnosis not present

## 2017-03-03 NOTE — Progress Notes (Signed)
Office Visit Note   Patient: [name]           Date of Birth: 03-14-1961           MRN: [id number] Visit Date: 03/03/2017              Requested by: Fleet Contras, MD [address] [place], [place] [address] PCP: Fleet Contras, MD  Chief Complaint  Patient presents with  . Right Foot - Routine Post Op  . toe    toes amputation f/u      HPI: Patient's 56-year-old gentleman presents in follow-up for left heel ulcer patient states she's noticed some increased drainage and soreness. Pain with weightbearing only. Patient states he tries to keep it elevated and has been using Bactroban dressing changes.  Assessment & Plan: Visit Diagnoses:  1. Ulcer of left heel, limited to breakdown of skin (HCC)   2. Diabetic polyneuropathy associated with type 2 diabetes mellitus (HCC)     Plan: Ulcer debridement of skin and soft tissue continue Bactroban dressing changes continue with the extra-depth shoes and custom orthotics minimize weightbearing  Follow-Up Instructions: Return in about 3 weeks (around 03/24/2017).   Ortho Exam  Patient is alert, oriented, no adenopathy, well-dressed, normal affect, normal respiratory effort. Examination patient's both feet are plantigrade. He has no leg ulcers no ulcers in the plantar aspect the right foot status post transmetatarsal amp rotation of the right. The right foot is well-healed with no open ulcers in the calf heel or foot. Examination the left foot he has recurrent ulceration over the plantar aspect of the left heel. After informed consent a 10 blade knife was used to debride the skin and soft tissue back to healthy viable granulation tissue the ulcer is 35 x 20 mm and 2 mm deep. Silver nitrate was used for hemostasis as were 2 x 2 and a Band-Aid was applied. There is no redness no cellulitis no odor no drainage from the left foot. There are no venous ulcers.  Imaging: No results found. No images are attached to the  encounter.  Labs: Lab Results  Component Value Date   HGBA1C 7.1 (H) 09/24/2016   HGBA1C 6.8 (H) 06/17/2014   HGBA1C (H) 11/26/2006    10.4 (NOTE)   The ADA recommends the following therapeutic goals for glycemic   control related to Hgb A1C measurement:   Goal of Therapy:   < 7.0% Hgb A1C   Action Suggested:  > 8.0% Hgb A1C   Ref:  Diabetes Care, 22, Suppl. 1, 1999   ESRSEDRATE 45 (H) 06/17/2014   CRP 1.5 (H) 06/17/2014   REPTSTATUS 06/19/2014 FINAL 06/17/2014   GRAMSTAIN  06/17/2014    RARE WBC PRESENT, PREDOMINANTLY PMN NO SQUAMOUS EPITHELIAL CELLS SEEN NO ORGANISMS SEEN Performed at [facility]    CULT  06/17/2014    MULTIPLE ORGANISMS PRESENT, NONE PREDOMINANT Note: NO STAPHYLOCOCCUS AUREUS ISOLATED NO GROUP A STREP (S.PYOGENES) ISOLATED Performed at [facility]    LABORGA STAPHYLOCOCCUS AUREUS 11/16/2007   LABORGA MORGANELLA MORGANII 11/16/2007    Orders:  No orders of the defined types were placed in this encounter.  No orders of the defined types were placed in this encounter.    Procedures: No procedures performed  Clinical Data: No additional findings.  ROS:  All other systems negative, except as noted in the HPI. Review of Systems  Objective: Vital Signs: There were no vitals taken for this visit.  Specialty Comments:  No specialty comments available.  PMFS History: Patient Active Problem List   Diagnosis Date Noted  . Acute osteomyelitis of toe, right (HCC)   . Diabetic polyneuropathy associated with type 2 diabetes mellitus (HCC) 08/14/2016  . 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 Ulcer of left heel, limited to breakdown of skin (HCC) 05/15/2016  . Amputated toe of right foot (HCC) 07/27/2015  . Great toe pain 06/17/2014  . Type II diabetes mellitus with peripheral circulatory disorder (HCC) 06/17/2014  . Elevated transaminase level 06/17/2014  . HTN  (hypertension) 06/17/2014  . Visual disturbance    Past Medical History:  Diagnosis Date  . Blood transfusion 2009  . Diabetes mellitus    type 2  . Hyperlipemia   . Hypertension    denies  . Leg swelling   . Osteomyelitis (HCC)    right 5th toe  . Visual disturbance     Family History  Problem Relation Age of Onset  . Cancer Mother        breast    Past Surgical History:  Procedure Laterality Date  . AMPUTATION Left 2012   Left Great Toe  . AMPUTATION Right 04/20/2015   Procedure: Right Great Toe Amputation;  Surgeon: [name], MD;  Location: [facility] OR;  Service: Orthopedics;  Laterality: Right;  . AMPUTATION Right 07/27/2015   Procedure: Right 2nd Toe Amputation;  Surgeon: [name], MD;  Location: [facility] OR;  Service: Orthopedics;  Laterality: Right;  . AMPUTATION Right 09/24/2016   Procedure: Right 5th Toe Amputation;  Surgeon: [name], MD;  Location: [facility] OR;  Service: Orthopedics;  Laterality: Right;  . AMPUTATION Right 01/07/2017   Procedure: Right Foot 3rd and 4th Toe Amputation;  Surgeon: [name], MD;  Location: [facility] OR;  Service: Orthopedics;  Laterality: Right;  . CHOLECYSTECTOMY  03/2011  . EYE SURGERY     lazer  . foot surgery  11/2007   small toe on left foot removed.  Marland Kitchen NASAL SEPTUM SURGERY  1981   Due to broken nose. Surgery was to repair how bone healed.  Marland Kitchen PARS PLANA VITRECTOMY  11/17/2011   Procedure: PARS PLANA VITRECTOMY WITH 23 GAUGE;  Surgeon: Shade Flood, MD;  Location: [facility] OR;  Service: Ophthalmology;  Laterality: Right;  With Endolaser  . TOE AMPUTATION Left    5 th  . TONSILLECTOMY     Social History   Occupational History  . Not on file.   Social History Main Topics  . Smoking status: Former Smoker    Years: 5.00    Types: Cigarettes    Quit date: 03/25/1979  . Smokeless tobacco: Never Used  . Alcohol use Yes     Comment: occasional  24 pk of malt liqour a month  . Drug use: No  . Sexual activity: Not on file

## 2017-03-12 ENCOUNTER — Ambulatory Visit (INDEPENDENT_AMBULATORY_CARE_PROVIDER_SITE_OTHER): Payer: Self-pay | Admitting: Orthopedic Surgery

## 2017-03-26 ENCOUNTER — Ambulatory Visit (INDEPENDENT_AMBULATORY_CARE_PROVIDER_SITE_OTHER): Payer: Medicaid Other | Admitting: Orthopedic Surgery

## 2017-04-02 ENCOUNTER — Ambulatory Visit (INDEPENDENT_AMBULATORY_CARE_PROVIDER_SITE_OTHER): Payer: Medicaid Other | Admitting: Orthopedic Surgery

## 2017-04-02 DIAGNOSIS — L97421 Non-pressure chronic ulcer of left heel and midfoot limited to breakdown of skin: Secondary | ICD-10-CM

## 2017-04-02 DIAGNOSIS — L97521 Non-pressure chronic ulcer of other part of left foot limited to breakdown of skin: Secondary | ICD-10-CM | POA: Diagnosis not present

## 2017-04-02 NOTE — Progress Notes (Signed)
Office Visit Note   Patient: Dan BarSamuel Parkhill           Date of Birth: 07-09-1960           MRN: [id number] Visit Date: 04/02/2017              Requested by: Fleet ContrasAvbuere, Edwin, MD [address] [place], [place] [id number] PCP: Fleet ContrasAvbuere, Edwin, MD  No chief complaint on file.     HPI: Patient is a 56 year old gentleman presents in follow-up for left heel ulcer patient states he's noticed some increased drainage and soreness from his second toe, following trimming his toenails a week ago. Pain with weightbearing only. Patient states he tries to keep it elevated and has been using Bactroban dressing changes.  Assessment & Plan: Visit Diagnoses:  1. Ulcer of left heel, limited to breakdown of skin (HCC)   2. Ulcer of toe of left foot, limited to breakdown of skin (HCC)     Plan: Ulcer debridement of skin and soft tissue. continue Bactroban dressing changes continue with the extra-depth shoes and custom orthotics minimize weightbearing.  Follow-Up Instructions: Return in about 4 weeks (around 04/30/2017).   Ortho Exam  Patient is alert, oriented, no adenopathy, well-dressed, normal affect, normal respiratory effort. Examination patient's both feet are plantigrade. He has no leg ulcers no ulcers in the plantar aspect the right foot status post transmetatarsal amputation of the right. The right foot is well-healed with no open ulcers in the calf heel or foot. Examination the left foot he has recurrent ulceration over the plantar aspect of the left heel. After informed consent a 10 blade knife was used to debride the skin and soft tissue back to healthy viable granulation tissue the ulcer is 20 mm in diameter and 2 mm deep. Silver nitrate was used for hemostasis as were 2 x 2 and a Band-Aid was applied. There is no redness no cellulitis no odor no drainage from the left foot. There are no venous ulcers. Has new ulcer to dorsum of second toe at base of nail bed. This is 4 mm in diameter with  serosanguinous drainage. No odor, erythema or sign of infection. Is 1 mm deep.  Imaging: No results found. No images are attached to the encounter.  Labs: Lab Results  Component Value Date   HGBA1C 7.1 (H) 09/24/2016   HGBA1C 6.8 (H) 06/17/2014   HGBA1C (H) 11/26/2006    10.4 (NOTE)   The ADA recommends the following therapeutic goals for glycemic   control related to Hgb A1C measurement:   Goal of Therapy:   < 7.0% Hgb A1C   Action Suggested:  > 8.0% Hgb A1C   Ref:  Diabetes Care, 22, Suppl. 1, 1999   ESRSEDRATE 45 (H) 06/17/2014   CRP 1.5 (H) 06/17/2014   REPTSTATUS 06/19/2014 FINAL 06/17/2014   GRAMSTAIN  06/17/2014    RARE WBC PRESENT, PREDOMINANTLY PMN NO SQUAMOUS EPITHELIAL CELLS SEEN NO ORGANISMS SEEN [facility]formed at [facility]    CULT  06/17/2014    MULTIPLE ORGANISMS PRESENT, NONE PREDOMINANT Note: NO STAPHYLOCOCCUS AUREUS ISOLATED NO GROUP A STREP (S.PYOGENES) ISOLATED Performed at [facility]    LABORGA STAPHYLOCOCCUS AUREUS 11/16/2007   LABORGA MORGANELLA MORGANII 11/16/2007    Orders:  No orders of the defined types were placed in this encounter.  No orders of the defined types were placed in this encounter.    Procedures: No procedures performed  Clinical Data: No additional findings.  ROS:  All other systems negative,  except as noted in the HPI. Review of Systems  Objective: Vital Signs: There were no vitals taken for this visit.  Specialty Comments:  No specialty comments available.  PMFS History: Patient Active Problem List   Diagnosis Date Noted  . Ulcer of toe of left foot, limited to breakdown of skin (HCC) 04/02/2017  . Acute osteomyelitis of toe, right (HCC)   . Diabetic polyneuropathy associated with type 2 diabetes mellitus (HCC) 08/14/2016  . 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 Ulcer of left heel, limited to breakdown of skin (HCC) 05/15/2016  .  Amputated toe of right foot (HCC) 07/27/2015  . Great toe pain 06/17/2014  . Type II diabetes mellitus with peripheral circulatory disorder (HCC) 06/17/2014  . Elevated transaminase level 06/17/2014  . HTN (hypertension) 06/17/2014  . Visual disturbance    Past Medical History:  Diagnosis Date  . Blood transfusion 2009  . Diabetes mellitus    type 2  . Hyperlipemia   . Hypertension    denies  . Leg swelling   . Osteomyelitis (HCC)    right 5th toe  . Visual disturbance     Family History  Problem Relation Age of Onset  . Cancer Mother        breast    Past Surgical History:  Procedure Laterality Date  . AMPUTATION Left 2012   Left Great Toe  . AMPUTATION Right 04/20/2015   Procedure: Right Great Toe Amputation;  Surgeon: [name], MD;  Location: [facility] OR;  Service: Orthopedics;  Laterality: Right;  . AMPUTATION Right 07/27/2015   Procedure: Right 2nd Toe Amputation;  Surgeon: [name], MD;  Location: [facility] OR;  Service: Orthopedics;  Laterality: Right;  . AMPUTATION Right 09/24/2016   Procedure: Right 5th Toe Amputation;  Surgeon: [name], MD;  Location: [facility] OR;  Service: Orthopedics;  Laterality: Right;  . AMPUTATION Right 01/07/2017   Procedure: Right Foot 3rd and 4th Toe Amputation;  Surgeon: [name], MD;  Location: [facility] OR;  Service: Orthopedics;  Laterality: Right;  . CHOLECYSTECTOMY  03/2011  . EYE SURGERY     lazer  . foot surgery  11/2007   small toe on left foot removed.  Marland Kitchen NASAL SEPTUM SURGERY  1981   Due to broken nose. Surgery was to repair how bone healed.  Marland Kitchen PARS PLANA VITRECTOMY  11/17/2011   Procedure: PARS PLANA VITRECTOMY WITH 23 GAUGE;  Surgeon: Shade Flood, MD;  Location: [facility] OR;  Service: Ophthalmology;  Laterality: Right;  With Endolaser  . TOE AMPUTATION Left    5 th  . TONSILLECTOMY     Social History   Occupational History  . Not on file.   Social History Main Topics  . Smoking status: Former Smoker    Years: 5.00    Types:  Cigarettes    Quit date: 03/25/1979  . Smokeless tobacco: Never Used  . Alcohol use Yes     Comment: occasional  24 pk of malt liqour a month  . Drug use: No  . Sexual activity: Not on file

## 2017-05-11 ENCOUNTER — Ambulatory Visit (INDEPENDENT_AMBULATORY_CARE_PROVIDER_SITE_OTHER): Payer: Medicaid Other | Admitting: Orthopedic Surgery

## 2017-05-14 ENCOUNTER — Encounter (INDEPENDENT_AMBULATORY_CARE_PROVIDER_SITE_OTHER): Payer: Self-pay | Admitting: Orthopedic Surgery

## 2017-05-14 ENCOUNTER — Ambulatory Visit (INDEPENDENT_AMBULATORY_CARE_PROVIDER_SITE_OTHER): Payer: Medicaid Other | Admitting: Orthopedic Surgery

## 2017-05-14 DIAGNOSIS — L97421 Non-pressure chronic ulcer of left heel and midfoot limited to breakdown of skin: Secondary | ICD-10-CM | POA: Diagnosis not present

## 2017-05-14 NOTE — Progress Notes (Signed)
Office Visit Note   Patient: [name]           Date of Birth: 28-Dec-1960           MRN: [id number] Visit Date: 05/14/2017              Requested by: Fleet ContrasAvbuere, Edwin, MD [address] [place], [place] [id number] PCP: Fleet ContrasAvbuere, Edwin, MD  Chief Complaint  Patient presents with  . Left Foot - Follow-up      HPI: Patient is a 56 year old gentleman who presents in follow-up for recurrent chronic ulcer left heel.  Patient states he also used to wear compression stockings but he states he has been having increased swelling lately.  Assessment & Plan: Visit Diagnoses:  1. Ulcer of left heel, limited to breakdown of skin (HCC)     Plan: Discussed the importance of getting pressure off the left heel discussed the importance of wearing a 20/30 compression stocking at all times continue with Bactroban dressing changes to the heel ulcer.  Patient states he would like to follow-up when this becomes painful or completely closes over.  Follow-Up Instructions: Return if symptoms worsen or fail to improve.   Ortho Exam  Patient is alert, oriented, no adenopathy, well-dressed, normal affect, normal respiratory effort. Patient has an antalgic gait.  He has increased venous stasis swelling with brawny skin color changes to his left leg there are 2 small venous ulcers which are superficial about 2 mm in diameter with good bleeding.  Patient's foot has completely callused over.  After informed consent the skin and soft tissue was debrided there was purulence beneath the callus skin after debridement the tissue had good granulation tissue this did not probe to bone or tendon the post debridement ulcer was 5 x 2 cm and 3 mm deep.  Bactroban and gauze was applied.  There is no cellulitis no signs of any deep infection.  Imaging: No results found. No images are attached to the encounter.  Labs: Lab Results  Component Value Date   HGBA1C 7.1 (H) 09/24/2016   HGBA1C 6.8 (H) 06/17/2014   HGBA1C  (H) 11/26/2006    10.4 (NOTE)   The ADA recommends the following therapeutic goals for glycemic   control related to Hgb A1C measurement:   Goal of Therapy:   < 7.0% Hgb A1C   Action Suggested:  > 8.0% Hgb A1C   Ref:  Diabetes Care, 22, Suppl. 1, 1999   ESRSEDRATE 45 (H) 06/17/2014   CRP 1.5 (H) 06/17/2014   REPTSTATUS 06/19/2014 FINAL 06/17/2014   GRAMSTAIN  06/17/2014    RARE WBC PRESENT, PREDOMINANTLY PMN NO SQUAMOUS EPITHELIAL CELLS SEEN NO ORGANISMS SEEN Performed at [facility]    CULT  06/17/2014    MULTIPLE ORGANISMS PRESENT, NONE PREDOMINANT Note: NO STAPHYLOCOCCUS AUREUS ISOLATED NO GROUP A STREP (S.PYOGENES) ISOLATED Performed at [facility]    LABORGA STAPHYLOCOCCUS AUREUS 11/16/2007   LABORGA MORGANELLA MORGANII 11/16/2007    @LABSALLVALUES (HGBA1)@  @BMI1 @  Orders:  No orders of the defined types were placed in this encounter.  No orders of the defined types were placed in this encounter.    Procedures: No procedures performed  Clinical Data: No additional findings.  ROS:  All other systems negative, except as noted in the HPI. Review of Systems  Objective: Vital Signs: There were no vitals taken for this visit.  Specialty Comments:  No specialty comments available.  PMFS History: Patient Active Problem List   Diagnosis Date Noted  .  Ulcer of toe of left foot, limited to breakdown of skin (HCC) 04/02/2017  . Acute osteomyelitis of toe, right (HCC)   . Diabetic polyneuropathy associated with type 2 diabetes mellitus (HCC) 08/14/2016  . 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 Ulcer of left heel, limited to breakdown of skin (HCC) 05/15/2016  . Amputated toe of right foot (HCC) 07/27/2015  . Great toe pain 06/17/2014  . Type II diabetes mellitus with peripheral circulatory disorder (HCC) 06/17/2014  . Elevated transaminase level 06/17/2014  . HTN (hypertension)  06/17/2014  . Visual disturbance    Past Medical History:  Diagnosis Date  . Blood transfusion 2009  . Diabetes mellitus    type 2  . Hyperlipemia   . Hypertension    denies  . Leg swelling   . Osteomyelitis (HCC)    right 5th toe  . Visual disturbance     Family History  Problem Relation Age of Onset  . Cancer Mother        breast    Past Surgical History:  Procedure Laterality Date  . AMPUTATION Left 2012   Left Great Toe  . AMPUTATION Right 04/20/2015   Procedure: Right Great Toe Amputation;  Surgeon: Nadara MustardMarcus Drequan Ironside V, MD;  [facility];  Service: Orthopedics;  Laterality: Right;  . AMPUTATION Right 07/27/2015   Procedure: Right 2nd Toe Amputation;  Surgeon: Nadara MustardMarcus Ruta Capece V, MD;  [facility];  Service: Orthopedics;  Laterality: Right;  . AMPUTATION Right 09/24/2016   Procedure: Right 5th Toe Amputation;  Surgeon: Nadara MustardMarcus Bernie Ransford V, MD;  [facility];  Service: Orthopedics;  Laterality: Right;  . AMPUTATION Right 01/07/2017   Procedure: Right Foot 3rd and 4th Toe Amputation;  Surgeon: Nadara Mustarduda, Maxime Beckner V, MD;  [facility];  Service: Orthopedics;  Laterality: Right;  . CHOLECYSTECTOMY  03/2011  . EYE SURGERY     lazer  . foot surgery  11/2007   small toe on left foot removed.  Marland Kitchen. NASAL SEPTUM SURGERY  1981   Due to broken nose. Surgery was to repair how bone healed.  Marland Kitchen. PARS PLANA VITRECTOMY  11/17/2011   Procedure: PARS PLANA VITRECTOMY WITH 23 GAUGE;  Surgeon: Shade FloodGreer Geiger, MD;  [facility];  Service: Ophthalmology;  Laterality: Right;  With Endolaser  . TOE AMPUTATION Left    5 th  . TONSILLECTOMY     Social History   Occupational History  . Not on file  Tobacco Use  . Smoking status: Former Smoker    Years: 5.00    Types: Cigarettes    Last attempt to quit: 03/25/1979    Years since quitting: 38.1  . Smokeless tobacco: Never Used  Substance and Sexual Activity  . Alcohol use: Yes    Comment: occasional  24 pk of malt liqour a month  . Drug use: No  . Sexual  activity: Not on file

## 2017-09-01 ENCOUNTER — Ambulatory Visit (INDEPENDENT_AMBULATORY_CARE_PROVIDER_SITE_OTHER): Payer: Medicaid Other | Admitting: Orthopedic Surgery

## 2017-09-03 ENCOUNTER — Ambulatory Visit (INDEPENDENT_AMBULATORY_CARE_PROVIDER_SITE_OTHER): Payer: Medicaid Other | Admitting: Orthopedic Surgery

## 2017-09-03 ENCOUNTER — Encounter (INDEPENDENT_AMBULATORY_CARE_PROVIDER_SITE_OTHER): Payer: Self-pay | Admitting: Orthopedic Surgery

## 2017-09-03 VITALS — Ht 69.0 in | Wt 265.0 lb

## 2017-09-03 DIAGNOSIS — L97421 Non-pressure chronic ulcer of left heel and midfoot limited to breakdown of skin: Secondary | ICD-10-CM | POA: Diagnosis not present

## 2017-09-03 NOTE — Progress Notes (Signed)
Office Visit Note   Patient: [name]           Date of Birth: 07-28-1960           MRN: [id number] Visit Date: 09/03/2017              Requested by: Fleet Contras, MD [address] [place], [place] [address] PCP: Fleet Contras, MD  Chief Complaint  Patient presents with  . Left Foot - Wound Check      HPI: Patient is a 57 year old gentleman with diabetic insensate neuropathy who states he was trimming a callus on the plantar aspect of his left foot and noticed pus come out.  Patient states he does have some bloody drainage at this time denies any fever chills denies any odor.  Assessment & Plan: Visit Diagnoses:  1. Ulcer of left heel, limited to breakdown of skin (HCC)     Plan: Ulcer was debrided of skin and soft tissue there is excellent healthy granulation tissue the base Iodosorb plus gauze was applied.  Patient states he would like to continue with wound care on his own protected weightbearing we will follow-up as needed continue compression stockings.  Follow-Up Instructions: Return if symptoms worsen or fail to improve.   Ortho Exam  Patient is alert, oriented, no adenopathy, well-dressed, normal affect, normal respiratory effort. Examination patient's foot is plantigrade there is no rocker-bottom deformity no signs of any acute Charcot arthropathy he has a large callus over the ulcer over the left heel.  After informed consent a 10 blade knife was used to debride the skin and soft tissue back to healthy viable granulation tissue this was touched with silver nitrate the ulcer is 10 mm in diameter and 3 mm deep there is no exposed bone or tendon.  There is no abscess.  Imaging: No results found. No images are attached to the encounter.  Labs: Lab Results  Component Value Date   HGBA1C 7.1 (H) 09/24/2016   HGBA1C 6.8 (H) 06/17/2014   HGBA1C (H) 11/26/2006    10.4 (NOTE)   The ADA recommends the following therapeutic goals for glycemic   control related  to Hgb A1C measurement:   Goal of Therapy:   < 7.0% Hgb A1C   Action Suggested:  > 8.0% Hgb A1C   Ref:  Diabetes Care, 22, Suppl. 1, 1999   ESRSEDRATE 45 (H) 06/17/2014   CRP 1.5 (H) 06/17/2014   REPTSTATUS 06/19/2014 FINAL 06/17/2014   GRAMSTAIN  06/17/2014    RARE WBC PRESENT, PREDOMINANTLY PMN NO SQUAMOUS EPITHELIAL CELLS SEEN NO ORGANISMS SEEN Performed at [facility]    CULT  06/17/2014    MULTIPLE ORGANISMS PRESENT, NONE PREDOMINANT Note: NO STAPHYLOCOCCUS AUREUS ISOLATED NO GROUP A STREP (S.PYOGENES) ISOLATED Performed at [facility]    LABORGA STAPHYLOCOCCUS AUREUS 11/16/2007   LABORGA MORGANELLA MORGANII 11/16/2007    @LABSALLVALUES (HGBA1)@  Body mass index is 39.13 kg/m.  Orders:  No orders of the defined types were placed in this encounter.  No orders of the defined types were placed in this encounter.    Procedures: No procedures performed  Clinical Data: No additional findings.  ROS:  All other systems negative, except as noted in the HPI. Review of Systems  Objective: Vital Signs: Ht 5\' 9"  (1.753 m)   Wt 265 lb (120.2 kg)   BMI 39.13 kg/m   Specialty Comments:  No specialty comments available.  PMFS History: Patient Active Problem List   Diagnosis Date Noted  .  Ulcer of toe of left foot, limited to breakdown of skin (HCC) 04/02/2017  . Acute osteomyelitis of toe, right (HCC)   . Diabetic polyneuropathy associated with type 2 diabetes mellitus (HCC) 08/14/2016  . 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 Ulcer of left heel, limited to breakdown of skin (HCC) 05/15/2016  . Amputated toe of right foot (HCC) 07/27/2015  . Great toe pain 06/17/2014  . Type II diabetes mellitus with peripheral circulatory disorder (HCC) 06/17/2014  . Elevated transaminase level 06/17/2014  . HTN (hypertension) 06/17/2014  . Visual disturbance    Past Medical History:  Diagnosis Date    . Blood transfusion 2009  . Diabetes mellitus    type 2  . Hyperlipemia   . Hypertension    denies  . Leg swelling   . Osteomyelitis (HCC)    right 5th toe  . Visual disturbance     Family History  Problem Relation Age of Onset  . Cancer Mother        breast    Past Surgical History:  Procedure Laterality Date  . AMPUTATION Left 2012   Left Great Toe  . AMPUTATION Right 04/20/2015   Procedure: Right Great Toe Amputation;  Surgeon: Nadara MustardMarcus Duda V, MD;  [facility];  Service: Orthopedics;  Laterality: Right;  . AMPUTATION Right 07/27/2015   Procedure: Right 2nd Toe Amputation;  Surgeon: Nadara MustardMarcus Duda V, MD;  [facility];  Service: Orthopedics;  Laterality: Right;  . AMPUTATION Right 09/24/2016   Procedure: Right 5th Toe Amputation;  Surgeon: Nadara MustardMarcus Duda V, MD;  [facility];  Service: Orthopedics;  Laterality: Right;  . AMPUTATION Right 01/07/2017   Procedure: Right Foot 3rd and 4th Toe Amputation;  Surgeon: Nadara Mustarduda, Marcus V, MD;  [facility];  Service: Orthopedics;  Laterality: Right;  . CHOLECYSTECTOMY  03/2011  . EYE SURGERY     lazer  . foot surgery  11/2007   small toe on left foot removed.  Marland Kitchen. NASAL SEPTUM SURGERY  1981   Due to broken nose. Surgery was to repair how bone healed.  Marland Kitchen. PARS PLANA VITRECTOMY  11/17/2011   Procedure: PARS PLANA VITRECTOMY WITH 23 GAUGE;  Surgeon: Shade FloodGreer Geiger, MD;  [facility];  Service: Ophthalmology;  Laterality: Right;  With Endolaser  . TOE AMPUTATION Left    5 th  . TONSILLECTOMY     Social History   Occupational History  . Not on file  Tobacco Use  . Smoking status: Former Smoker    Years: 5.00    Types: Cigarettes    Last attempt to quit: 03/25/1979    Years since quitting: 38.4  . Smokeless tobacco: Never Used  Substance and Sexual Activity  . Alcohol use: Yes    Comment: occasional  24 pk of malt liqour a month  . Drug use: No  . Sexual activity: Not on file

## 2017-10-15 ENCOUNTER — Ambulatory Visit (INDEPENDENT_AMBULATORY_CARE_PROVIDER_SITE_OTHER): Payer: Medicaid Other | Admitting: Orthopedic Surgery

## 2017-10-15 ENCOUNTER — Encounter (INDEPENDENT_AMBULATORY_CARE_PROVIDER_SITE_OTHER): Payer: Self-pay | Admitting: Orthopedic Surgery

## 2017-10-15 DIAGNOSIS — I87323 Chronic venous hypertension (idiopathic) with inflammation of bilateral lower extremity: Secondary | ICD-10-CM

## 2017-10-15 DIAGNOSIS — L97421 Non-pressure chronic ulcer of left heel and midfoot limited to breakdown of skin: Secondary | ICD-10-CM

## 2017-10-15 DIAGNOSIS — E1142 Type 2 diabetes mellitus with diabetic polyneuropathy: Secondary | ICD-10-CM

## 2017-10-15 NOTE — Progress Notes (Signed)
Office Visit Note   Patient: [name]           Date of Birth: 1961-05-20           MRN: [id number] Visit Date: 10/15/2017              Requested by: Fleet Contras, MD [address] [place], [place] [address] PCP: Fleet Contras, MD  Chief Complaint  Patient presents with  . Left Foot - Follow-up, Injury      HPI: Patient is a 57 year old gentleman who presents in follow-up for Wagoner grade 1 ulcer left heel.  Patient states that the compression stocking stopped working on his left leg and he is wearing Ace wrap on the left leg he is wearing the medical compression stocking on the right leg.  Patient states that he cut his foot while trying to trim the ulcer on his left foot with a razor blade.  Assessment & Plan: Visit Diagnoses:  1. Ulcer of left heel, limited to breakdown of skin (HCC)   2. Diabetic polyneuropathy associated with type 2 diabetes mellitus (HCC)   3. Chronic venous hypertension (idiopathic) with inflammation of bilateral lower extremity     Plan: Ulcer was debrided of skin and soft tissue.  Recommended resume wearing the compression stocking to left leg he has multiple indentations in the skin from the Ace wrap and discussed my concern with him possibly developing an ulcer from these deep indentations.  Patient states he would like to follow-up as needed.  Follow-Up Instructions: Return if symptoms worsen or fail to improve.   Ortho Exam  Patient is alert, oriented, no adenopathy, well-dressed, normal affect, normal respiratory effort. Examination patient has brawny skin color changes in both legs with venous insufficiency.  There are indentations from the Ace wrap in the left leg with some areas of impending skin breakdown from these indentations.  Examination of the foot he has a large Waggoner grade 1 ulcer on the plantar aspect of the left heel there is no redness no cellulitis no drainage.  After informed consent a 10 blade knife was used to debride  the skin and soft tissue back to healthy viable granulation tissue.  This was touched with silver nitrate a 2 x 2 plus Bactroban and tape was applied.  The ulcer measures 3 cm in diameter 1 mm deep.  Imaging: No results found. No images are attached to the encounter.  Labs: Lab Results  Component Value Date   HGBA1C 7.1 (H) 09/24/2016   HGBA1C 6.8 (H) 06/17/2014   HGBA1C (H) 11/26/2006    10.4 (NOTE)   The ADA recommends the following therapeutic goals for glycemic   control related to Hgb A1C measurement:   Goal of Therapy:   < 7.0% Hgb A1C   Action Suggested:  > 8.0% Hgb A1C   Ref:  Diabetes Care, 22, Suppl. 1, 1999   ESRSEDRATE 45 (H) 06/17/2014   CRP 1.5 (H) 06/17/2014   REPTSTATUS 06/19/2014 FINAL 06/17/2014   GRAMSTAIN  06/17/2014    RARE WBC PRESENT, PREDOMINANTLY PMN NO SQUAMOUS EPITHELIAL CELLS SEEN NO ORGANISMS SEEN Performed at [facility]    CULT  06/17/2014    MULTIPLE ORGANISMS PRESENT, NONE PREDOMINANT Note: NO STAPHYLOCOCCUS AUREUS ISOLATED NO GROUP A STREP (S.PYOGENES) ISOLATED Performed at [facility]    LABORGA STAPHYLOCOCCUS AUREUS 11/16/2007   LABORGA MORGANELLA MORGANII 11/16/2007    Lab Results  Component Value Date/Time   HGBA1C 7.1 (H) 09/24/2016 09:47 AM  HGBA1C 6.8 (H) 06/17/2014 12:16 AM   HGBA1C (H) 11/26/2006 12:00 AM    10.4 (NOTE)   The ADA recommends the following therapeutic goals for glycemic   control related to Hgb A1C measurement:   Goal of Therapy:   < 7.0% Hgb A1C   Action Suggested:  > 8.0% Hgb A1C   Ref:  Diabetes Care, 22, Suppl. 1, 1999    There is no height or weight on file to calculate BMI.  Orders:  No orders of the defined types were placed in this encounter.  No orders of the defined types were placed in this encounter.    Procedures: No procedures performed  Clinical Data: No additional findings.  ROS:  All other systems negative, except as noted in the HPI. Review of  Systems  Objective: Vital Signs: There were no vitals taken for this visit.  Specialty Comments:  No specialty comments available.  PMFS History: Patient Active Problem List   Diagnosis Date Noted  . Ulcer of toe of left foot, limited to breakdown of skin (HCC) 04/02/2017  . Acute osteomyelitis of toe, right (HCC)   . Diabetic polyneuropathy associated with type 2 diabetes mellitus (HCC) 08/14/2016  . 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 Ulcer of left heel, limited to breakdown of skin (HCC) 05/15/2016  . Amputated toe of right foot (HCC) 07/27/2015  . Great toe pain 06/17/2014  . Type II diabetes mellitus with peripheral circulatory disorder (HCC) 06/17/2014  . Elevated transaminase level 06/17/2014  . HTN (hypertension) 06/17/2014  . Visual disturbance    Past Medical History:  Diagnosis Date  . Blood transfusion 2009  . Diabetes mellitus    type 2  . Hyperlipemia   . Hypertension    denies  . Leg swelling   . Osteomyelitis (HCC)    right 5th toe  . Visual disturbance     Family History  Problem Relation Age of Onset  . Cancer Mother        breast    Past Surgical History:  Procedure Laterality Date  . AMPUTATION Left 2012   Left Great Toe  . AMPUTATION Right 04/20/2015   Procedure: Right Great Toe Amputation;  Surgeon: [name], MD;  Location: [facility] OR;  Service: Orthopedics;  Laterality: Right;  . AMPUTATION Right 07/27/2015   Procedure: Right 2nd Toe Amputation;  Surgeon: [name], MD;  Location[facility] OR;  Service: Orthopedics;  Laterality: Right;  . AMPUTATION Right 09/24/2016   Procedure: Right 5th Toe Amputation;  Surgeon: [name], MD;  Location: [facility] OR;  Service: Orthopedics;  Laterality: Right;  . AMPUTATION Right 01/07/2017   Procedure: Right Foot 3rd and 4th Toe Amputation;  Surgeon: [name], MD;  Location: [facility] OR;  Service: Orthopedics;  Laterality: Right;  . CHOLECYSTECTOMY   03/2011  . EYE SURGERY     lazer  . foot surgery  11/2007   small toe on left foot removed.  Marland Kitchen NASAL SEPTUM SURGERY  1981   Due to broken nose. Surgery was to repair how bone healed.  Marland Kitchen PARS PLANA VITRECTOMY  11/17/2011   Procedure: PARS PLANA VITRECTOMY WITH 23 GAUGE;  Surgeon: Shade Flood, MD;  Location: [facility] OR;  Service: Ophthalmology;  Laterality: Right;  With Endolaser  . TOE AMPUTATION Left    5 th  . TONSILLECTOMY     Social History   Occupational History  . Not on file  Tobacco Use  .  Smoking status: Former Smoker    Years: 5.00    Types: Cigarettes    Last attempt to quit: 03/25/1979    Years since quitting: 38.5  . Smokeless tobacco: Never Used  Substance and Sexual Activity  . Alcohol use: Yes    Comment: occasional  24 pk of malt liqour a month  . Drug use: No  . Sexual activity: Not on file

## 2017-12-24 ENCOUNTER — Encounter (INDEPENDENT_AMBULATORY_CARE_PROVIDER_SITE_OTHER): Payer: Self-pay | Admitting: Family

## 2017-12-24 ENCOUNTER — Ambulatory Visit (INDEPENDENT_AMBULATORY_CARE_PROVIDER_SITE_OTHER): Payer: Medicaid Other | Admitting: Family

## 2017-12-24 VITALS — Ht 69.0 in | Wt 265.0 lb

## 2017-12-24 DIAGNOSIS — I87323 Chronic venous hypertension (idiopathic) with inflammation of bilateral lower extremity: Secondary | ICD-10-CM | POA: Diagnosis not present

## 2017-12-24 DIAGNOSIS — L97421 Non-pressure chronic ulcer of left heel and midfoot limited to breakdown of skin: Secondary | ICD-10-CM | POA: Diagnosis not present

## 2017-12-24 NOTE — Progress Notes (Signed)
Office Visit Note   Patient: [name]           Date of Birth: 1960-08-02           MRN: [id number] Visit Date: 12/24/2017              Requested by: Fleet ContrasAvbuere, Edwin, MD [address] [place], [place] [id number] PCP: Fleet ContrasAvbuere, Edwin, MD  Chief Complaint  Patient presents with  . Left Foot - Pain      HPI: Patient is a 57 year old gentleman who presents in follow-up for Wagoner grade 1 ulcer left heel.  Today presents concerned for discoloration to heel, plantar aspect. Patient states he has also been trimming the ulcer on his left foot with a razor blade.  Assessment & Plan: Visit Diagnoses:  No diagnosis found.  Plan: pleased with improvement in left foot. No open areas. Reassurance provided. Ulcer was debrided of skin and soft tissue.  Recommended continue wearing the compression stocking to left leg. Patient states he would like to follow-up as needed.  Follow-Up Instructions: No follow-ups on file.   Ortho Exam  Patient is alert, oriented, no adenopathy, well-dressed, normal affect, normal respiratory effort. Examination patient has brawny skin color changes in both legs with venous insufficiency.  Examination of the foot his heel ulcer is well healed. Does have some midfoot callus is 2 cm in diameter and 2 mm thick. there is no redness no cellulitis no drainage.  After informed consent a 10 blade knife was used to debride the skin and soft tissue back to healthy tissue.    Imaging: No results found. No images are attached to the encounter.  Labs: Lab Results  Component Value Date   HGBA1C 7.1 (H) 09/24/2016   HGBA1C 6.8 (H) 06/17/2014   HGBA1C (H) 11/26/2006    10.4 (NOTE)   The ADA recommends the following therapeutic goals for glycemic   control related to Hgb A1C measurement:   Goal of Therapy:   < 7.0% Hgb A1C   Action Suggested:  > 8.0% Hgb A1C   Ref:  Diabetes Care, 22, Suppl. 1, 1999   ESRSEDRATE 45 (H) 06/17/2014   CRP 1.5 (H) 06/17/2014   REPTSTATUS  06/19/2014 FINAL 06/17/2014   GRAMSTAIN  06/17/2014    RARE WBC PRESENT, PREDOMINANTLY PMN NO SQUAMOUS EPITHELIAL CELLS SEEN NO ORGANISMS SEEN [facility]    CULT  06/17/2014    MULTIPLE ORGANISMS PRESENT, NONE PREDOMINANT Note: NO STAPHYLOCOCCUS AUREUS ISOLATED NO GROUP A STREP (S.PYOGENES) ISOLATED Performed at [facility]    LABORGA STAPHYLOCOCCUS AUREUS 11/16/2007   LABORGA MORGANELLA MORGANII 11/16/2007    Lab Results  Component Value Date/Time   HGBA1C 7.1 (H) 09/24/2016 09:47 AM   HGBA1C 6.8 (H) 06/17/2014 12:16 AM   HGBA1C (H) 11/26/2006 12:00 AM    10.4 (NOTE)   The ADA recommends the following therapeutic goals for glycemic   control related to Hgb A1C measurement:   Goal of Therapy:   < 7.0% Hgb A1C   Action Suggested:  > 8.0% Hgb A1C   Ref:  Diabetes Care, 22, Suppl. 1, 1999    Body mass index is 39.13 kg/m.  Orders:  No orders of the defined types were placed in this encounter.  No orders of the defined types were placed in this encounter.    Procedures: No procedures performed  Clinical Data: No additional findings.  ROS:  All other systems negative, except as noted in the HPI. Review of Systems  Constitutional: Negative for chills and fever.  Cardiovascular: Positive for leg swelling.  Skin: Positive for color change. Negative for wound.    Objective: Vital Signs: Ht 5\' 9"  (1.753 m)   Wt 265 lb (120.2 kg)   BMI 39.13 kg/m   Specialty Comments:  No specialty comments available.  PMFS History: Patient Active Problem List   Diagnosis Date Noted  . Ulcer of toe of left foot, limited to breakdown of skin (HCC) 04/02/2017  . Acute osteomyelitis of toe, right (HCC)   . Diabetic polyneuropathy associated with type 2 diabetes mellitus (HCC) 08/14/2016  . 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 Ulcer of left heel, limited to breakdown of skin (HCC)  05/15/2016  . Amputated toe of right foot (HCC) 07/27/2015  . Great toe pain 06/17/2014  . Type II diabetes mellitus with peripheral circulatory disorder (HCC) 06/17/2014  . Elevated transaminase level 06/17/2014  . HTN (hypertension) 06/17/2014  . Visual disturbance    Past Medical History:  Diagnosis Date  . Blood transfusion 2009  . Diabetes mellitus    type 2  . Hyperlipemia   . Hypertension    denies  . Leg swelling   . Osteomyelitis (HCC)    right 5th toe  . Visual disturbance     Family History  Problem Relation Age of Onset  . Cancer Mother        breast    Past Surgical History:  Procedure Laterality Date  . AMPUTATION Left 2012   Left Great Toe  . AMPUTATION Right 04/20/2015   Procedure: Right Great Toe Amputation;  Surgeon: [name], MD;  Location: [facility] OR;  Service: Orthopedics;  Laterality: Right;  . AMPUTATION Right 07/27/2015   Procedure: Right 2nd Toe Amputation;  Surgeon: [name], MD;  Location: [facility] OR;  Service: Orthopedics;  Laterality: Right;  . AMPUTATION Right 09/24/2016   Procedure: Right 5th Toe Amputation;  Surgeon: [name], MD;  Location: [facility] OR;  Service: Orthopedics;  Laterality: Right;  . AMPUTATION Right 01/07/2017   Procedure: Right Foot 3rd and 4th Toe Amputation;  Surgeon: [name], MD;  Location: [facility] OR;  Service: Orthopedics;  Laterality: Right;  . CHOLECYSTECTOMY  03/2011  . EYE SURGERY     lazer  . foot surgery  11/2007   small toe on left foot removed.  Marland Kitchen NASAL SEPTUM SURGERY  1981   Due to broken nose. Surgery was to repair how bone healed.  Marland Kitchen PARS PLANA VITRECTOMY  11/17/2011   Procedure: PARS PLANA VITRECTOMY WITH 23 GAUGE;  Surgeon: Shade Flood, MD;  Location: [facility] OR;  Service: Ophthalmology;  Laterality: Right;  With Endolaser  . TOE AMPUTATION Left    5 th  . TONSILLECTOMY     Social History   Occupational History  . Not on file  Tobacco Use  . Smoking status: Former Smoker    Years: 5.00    Types:  Cigarettes    Last attempt to quit: 03/25/1979    Years since quitting: 38.7  . Smokeless tobacco: Never Used  Substance and Sexual Activity  . Alcohol use: Yes    Comment: occasional  24 pk of malt liqour a month  . Drug use: No  . Sexual activity: Not on file

## 2018-02-08 ENCOUNTER — Encounter (INDEPENDENT_AMBULATORY_CARE_PROVIDER_SITE_OTHER): Payer: Self-pay | Admitting: Orthopedic Surgery

## 2018-02-08 ENCOUNTER — Ambulatory Visit (INDEPENDENT_AMBULATORY_CARE_PROVIDER_SITE_OTHER): Payer: Medicaid Other | Admitting: Orthopedic Surgery

## 2018-02-08 VITALS — Ht 69.0 in | Wt 265.0 lb

## 2018-02-08 DIAGNOSIS — L97421 Non-pressure chronic ulcer of left heel and midfoot limited to breakdown of skin: Secondary | ICD-10-CM

## 2018-02-08 NOTE — Progress Notes (Signed)
Office Visit Note   Patient: [name]           Date of Birth: 04-16-1961           MRN: [id number] Visit Date: 02/08/2018              Requested by: Fleet ContrasAvbuere, Edwin, MD [address] [place], [place] [id number] PCP: Fleet ContrasAvbuere, Edwin, MD  Chief Complaint  Patient presents with  . Left Foot - Pain      HPI: Patient is a 57 year old gentleman who presents with a recurrent ulcer over the lateral aspect of the left heel.  Patient states his been wearing compression stockings on the left leg.  Few days ago he noticed some maceration from a new blister and patient states he started putting more weight on the right lower extremity.  Patient complains of thigh pain from favoring the right leg.  Patient states he is going back to his kneeling scooter.  Assessment & Plan: Visit Diagnoses:  1. Ulcer of left heel, limited to breakdown of skin (HCC)     Plan: Recommended using a kneeling scooter protected weightbearing patient has cut out his orthotic and has a extra-depth shoe which is over a year old.  Patient was given a prescription to call [employer] for new extra-depth shoes and custom orthotics to posterior around the ulcer on the heel.  Follow-Up Instructions: Return in about 4 weeks (around 03/08/2018).   Ortho Exam  Patient is alert, oriented, no adenopathy, well-dressed, normal affect, normal respiratory effort. Examination patient has completely cut out the orthotic beneath the ulcer.  There is no padding over this area.  After informed consent a 10 blade knife was used to debride the skin and soft tissue back to healthy viable granulation tissue.  The area of the blister was approximately 3 cm in diameter the area of granulation tissue was 10 mm in diameter and 1 mm deep there is healthy granulation tissue this does not probe to bone or tendon there is no cellulitis no odor no drainage.  Patient does have an antalgic gait.  Imaging: No results found. No images are attached to the  encounter.  Labs: Lab Results  Component Value Date   HGBA1C 7.1 (H) 09/24/2016   HGBA1C 6.8 (H) 06/17/2014   HGBA1C (H) 11/26/2006    10.4 (NOTE)   The ADA recommends the following therapeutic goals for glycemic   control related to Hgb A1C measurement:   Goal of Therapy:   < 7.0% Hgb A1C   Action Suggested:  > 8.0% Hgb A1C   Ref:  Diabetes Care, 22, Suppl. 1, 1999   ESRSEDRATE 45 (H) 06/17/2014   CRP 1.5 (H) 06/17/2014   REPTSTATUS 06/19/2014 FINAL 06/17/2014   GRAMSTAIN  06/17/2014    RARE WBC PRESENT, PREDOMINANTLY PMN NO SQUAMOUS EPITHELIAL CELLS SEEN NO ORGANISMS SEEN Performed at [facility]    CULT  06/17/2014    MULTIPLE ORGANISMS PRESENT, NONE PREDOMINANT Note: NO STAPHYLOCOCCUS AUREUS ISOLATED NO GROUP A STREP (S.PYOGENES) ISOLATED Performed at [facility]    LABORGA STAPHYLOCOCCUS AUREUS 11/16/2007   LABORGA MORGANELLA MORGANII 11/16/2007     Lab Results  Component Value Date   ALBUMIN 3.4 (L) 07/27/2015   ALBUMIN 3.3 (L) 04/20/2015   ALBUMIN 3.6 06/17/2014    Body mass index is 39.13 kg/m.  Orders:  No orders of the defined types were placed in this encounter.  No orders of the defined types were placed in this encounter.  Procedures: No procedures performed  Clinical Data: No additional findings.  ROS:  All other systems negative, except as noted in the HPI. Review of Systems  Objective: Vital Signs: Ht 5\' 9"  (1.753 m)   Wt 265 lb (120.2 kg)   BMI 39.13 kg/m   Specialty Comments:  No specialty comments available.  PMFS History: Patient Active Problem List   Diagnosis Date Noted  . Acute osteomyelitis of toe, right (HCC)   . Diabetic polyneuropathy associated with type 2 diabetes mellitus (HCC) 08/14/2016  . 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 Ulcer of left heel, limited to breakdown of skin (HCC) 05/15/2016  . Amputated toe of right foot (HCC)  07/27/2015  . Great toe pain 06/17/2014  . Type II diabetes mellitus with peripheral circulatory disorder (HCC) 06/17/2014  . Elevated transaminase level 06/17/2014  . HTN (hypertension) 06/17/2014  . Visual disturbance    Past Medical History:  Diagnosis Date  . Blood transfusion 2009  . Diabetes mellitus    type 2  . Hyperlipemia   . Hypertension    denies  . Leg swelling   . Osteomyelitis (HCC)    right 5th toe  . Visual disturbance     Family History  Problem Relation Age of Onset  . Cancer Mother        breast    Past Surgical History:  Procedure Laterality Date  . AMPUTATION Left 2012   Left Great Toe  . AMPUTATION Right 04/20/2015   Procedure: Right Great Toe Amputation;  Surgeon: [name], MD;  Location: [facility] OR;  Service: Orthopedics;  Laterality: Right;  . AMPUTATION Right 07/27/2015   Procedure: Right 2nd Toe Amputation;  Surgeon: [name], MD;  Location: [facility] OR;  Service: Orthopedics;  Laterality: Right;  . AMPUTATION Right 09/24/2016   Procedure: Right 5th Toe Amputation;  Surgeon: [name], MD;  Location: [facility] OR;  Service: Orthopedics;  Laterality: Right;  . AMPUTATION Right 01/07/2017   Procedure: Right Foot 3rd and 4th Toe Amputation;  Surgeon: [name], MD;  Location: [facility] OR;  Service: Orthopedics;  Laterality: Right;  . CHOLECYSTECTOMY  03/2011  . EYE SURGERY     lazer  . foot surgery  11/2007   small toe on left foot removed.  Marland Kitchen NASAL SEPTUM SURGERY  1981   Due to broken nose. Surgery was to repair how bone healed.  Marland Kitchen PARS PLANA VITRECTOMY  11/17/2011   Procedure: PARS PLANA VITRECTOMY WITH 23 GAUGE;  Surgeon: Shade Flood, MD;  Location: [facility] OR;  Service: Ophthalmology;  Laterality: Right;  With Endolaser  . TOE AMPUTATION Left    5 th  . TONSILLECTOMY     Social History   Occupational History  . Not on file  Tobacco Use  . Smoking status: Former Smoker    Years: 5.00    Types: Cigarettes    Last attempt to quit: 03/25/1979     Years since quitting: 38.9  . Smokeless tobacco: Never Used  Substance and Sexual Activity  . Alcohol use: Yes    Comment: occasional  24 pk of malt liqour a month  . Drug use: No  . Sexual activity: Not on file

## 2018-03-08 ENCOUNTER — Ambulatory Visit (INDEPENDENT_AMBULATORY_CARE_PROVIDER_SITE_OTHER): Payer: Medicaid Other | Admitting: Physician Assistant

## 2018-03-08 ENCOUNTER — Encounter (INDEPENDENT_AMBULATORY_CARE_PROVIDER_SITE_OTHER): Payer: Self-pay | Admitting: Orthopedic Surgery

## 2018-03-08 VITALS — Ht 69.0 in | Wt 265.0 lb

## 2018-03-08 DIAGNOSIS — L97421 Non-pressure chronic ulcer of left heel and midfoot limited to breakdown of skin: Secondary | ICD-10-CM

## 2018-03-08 DIAGNOSIS — I87323 Chronic venous hypertension (idiopathic) with inflammation of bilateral lower extremity: Secondary | ICD-10-CM | POA: Diagnosis not present

## 2018-03-08 DIAGNOSIS — E1142 Type 2 diabetes mellitus with diabetic polyneuropathy: Secondary | ICD-10-CM | POA: Diagnosis not present

## 2018-03-08 NOTE — Progress Notes (Signed)
Office Visit Note   Patient: [name]           Date of Birth: [birth year]           MRN: [id number] Visit Date: 03/08/2018              Requested by: Fleet ContrasAvbuere, Edwin, MD [address] [place], [place] [id number] PCP: Fleet ContrasAvbuere, Edwin, MD  Chief Complaint  Patient presents with  . Left Foot - Follow-up, Wound Check      HPI: Patient is a 57 year old male who is seen for follow-up for his left foot heel ulcer.  He is weight-bear as tolerated walking with a walker.  He is wearing compression stockings.  He is previously undergone a right third and fourth toe amputations on 01/07/2017 and is well-healed from these.  He reports some drainage from the left heel ulcer and he further reports that his primary care doctor had to decrease his fluid pills and he is having more swelling in the legs at times.  Assessment & Plan: Visit Diagnoses:  1. Ulcer of left heel, limited to breakdown of skin (HCC)   2. Diabetic polyneuropathy associated with type 2 diabetes mellitus (HCC)   3. Chronic venous hypertension (idiopathic) with inflammation of bilateral lower extremity     Plan: After informed consent the left heel ulcer was debrided sharply with a #10 blade and the patient tolerated this well.  We recommended he continue to offload the area as much as possible and walk with his walker.  Continue compression.  He will follow-up here in 4 weeks.  Follow-Up Instructions: Return in about 4 weeks (around 04/05/2018).   Ortho Exam  Patient is alert, oriented, no adenopathy, well-dressed, normal affect, normal respiratory effort. The left heel ulcer was debrided with a #10 blade including soft tissue and callus.  There is no visible bone or tendon.  The residual wound is 3 x 4 x 2 mm.  There is no sign of cellulitis.  There is pitting edema of the lower extremities.  Imaging: No results found. No images are attached to the encounter.  Labs: Lab Results  Component Value Date   HGBA1C 7.1  (H) 09/24/2016   HGBA1C 6.8 (H) 06/17/2014   HGBA1C (H) 11/26/2006    10.4 (NOTE)   The ADA recommends the following therapeutic goals for glycemic   control related to Hgb A1C measurement:   Goal of Therapy:   < 7.0% Hgb A1C   Action Suggested:  > 8.0% Hgb A1C   Ref:  Diabetes Care, 22, Suppl. 1, 1999   ESRSEDRATE 45 (H) 06/17/2014   CRP 1.5 (H) 06/17/2014   REPTSTATUS 06/19/2014 FINAL 06/17/2014   GRAMSTAIN  06/17/2014    RARE WBC PRESENT, PREDOMINANTLY PMN NO SQUAMOUS EPITHELIAL CELLS SEEN NO ORGANISMS SEEN Performed at [facility]    CULT  06/17/2014    MULTIPLE ORGANISMS PRESENT, NONE PREDOMINANT Note: NO STAPHYLOCOCCUS AUREUS ISOLATED NO GROUP A STREP (S.PYOGENES) ISOLATED Performed at [facility]    LABORGA STAPHYLOCOCCUS AUREUS 11/16/2007   LABORGA MORGANELLA MORGANII 11/16/2007     Lab Results  Component Value Date   ALBUMIN 3.4 (L) 07/27/2015   ALBUMIN 3.3 (L) 04/20/2015   ALBUMIN 3.6 06/17/2014    Body mass index is 39.13 kg/m.  Orders:  No orders of the defined types were placed in this encounter.  No orders of the defined types were placed in this encounter.    Procedures: No procedures performed  Clinical Data: No  additional findings.  ROS:  All other systems negative, except as noted in the HPI. Review of Systems  Objective: Vital Signs: Ht 5\' 9"  (1.753 m)   Wt 265 lb (120.2 kg)   BMI 39.13 kg/m   Specialty Comments:  No specialty comments available.  PMFS History: Patient Active Problem List   Diagnosis Date Noted  . Acute osteomyelitis of toe, right (HCC)   . Diabetic polyneuropathy associated with type 2 diabetes mellitus (HCC) 08/14/2016  . 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 Ulcer of left heel, limited to breakdown of skin (HCC) 05/15/2016  . Amputated toe of right foot (HCC) 07/27/2015  . Great toe pain 06/17/2014  . Type II diabetes mellitus  with peripheral circulatory disorder (HCC) 06/17/2014  . Elevated transaminase level 06/17/2014  . HTN (hypertension) 06/17/2014  . Visual disturbance    Past Medical History:  Diagnosis Date  . Blood transfusion 2009  . Diabetes mellitus    type 2  . Hyperlipemia   . Hypertension    denies  . Leg swelling   . Osteomyelitis (HCC)    right 5th toe  . Visual disturbance     Family History  Problem Relation Age of Onset  . Cancer Mother        breast    Past Surgical History:  Procedure Laterality Date  . AMPUTATION Left 2012   Left Great Toe  . AMPUTATION Right 04/20/2015   Procedure: Right Great Toe Amputation;  Surgeon: [name], MD;  Location: [facility] OR;  Service: Orthopedics;  Laterality: Right;  . AMPUTATION Right 07/27/2015   Procedure: Right 2nd Toe Amputation;  Surgeon: [name], MD;  Location: [facility] OR;  Service: Orthopedics;  Laterality: Right;  . AMPUTATION Right 09/24/2016   Procedure: Right 5th Toe Amputation;  Surgeon: [name], MD;  Location: [facility] OR;  Service: Orthopedics;  Laterality: Right;  . AMPUTATION Right 01/07/2017   Procedure: Right Foot 3rd and 4th Toe Amputation;  Surgeon: [name], MD;  Location: [facility] OR;  Service: Orthopedics;  Laterality: Right;  . CHOLECYSTECTOMY  03/2011  . EYE SURGERY     lazer  . foot surgery  11/2007   small toe on left foot removed.  Marland Kitchen NASAL SEPTUM SURGERY  1981   Due to broken nose. Surgery was to repair how bone healed.  Marland Kitchen PARS PLANA VITRECTOMY  11/17/2011   Procedure: PARS PLANA VITRECTOMY WITH 23 GAUGE;  Surgeon: Shade Flood, MD;  Location: [facility] OR;  Service: Ophthalmology;  Laterality: Right;  With Endolaser  . TOE AMPUTATION Left    5 th  . TONSILLECTOMY     Social History   Occupational History  . Not on file  Tobacco Use  . Smoking status: Former Smoker    Years: 5.00    Types: Cigarettes    Last attempt to quit: 03/25/1979    Years since quitting: 38.9  . Smokeless tobacco: Never Used  Substance  and Sexual Activity  . Alcohol use: Yes    Comment: occasional  24 pk of malt liqour a month  . Drug use: No  . Sexual activity: Not on file

## 2018-03-09 ENCOUNTER — Encounter (INDEPENDENT_AMBULATORY_CARE_PROVIDER_SITE_OTHER): Payer: Self-pay | Admitting: Physician Assistant

## 2018-04-08 ENCOUNTER — Ambulatory Visit (INDEPENDENT_AMBULATORY_CARE_PROVIDER_SITE_OTHER): Payer: Medicaid Other | Admitting: Physician Assistant

## 2018-04-08 ENCOUNTER — Encounter (INDEPENDENT_AMBULATORY_CARE_PROVIDER_SITE_OTHER): Payer: Self-pay | Admitting: Orthopedic Surgery

## 2018-04-08 VITALS — Ht 69.0 in | Wt 265.0 lb

## 2018-04-08 DIAGNOSIS — I87323 Chronic venous hypertension (idiopathic) with inflammation of bilateral lower extremity: Secondary | ICD-10-CM

## 2018-04-08 DIAGNOSIS — E1142 Type 2 diabetes mellitus with diabetic polyneuropathy: Secondary | ICD-10-CM

## 2018-04-08 DIAGNOSIS — L97421 Non-pressure chronic ulcer of left heel and midfoot limited to breakdown of skin: Secondary | ICD-10-CM

## 2018-04-09 ENCOUNTER — Encounter (INDEPENDENT_AMBULATORY_CARE_PROVIDER_SITE_OTHER): Payer: Self-pay | Admitting: Physician Assistant

## 2018-04-09 NOTE — Progress Notes (Signed)
Office Visit Note   Patient: [name]           Date of Birth: 1960-07-15           MRN: [id number] Visit Date: 04/08/2018              Requested by: Fleet Contras, MD [address] [place], [place] [address] PCP: Fleet Contras, MD  Chief Complaint  Patient presents with  . Left Foot - Follow-up    Small ulcer on heel      HPI: The patient is a 57 yo male who is here for follow up of his left foot/heel insensate diabetic ulcer. He obtained custom shoes with custom orthotics yesterday from [employer], but reports he has not been able to tolerate wearing them much yet. He is using compression stockings bilaterally.   Assessment & Plan: Visit Diagnoses:  1. Ulcer of left heel, limited to breakdown of skin (HCC)   2. Diabetic polyneuropathy associated with type 2 diabetes mellitus (HCC)   3. Chronic venous hypertension (idiopathic) with inflammation of bilateral lower extremity     Plan: After informed consent the left heel ulcer/callus was debrided with a #10 blade knife and the patient tolerated this well. He will follow up with [employer] for his custom shoes and orthotics. Continue compression stockings bilaterally. Follow up in 6 weeks or sooner if problems in the interim.   Follow-Up Instructions: Return in about 6 weeks (around 05/20/2018).   Ortho Exam  Patient is alert, oriented, no adenopathy, well-dressed, normal affect, normal respiratory effort. The patient has thick callus over the left heel and after informed consent the callus was debrided using a #10 blade knife and the patient tolerated this well. There was no open area noted following debridement. No signs of infection or cellulitis. Good pedal pulses.   Imaging: No results found. No images are attached to the encounter.  Labs: Lab Results  Component Value Date   HGBA1C 7.1 (H) 09/24/2016   HGBA1C 6.8 (H) 06/17/2014   HGBA1C (H) 11/26/2006    10.4 (NOTE)   The ADA recommends the following therapeutic  goals for glycemic   control related to Hgb A1C measurement:   Goal of Therapy:   < 7.0% Hgb A1C   Action Suggested:  > 8.0% Hgb A1C   Ref:  Diabetes Care, 22, Suppl. 1, 1999   ESRSEDRATE 45 (H) 06/17/2014   CRP 1.5 (H) 06/17/2014   REPTSTATUS 06/19/2014 FINAL 06/17/2014   GRAMSTAIN  06/17/2014    RARE WBC PRESENT, PREDOMINANTLY PMN NO SQUAMOUS EPITHELIAL CELLS SEEN NO ORGANISMS SEEN Performed at [facility]    CULT  06/17/2014    MULTIPLE ORGANISMS PRESENT, NONE PREDOMINANT Note: NO STAPHYLOCOCCUS AUREUS ISOLATED NO GROUP A STREP (S.PYOGENES) ISOLATED Performed at [facility]    LABORGA STAPHYLOCOCCUS AUREUS 11/16/2007   LABORGA MORGANELLA MORGANII 11/16/2007     Lab Results  Component Value Date   ALBUMIN 3.4 (L) 07/27/2015   ALBUMIN 3.3 (L) 04/20/2015   ALBUMIN 3.6 06/17/2014    Body mass index is 39.13 kg/m.  Orders:  No orders of the defined types were placed in this encounter.  No orders of the defined types were placed in this encounter.    Procedures: No procedures performed  Clinical Data: No additional findings.  ROS:  All other systems negative, except as noted in the HPI. Review of Systems  Objective: Vital Signs: Ht 5\' 9"  (1.753 m)   Wt 265 lb (120.2 kg)  BMI 39.13 kg/m   Specialty Comments:  No specialty comments available.  PMFS History: Patient Active Problem List   Diagnosis Date Noted  . Acute osteomyelitis of toe, right (HCC)   . Diabetic polyneuropathy associated with type 2 diabetes mellitus (HCC) 08/14/2016  . 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 Ulcer of left heel, limited to breakdown of skin (HCC) 05/15/2016  . Amputated toe of right foot (HCC) 07/27/2015  . Great toe pain 06/17/2014  . Type II diabetes mellitus with peripheral circulatory disorder (HCC) 06/17/2014  . Elevated transaminase level 06/17/2014  . HTN (hypertension) 06/17/2014  .  Visual disturbance    Past Medical History:  Diagnosis Date  . Blood transfusion 2009  . Diabetes mellitus    type 2  . Hyperlipemia   . Hypertension    denies  . Leg swelling   . Osteomyelitis (HCC)    right 5th toe  . Visual disturbance     Family History  Problem Relation Age of Onset  . Cancer Mother        breast    Past Surgical History:  Procedure Laterality Date  . AMPUTATION Left 2012   Left Great Toe  . AMPUTATION Right 04/20/2015   Procedure: Right Great Toe Amputation;  Surgeon: [name], MD;  Location: [facility] OR;  Service: Orthopedics;  Laterality: Right;  . AMPUTATION Right 07/27/2015   Procedure: Right 2nd Toe Amputation;  Surgeon: [name], MD;  Location: [facility] OR;  Service: Orthopedics;  Laterality: Right;  . AMPUTATION Right 09/24/2016   Procedure: Right 5th Toe Amputation;  Surgeon: [name], MD;  Location: [facility] OR;  Service: Orthopedics;  Laterality: Right;  . AMPUTATION Right 01/07/2017   Procedure: Right Foot 3rd and 4th Toe Amputation;  Surgeon: [name], MD;  Location: [facility] OR;  Service: Orthopedics;  Laterality: Right;  . CHOLECYSTECTOMY  03/2011  . EYE SURGERY     lazer  . foot surgery  11/2007   small toe on left foot removed.  Marland Kitchen NASAL SEPTUM SURGERY  1981   Due to broken nose. Surgery was to repair how bone healed.  Marland Kitchen PARS PLANA VITRECTOMY  11/17/2011   Procedure: PARS PLANA VITRECTOMY WITH 23 GAUGE;  Surgeon: Shade Flood, MD;  Location: [facility] OR;  Service: Ophthalmology;  Laterality: Right;  With Endolaser  . TOE AMPUTATION Left    5 th  . TONSILLECTOMY     Social History   Occupational History  . Not on file  Tobacco Use  . Smoking status: Former Smoker    Years: 5.00    Types: Cigarettes    Last attempt to quit: 03/25/1979    Years since quitting: 39.0  . Smokeless tobacco: Never Used  Substance and Sexual Activity  . Alcohol use: Yes    Comment: occasional  24 pk of malt liqour a month  . Drug use: No  . Sexual activity: Not  on file

## 2018-05-20 ENCOUNTER — Encounter (INDEPENDENT_AMBULATORY_CARE_PROVIDER_SITE_OTHER): Payer: Self-pay | Admitting: Physician Assistant

## 2018-05-20 ENCOUNTER — Ambulatory Visit (INDEPENDENT_AMBULATORY_CARE_PROVIDER_SITE_OTHER): Payer: Medicaid Other | Admitting: Orthopedic Surgery

## 2018-05-20 VITALS — Ht 69.0 in | Wt 265.0 lb

## 2018-05-20 DIAGNOSIS — E1142 Type 2 diabetes mellitus with diabetic polyneuropathy: Secondary | ICD-10-CM

## 2018-05-20 DIAGNOSIS — L97421 Non-pressure chronic ulcer of left heel and midfoot limited to breakdown of skin: Secondary | ICD-10-CM | POA: Diagnosis not present

## 2018-05-20 NOTE — Progress Notes (Signed)
Office Visit Note   Patient: [name]           Date of Birth: Jan 31, 1961           MRN: [id number] Visit Date: 05/20/2018              Requested by: Fleet ContrasAvbuere, Edwin, MD [address] [place], [place] [id number] PCP: Fleet ContrasAvbuere, Edwin, MD  Chief Complaint  Patient presents with  . Right Foot - Follow-up    3rd &4th Toe amp      HPI: Patient presents in follow-up status post new custom orthotics and extra-depth shoes from [employer].  These have been modified to unload pressure from his heel.  Patient has no concerns.  Assessment & Plan: Visit Diagnoses:  1. Ulcer of left heel, limited to breakdown of skin (HCC)   2. Diabetic polyneuropathy associated with type 2 diabetes mellitus (HCC)     Plan: He may increase his activities as tolerated discussed the importance of inspecting his feet on a routine basis and if he develops any areas pre-ulcerative breakdown to get off his feet.  Follow-Up Instructions: Return in about 4 weeks (around 06/17/2018).   Ortho Exam  Patient is alert, oriented, no adenopathy, well-dressed, normal affect, normal respiratory effort. Examination the ulcer on the plantar aspect the left foot is completely healed there is superficial callus which is too thin to trim.  Patient has essentially no heel pad and has essentially a thin layer of skin with bone there is no cellulitis no drainage no open wounds no signs of infection.  Patient has new orthotics with pressure cut out for the heel area.  Imaging: No results found.      Labs: Lab Results  Component Value Date   HGBA1C 7.1 (H) 09/24/2016   HGBA1C 6.8 (H) 06/17/2014   HGBA1C (H) 11/26/2006    10.4 (NOTE)   The ADA recommends the following therapeutic goals for glycemic   control related to Hgb A1C measurement:   Goal of Therapy:   < 7.0% Hgb A1C   Action Suggested:  > 8.0% Hgb A1C   Ref:  Diabetes Care, 22, Suppl. 1, 1999   ESRSEDRATE 45 (H) 06/17/2014   CRP 1.5 (H) 06/17/2014   REPTSTATUS  06/19/2014 FINAL 06/17/2014   GRAMSTAIN  06/17/2014    RARE WBC PRESENT, PREDOMINANTLY PMN NO SQUAMOUS EPITHELIAL CELLS SEEN NO ORGANISMS SEEN [facility]formed at [facility]    CULT  06/17/2014    MULTIPLE ORGANISMS PRESENT, NONE PREDOMINANT Note: NO STAPHYLOCOCCUS AUREUS ISOLATED NO GROUP A STREP (S.PYOGENES) ISOLATED Performed at [facility]    LABORGA STAPHYLOCOCCUS AUREUS 11/16/2007   LABORGA MORGANELLA MORGANII 11/16/2007     Lab Results  Component Value Date   ALBUMIN 3.4 (L) 07/27/2015   ALBUMIN 3.3 (L) 04/20/2015   ALBUMIN 3.6 06/17/2014    Body mass index is 39.13 kg/m.  Orders:  No orders of the defined types were placed in this encounter.  No orders of the defined types were placed in this encounter.    Procedures: No procedures performed  Clinical Data: No additional findings.  ROS:  All other systems negative, except as noted in the HPI. Review of Systems  Objective: Vital Signs: Ht 5\' 9"  (1.753 m)   Wt 265 lb (120.2 kg)   BMI 39.13 kg/m   Specialty Comments:  No specialty comments available.  PMFS History: Patient Active Problem List   Diagnosis Date Noted  . Acute osteomyelitis of toe, right (HCC)   .  Diabetic polyneuropathy associated with type 2 diabetes mellitus (HCC) 08/14/2016  . 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 Ulcer of left heel, limited to breakdown of skin (HCC) 05/15/2016  . Amputated toe of right foot (HCC) 07/27/2015  . Great toe pain 06/17/2014  . Type II diabetes mellitus with peripheral circulatory disorder (HCC) 06/17/2014  . Elevated transaminase level 06/17/2014  . HTN (hypertension) 06/17/2014  . Visual disturbance    Past Medical History:  Diagnosis Date  . Blood transfusion 2009  . Diabetes mellitus    type 2  . Hyperlipemia   . Hypertension    denies  . Leg swelling   . Osteomyelitis (HCC)    right 5th toe  . Visual  disturbance     Family History  Problem Relation Age of Onset  . Cancer Mother        breast    Past Surgical History:  Procedure Laterality Date  . AMPUTATION Left 2012   Left Great Toe  . AMPUTATION Right 04/20/2015   Procedure: Right Great Toe Amputation;  Surgeon: [name], MD;  Location: [facility] OR;  Service: Orthopedics;  Laterality: Right;  . AMPUTATION Right 07/27/2015   Procedure: Right 2nd Toe Amputation;  Surgeon: [name], MD;  Location: [facility] OR;  Service: Orthopedics;  Laterality: Right;  . AMPUTATION Right 09/24/2016   Procedure: Right 5th Toe Amputation;  Surgeon: [name], MD;  Location: [facility] OR;  Service: Orthopedics;  Laterality: Right;  . AMPUTATION Right 01/07/2017   Procedure: Right Foot 3rd and 4th Toe Amputation;  Surgeon: [name], MD;  Location: [facility] OR;  Service: Orthopedics;  Laterality: Right;  . CHOLECYSTECTOMY  03/2011  . EYE SURGERY     lazer  . foot surgery  11/2007   small toe on left foot removed.  Marland Kitchen NASAL SEPTUM SURGERY  1981   Due to broken nose. Surgery was to repair how bone healed.  Marland Kitchen PARS PLANA VITRECTOMY  11/17/2011   Procedure: PARS PLANA VITRECTOMY WITH 23 GAUGE;  Surgeon: Shade Flood, MD;  Location: [facility] OR;  Service: Ophthalmology;  Laterality: Right;  With Endolaser  . TOE AMPUTATION Left    5 th  . TONSILLECTOMY     Social History   Occupational History  . Not on file  Tobacco Use  . Smoking status: Former Smoker    Years: 5.00    Types: Cigarettes    Last attempt to quit: 03/25/1979    Years since quitting: 39.1  . Smokeless tobacco: Never Used  Substance and Sexual Activity  . Alcohol use: Yes    Comment: occasional  24 pk of malt liqour a month  . Drug use: No  . Sexual activity: Not on file

## 2018-06-17 ENCOUNTER — Ambulatory Visit (INDEPENDENT_AMBULATORY_CARE_PROVIDER_SITE_OTHER): Payer: Medicaid Other | Admitting: Physician Assistant

## 2018-06-24 ENCOUNTER — Encounter (INDEPENDENT_AMBULATORY_CARE_PROVIDER_SITE_OTHER): Payer: Self-pay | Admitting: Physician Assistant

## 2018-06-24 ENCOUNTER — Ambulatory Visit (INDEPENDENT_AMBULATORY_CARE_PROVIDER_SITE_OTHER): Payer: Medicaid Other | Admitting: Physician Assistant

## 2018-06-24 VITALS — Ht 69.0 in | Wt 265.0 lb

## 2018-06-24 DIAGNOSIS — E1142 Type 2 diabetes mellitus with diabetic polyneuropathy: Secondary | ICD-10-CM | POA: Diagnosis not present

## 2018-06-24 DIAGNOSIS — I87323 Chronic venous hypertension (idiopathic) with inflammation of bilateral lower extremity: Secondary | ICD-10-CM | POA: Diagnosis not present

## 2018-06-24 DIAGNOSIS — L97421 Non-pressure chronic ulcer of left heel and midfoot limited to breakdown of skin: Secondary | ICD-10-CM | POA: Diagnosis not present

## 2018-06-24 NOTE — Progress Notes (Signed)
Office Visit Note   Patient: [name]           Date of Birth: 18-Jan-1961           MRN: [id number] Visit Date: 06/24/2018              Requested by: Fleet Contras, MD [address] [place], [place] [address] PCP: Fleet Contras, MD  Chief Complaint  Patient presents with  . Left Foot - Follow-up    Callus removal      HPI: The patient is a 58 yo gentleman who is seen for follow up of left heel callus. He is also S/P Right 3rd and 4th toe amputation and reports no problems with the right foot today. He is wearing his old shoes and inserts as he reports his new shoes hurt and rub pressure areas over the left lateral foot and he was encouraged to follow up with [facility] for adjustments.   Assessment & Plan: Visit Diagnoses:  1. Ulcer of left heel, limited to breakdown of skin (HCC)   2. Diabetic polyneuropathy associated with type 2 diabetes mellitus (HCC)   3. Chronic venous hypertension (idiopathic) with inflammation of bilateral lower extremity     Plan: After informed consent, the callus over the base of the left 5th MT and over the heel were debrided to healthy tissue and the patient tolerated this well. He will continue daily foot care and monitoring for callus and ulcer formation. Follow up in 8 weeks or sooner if difficulties in the interim.   Follow-Up Instructions: Return in about 8 weeks (around 08/19/2018).   Ortho Exam  Patient is alert, oriented, no adenopathy, well-dressed, normal affect, normal respiratory effort. After informed consent the left heel and 5th MT base calluses were debrided with a #10 blade knife to healthy appearing skin and the patient tolerated this well. He has no open ulcers or other areas of breakdown of the left foot. Good pedal pulses. No signs of infection or cellulitis.   Imaging: No results found. No images are attached to the encounter.  Labs: Lab Results  Component Value Date   HGBA1C 7.1 (H) 09/24/2016   HGBA1C 6.8  (H) 06/17/2014   HGBA1C (H) 11/26/2006    10.4 (NOTE)   The ADA recommends the following therapeutic goals for glycemic   control related to Hgb A1C measurement:   Goal of Therapy:   < 7.0% Hgb A1C   Action Suggested:  > 8.0% Hgb A1C   Ref:  Diabetes Care, 22, Suppl. 1, 1999   ESRSEDRATE 45 (H) 06/17/2014   CRP 1.5 (H) 06/17/2014   REPTSTATUS 06/19/2014 FINAL 06/17/2014   GRAMSTAIN  06/17/2014    RARE WBC PRESENT, PREDOMINANTLY PMN NO SQUAMOUS EPITHELIAL CELLS SEEN NO ORGANISMS SEEN Performed at [facility]    CULT  06/17/2014    MULTIPLE ORGANISMS PRESENT, NONE PREDOMINANT Note: NO STAPHYLOCOCCUS AUREUS ISOLATED NO GROUP A STREP (S.PYOGENES) ISOLATED Performed at [facility]    LABORGA STAPHYLOCOCCUS AUREUS 11/16/2007   LABORGA MORGANELLA MORGANII 11/16/2007     Lab Results  Component Value Date   ALBUMIN 3.4 (L) 07/27/2015   ALBUMIN 3.3 (L) 04/20/2015   ALBUMIN 3.6 06/17/2014    Body mass index is 39.13 kg/m.  Orders:  No orders of the defined types were placed in this encounter.  No orders of the defined types were placed in this encounter.    Procedures: No procedures performed  Clinical Data: No additional findings.  ROS:  All other systems negative, except as noted in the HPI. Review of Systems  Objective: Vital Signs: Ht 5\' 9"  (1.753 m)   Wt 265 lb (120.2 kg)   BMI 39.13 kg/m   Specialty Comments:  No specialty comments available.  PMFS History: Patient Active Problem List   Diagnosis Date Noted  . Acute osteomyelitis of toe, right (HCC)   . Diabetic polyneuropathy associated with type 2 diabetes mellitus (HCC) 08/14/2016  . 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 Ulcer of left heel, limited to breakdown of skin (HCC) 05/15/2016  . Amputated toe of right foot (HCC) 07/27/2015  . Great toe pain 06/17/2014  . Type II diabetes mellitus with peripheral circulatory  disorder (HCC) 06/17/2014  . Elevated transaminase level 06/17/2014  . HTN (hypertension) 06/17/2014  . Visual disturbance    Past Medical History:  Diagnosis Date  . Blood transfusion 2009  . Diabetes mellitus    type 2  . Hyperlipemia   . Hypertension    denies  . Leg swelling   . Osteomyelitis (HCC)    right 5th toe  . Visual disturbance     Family History  Problem Relation Age of Onset  . Cancer Mother        breast    Past Surgical History:  Procedure Laterality Date  . AMPUTATION Left 2012   Left Great Toe  . AMPUTATION Right 04/20/2015   Procedure: Right Great Toe Amputation;  Surgeon: [name], MD;  Location: [facility] OR;  Service: Orthopedics;  Laterality: Right;  . AMPUTATION Right 07/27/2015   Procedure: Right 2nd Toe Amputation;  Surgeon: [name], MD;  Location: [facility] OR;  Service: Orthopedics;  Laterality: Right;  . AMPUTATION Right 09/24/2016   Procedure: Right 5th Toe Amputation;  Surgeon: [name], MD;  Location: [facility] OR;  Service: Orthopedics;  Laterality: Right;  . AMPUTATION Right 01/07/2017   Procedure: Right Foot 3rd and 4th Toe Amputation;  Surgeon: [name], MD;  Location: [facility] OR;  Service: Orthopedics;  Laterality: Right;  . CHOLECYSTECTOMY  03/2011  . EYE SURGERY     lazer  . foot surgery  11/2007   small toe on left foot removed.  Marland Kitchen NASAL SEPTUM SURGERY  1981   Due to broken nose. Surgery was to repair how bone healed.  Marland Kitchen PARS PLANA VITRECTOMY  11/17/2011   Procedure: PARS PLANA VITRECTOMY WITH 23 GAUGE;  Surgeon: Shade Flood, MD;  Location: [facility] OR;  Service: Ophthalmology;  Laterality: Right;  With Endolaser  . TOE AMPUTATION Left    5 th  . TONSILLECTOMY     Social History   Occupational History  . Not on file  Tobacco Use  . Smoking status: Former Smoker    Years: 5.00    Types: Cigarettes    Last attempt to quit: 03/25/1979    Years since quitting: 39.2  . Smokeless tobacco: Never Used  Substance and Sexual Activity  .  Alcohol use: Yes    Comment: occasional  24 pk of malt liqour a month  . Drug use: No  . Sexual activity: Not on file

## 2018-08-23 ENCOUNTER — Encounter (INDEPENDENT_AMBULATORY_CARE_PROVIDER_SITE_OTHER): Payer: Self-pay | Admitting: Orthopedic Surgery

## 2018-08-23 ENCOUNTER — Ambulatory Visit (INDEPENDENT_AMBULATORY_CARE_PROVIDER_SITE_OTHER): Payer: Medicaid Other | Admitting: Orthopedic Surgery

## 2018-08-23 VITALS — Ht 69.0 in | Wt 265.0 lb

## 2018-08-23 DIAGNOSIS — L97421 Non-pressure chronic ulcer of left heel and midfoot limited to breakdown of skin: Secondary | ICD-10-CM

## 2018-08-23 NOTE — Progress Notes (Signed)
Office Visit Note   Patient: [name]           Date of Birth: 15-May-1961           MRN: [id number] Visit Date: 08/23/2018              Requested by: Fleet Contras, MD [address] [place], [place] [address] PCP: Fleet Contras, MD  Chief Complaint  Patient presents with  . Left Foot - Follow-up      HPI: Patient is a 58 year old gentleman who presents in follow-up for insensate neuropathic ulcer left heel.  Patient had new orthotics he cut out a hole in the heel of his orthotic to try to unload the pressure from his heel.  Assessment & Plan: Visit Diagnoses:  1. Ulcer of left heel, limited to breakdown of skin (HCC)     Plan: Recommended continue daily dressing changes minimize weightbearing on the left heel reevaluate in 4 weeks.  Follow-Up Instructions: Return in about 4 weeks (around 09/20/2018).   Ortho Exam  Patient is alert, oriented, no adenopathy, well-dressed, normal affect, normal respiratory effort. Examination patient has a good dorsalis pedis pulse he does have brawny skin color changes but no venous ulcers he has callus with ulceration over the left heel.  After informed consent a 10 blade knife was used to debride the skin and soft tissue back to healthy viable granulation tissue there is no exposed bone or tendon the ulcer is 10 mm in diameter 1 mm deep.  There was a small amount of fluid but no purulence no signs of infection.  Iodosorb 4 x 4 and paper tape was applied.  Imaging: No results found. No images are attached to the encounter.  Labs: Lab Results  Component Value Date   HGBA1C 7.1 (H) 09/24/2016   HGBA1C 6.8 (H) 06/17/2014   HGBA1C (H) 11/26/2006    10.4 (NOTE)   The ADA recommends the following therapeutic goals for glycemic   control related to Hgb A1C measurement:   Goal of Therapy:   < 7.0% Hgb A1C   Action Suggested:  > 8.0% Hgb A1C   Ref:  Diabetes Care, 22, Suppl. 1, 1999   ESRSEDRATE 45 (H) 06/17/2014   CRP 1.5 (H) 06/17/2014    REPTSTATUS 06/19/2014 FINAL 06/17/2014   GRAMSTAIN  06/17/2014    RARE WBC PRESENT, PREDOMINANTLY PMN NO SQUAMOUS EPITHELIAL CELLS SEEN NO ORGANISMS SEEN Performed at [facility]    CULT  06/17/2014    MULTIPLE ORGANISMS PRESENT, NONE PREDOMINANT Note: NO STAPHYLOCOCCUS AUREUS ISOLATED NO GROUP A STREP (S.PYOGENES) ISOLATED Performed at [facility]    LABORGA STAPHYLOCOCCUS AUREUS 11/16/2007   LABORGA MORGANELLA MORGANII 11/16/2007     Lab Results  Component Value Date   ALBUMIN 3.4 (L) 07/27/2015   ALBUMIN 3.3 (L) 04/20/2015   ALBUMIN 3.6 06/17/2014    Body mass index is 39.13 kg/m.  Orders:  No orders of the defined types were placed in this encounter.  No orders of the defined types were placed in this encounter.    Procedures: No procedures performed  Clinical Data: No additional findings.  ROS:  All other systems negative, except as noted in the HPI. Review of Systems  Objective: Vital Signs: Ht 5\' 9"  (1.753 m)   Wt 265 lb (120.2 kg)   BMI 39.13 kg/m   Specialty Comments:  No specialty comments available.  PMFS History: Patient Active Problem List   Diagnosis Date Noted  . Acute osteomyelitis of  toe, right (HCC)   . Diabetic polyneuropathy associated with type 2 diabetes mellitus (HCC) 08/14/2016  . 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 Ulcer of left heel, limited to breakdown of skin (HCC) 05/15/2016  . Amputated toe of right foot (HCC) 07/27/2015  . Great toe pain 06/17/2014  . Type II diabetes mellitus with peripheral circulatory disorder (HCC) 06/17/2014  . Elevated transaminase level 06/17/2014  . HTN (hypertension) 06/17/2014  . Visual disturbance    Past Medical History:  Diagnosis Date  . Blood transfusion 2009  . Diabetes mellitus    type 2  . Hyperlipemia   . Hypertension    denies  . Leg swelling   . Osteomyelitis (HCC)    right 5th toe  .  Visual disturbance     Family History  Problem Relation Age of Onset  . Cancer Mother        breast    Past Surgical History:  Procedure Laterality Date  . AMPUTATION Left 2012   Left Great Toe  . AMPUTATION Right 04/20/2015   Procedure: Right Great Toe Amputation;  Surgeon: [name], MD;  Location: [facility] OR;  Service: Orthopedics;  Laterality: Right;  . AMPUTATION Right 07/27/2015   Procedure: Right 2nd Toe Amputation;  Surgeon: [name], MD;  Location: [facility] OR;  Service: Orthopedics;  Laterality: Right;  . AMPUTATION Right 09/24/2016   Procedure: Right 5th Toe Amputation;  Surgeon: [name], MD;  Location: [facility] OR;  Service: Orthopedics;  Laterality: Right;  . AMPUTATION Right 01/07/2017   Procedure: Right Foot 3rd and 4th Toe Amputation;  Surgeon: [name], MD;  Location: [facility] OR;  Service: Orthopedics;  Laterality: Right;  . CHOLECYSTECTOMY  03/2011  . EYE SURGERY     lazer  . foot surgery  11/2007   small toe on left foot removed.  Marland Kitchen NASAL SEPTUM SURGERY  1981   Due to broken nose. Surgery was to repair how bone healed.  Marland Kitchen PARS PLANA VITRECTOMY  11/17/2011   Procedure: PARS PLANA VITRECTOMY WITH 23 GAUGE;  Surgeon: Shade Flood, MD;  Location: [facility] OR;  Service: Ophthalmology;  Laterality: Right;  With Endolaser  . TOE AMPUTATION Left    5 th  . TONSILLECTOMY     Social History   Occupational History  . Not on file  Tobacco Use  . Smoking status: Former Smoker    Years: 5.00    Types: Cigarettes    Last attempt to quit: 03/25/1979    Years since quitting: 39.4  . Smokeless tobacco: Never Used  Substance and Sexual Activity  . Alcohol use: Yes    Comment: occasional  24 pk of malt liqour a month  . Drug use: No  . Sexual activity: Not on file

## 2018-09-22 ENCOUNTER — Telehealth (INDEPENDENT_AMBULATORY_CARE_PROVIDER_SITE_OTHER): Payer: Self-pay

## 2018-09-22 NOTE — Telephone Encounter (Signed)
Called and LM ON VM for pt to advise call back so that prescreen questions can be asked prior to tomorrows appt.

## 2018-09-22 NOTE — Telephone Encounter (Signed)
Patient called back and answered "NO" to all prescreening questions.  Thank you °

## 2018-09-23 ENCOUNTER — Other Ambulatory Visit: Payer: Self-pay

## 2018-09-23 ENCOUNTER — Ambulatory Visit (INDEPENDENT_AMBULATORY_CARE_PROVIDER_SITE_OTHER): Payer: Medicaid Other | Admitting: Physician Assistant

## 2018-09-23 ENCOUNTER — Encounter (INDEPENDENT_AMBULATORY_CARE_PROVIDER_SITE_OTHER): Payer: Self-pay | Admitting: Orthopedic Surgery

## 2018-09-23 VITALS — Ht 69.0 in | Wt 265.0 lb

## 2018-09-23 DIAGNOSIS — L97421 Non-pressure chronic ulcer of left heel and midfoot limited to breakdown of skin: Secondary | ICD-10-CM | POA: Diagnosis not present

## 2018-09-23 DIAGNOSIS — I87323 Chronic venous hypertension (idiopathic) with inflammation of bilateral lower extremity: Secondary | ICD-10-CM

## 2018-09-23 DIAGNOSIS — E1142 Type 2 diabetes mellitus with diabetic polyneuropathy: Secondary | ICD-10-CM | POA: Diagnosis not present

## 2018-09-23 NOTE — Progress Notes (Signed)
Office Visit Note   Patient: [name]           Date of Birth: 1960-08-28           MRN: [id number] Visit Date: 09/23/2018              Requested by: Fleet ContrasAvbuere, Edwin, MD [address] [place], [place] [id number] PCP: Fleet ContrasAvbuere, Edwin, MD  Chief Complaint  Patient presents with  . Right Foot - Follow-up  . Left Foot - Follow-up      HPI: The patient is a 58 year old gentleman who is seen for follow-up of his left heel ulcer.  He previously underwent right foot third and fourth toe amputations back in July 2018.  He reports that the left foot is doing much better.  He is very pleased with how the foot is doing.  He has not had any draining for a long period from the left heel and reports the foot is only dry.  Assessment & Plan: Visit Diagnoses:  1. Ulcer of left heel, limited to breakdown of skin (HCC)   2. Diabetic polyneuropathy associated with type 2 diabetes mellitus (HCC)   3. Chronic venous hypertension (idiopathic) with inflammation of bilateral lower extremity     Plan: The patient will continue to monitor bilateral feet closely and follow up prn for concerns. Advised to use Amlactin lotion for dry skin over the feet.   Follow-Up Instructions: Return in about 3 months (around 12/23/2018), or if symptoms worsen or fail to improve.   Ortho Exam  Patient is alert, oriented, no adenopathy, well-dressed, normal affect, normal respiratory effort. The left heel ulcer has resolved. He has good pedal pulses. There is mild callus formation over the mid foot and this was debrided with a #10 blade knife after informed consent and the patient tolerated this well.  He has no other areas of breakdown. He has no signs of infection or cellulitis.   Imaging: No results found. No images are attached to the encounter.  Labs: Lab Results  Component Value Date   HGBA1C 7.1 (H) 09/24/2016   HGBA1C 6.8 (H) 06/17/2014   HGBA1C (H) 11/26/2006    10.4 (NOTE)   The ADA recommends the  following therapeutic goals for glycemic   control related to Hgb A1C measurement:   Goal of Therapy:   < 7.0% Hgb A1C   Action Suggested:  > 8.0% Hgb A1C   Ref:  Diabetes Care, 22, Suppl. 1, 1999   ESRSEDRATE 45 (H) 06/17/2014   CRP 1.5 (H) 06/17/2014   REPTSTATUS 06/19/2014 FINAL 06/17/2014   GRAMSTAIN  06/17/2014    RARE WBC PRESENT, PREDOMINANTLY PMN NO SQUAMOUS EPITHELIAL CELLS SEEN NO ORGANISMS SEEN Performed at [facility]    CULT  06/17/2014    MULTIPLE ORGANISMS PRESENT, NONE PREDOMINANT Note: NO STAPHYLOCOCCUS AUREUS ISOLATED NO GROUP A STREP (S.PYOGENES) ISOLATED Performed at [facility]    LABORGA STAPHYLOCOCCUS AUREUS 11/16/2007   LABORGA MORGANELLA MORGANII 11/16/2007     Lab Results  Component Value Date   ALBUMIN 3.4 (L) 07/27/2015   ALBUMIN 3.3 (L) 04/20/2015   ALBUMIN 3.6 06/17/2014    Body mass index is 39.13 kg/m.  Orders:  No orders of the defined types were placed in this encounter.  No orders of the defined types were placed in this encounter.    Procedures: No procedures performed  Clinical Data: No additional findings.  ROS:  All other systems negative, except as noted in the HPI. Review of  Systems  Objective: Vital Signs: Ht 5\' 9"  (1.753 m)   Wt 265 lb (120.2 kg)   BMI 39.13 kg/m   Specialty Comments:  No specialty comments available.  PMFS History: Patient Active Problem List   Diagnosis Date Noted  . Acute osteomyelitis of toe, right (HCC)   . Diabetic polyneuropathy associated with type 2 diabetes mellitus (HCC) 08/14/2016  . 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 Ulcer of left heel, limited to breakdown of skin (HCC) 05/15/2016  . Amputated toe of right foot (HCC) 07/27/2015  . Great toe pain 06/17/2014  . Type II diabetes mellitus with peripheral circulatory disorder (HCC) 06/17/2014  . Elevated transaminase level 06/17/2014  . HTN  (hypertension) 06/17/2014  . Visual disturbance    Past Medical History:  Diagnosis Date  . Blood transfusion 2009  . Diabetes mellitus    type 2  . Hyperlipemia   . Hypertension    denies  . Leg swelling   . Osteomyelitis (HCC)    right 5th toe  . Visual disturbance     Family History  Problem Relation Age of Onset  . Cancer Mother        breast    Past Surgical History:  Procedure Laterality Date  . AMPUTATION Left 2012   Left Great Toe  . AMPUTATION Right 04/20/2015   Procedure: Right Great Toe Amputation;  Surgeon: [name], MD;  Location: [facility] OR;  Service: Orthopedics;  Laterality: Right;  . AMPUTATION Right 07/27/2015   Procedure: Right 2nd Toe Amputation;  Surgeon: [name], MD;  Location: [facility] OR;  Service: Orthopedics;  Laterality: Right;  . AMPUTATION Right 09/24/2016   Procedure: Right 5th Toe Amputation;  Surgeon: [name], MD;  Location: [facility] OR;  Service: Orthopedics;  Laterality: Right;  . AMPUTATION Right 01/07/2017   Procedure: Right Foot 3rd and 4th Toe Amputation;  Surgeon: [name], MD;  Location: [facility] OR;  Service: Orthopedics;  Laterality: Right;  . CHOLECYSTECTOMY  03/2011  . EYE SURGERY     lazer  . foot surgery  11/2007   small toe on left foot removed.  Marland Kitchen NASAL SEPTUM SURGERY  1981   Due to broken nose. Surgery was to repair how bone healed.  Marland Kitchen PARS PLANA VITRECTOMY  11/17/2011   Procedure: PARS PLANA VITRECTOMY WITH 23 GAUGE;  Surgeon: Shade Flood, MD;  Location: [facility] OR;  Service: Ophthalmology;  Laterality: Right;  With Endolaser  . TOE AMPUTATION Left    5 th  . TONSILLECTOMY     Social History   Occupational History  . Not on file  Tobacco Use  . Smoking status: Former Smoker    Years: 5.00    Types: Cigarettes    Last attempt to quit: 03/25/1979    Years since quitting: 39.5  . Smokeless tobacco: Never Used  Substance and Sexual Activity  . Alcohol use: Yes    Comment: occasional  24 pk of malt liqour a month  . Drug  use: No  . Sexual activity: Not on file

## 2018-11-12 ENCOUNTER — Ambulatory Visit: Payer: Medicaid Other | Admitting: Physician Assistant

## 2018-11-17 ENCOUNTER — Other Ambulatory Visit: Payer: Self-pay

## 2018-11-17 ENCOUNTER — Encounter: Payer: Self-pay | Admitting: Family

## 2018-11-17 ENCOUNTER — Ambulatory Visit: Payer: Medicaid Other | Admitting: Family

## 2018-11-17 VITALS — Ht 69.0 in | Wt 265.0 lb

## 2018-11-17 DIAGNOSIS — I87323 Chronic venous hypertension (idiopathic) with inflammation of bilateral lower extremity: Secondary | ICD-10-CM | POA: Diagnosis not present

## 2018-11-17 DIAGNOSIS — L97521 Non-pressure chronic ulcer of other part of left foot limited to breakdown of skin: Secondary | ICD-10-CM

## 2018-11-17 NOTE — Progress Notes (Signed)
Office Visit Note   Patient: [name]           Date of Birth: Feb 09, 1961           MRN: [id number] Visit Date: 11/17/2018              Requested by: Fleet Contras, MD [address] [place], [place] [address] PCP: Fleet Contras, MD  Chief Complaint  Patient presents with  . Right Foot - Follow-up  . Left Foot - Follow-up      HPI: Comfortable well the patient a 58 year old gentleman who presents today for bilateral evaluation.  He is concerned about an area to his left foot that was swollen last week states he was able to express some clear fluid out of this near his great toe amputation site on the left.  He is currently wearing his compression socks daily.  He is concerned that his compression socks are causing more swelling.  Also would like me to check the callus on the plantar aspect of his feet.  Extra-depth shoes with custom orthotics is quite pleased with these it has been quite sometime since he last had issues with ulcers on his feet.  Assessment & Plan: Visit Diagnoses:  1. Chronic venous hypertension (idiopathic) with inflammation of bilateral lower extremity   2. Skin ulcer of foot, left, limited to breakdown of skin (HCC)     Plan: Continue with close monitoring and conservative measures.  There is no concerning signs for infection.  He will continue with his protective shoe wear and follow-up in the office as needed.  Follow-Up Instructions: Return if symptoms worsen or fail to improve.   Ortho Exam  Patient is alert, oriented, no adenopathy, well-dressed, normal affect, normal respiratory effort. On examination of the bilateral lower extremities there is trace edema to the lower extremities the left foot has an area of thin skin over the great toe amputation site there appears to have been a blister this on the left has no fluid remaining there is no erythema no drainage no odor no sign of infection.  There is no open areas to the plantar aspect of his  foot.  There is thin callus no open ulcers his orthotics seem to be offloading well.  No open ulcers to the right foot no erythema no drainage no concerning sign.  Imaging: No results found. No images are attached to the encounter.  Labs: Lab Results  Component Value Date   HGBA1C 7.1 (H) 09/24/2016   HGBA1C 6.8 (H) 06/17/2014   HGBA1C (H) 11/26/2006    10.4 (NOTE)   The ADA recommends the following therapeutic goals for glycemic   control related to Hgb A1C measurement:   Goal of Therapy:   < 7.0% Hgb A1C   Action Suggested:  > 8.0% Hgb A1C   Ref:  Diabetes Care, 22, Suppl. 1, 1999   ESRSEDRATE 45 (H) 06/17/2014   CRP 1.5 (H) 06/17/2014   REPTSTATUS 06/19/2014 FINAL 06/17/2014   GRAMSTAIN  06/17/2014    RARE WBC PRESENT, PREDOMINANTLY PMN NO SQUAMOUS EPITHELIAL CELLS SEEN NO ORGANISMS SEEN Performed at [facility]    CULT  06/17/2014    MULTIPLE ORGANISMS PRESENT, NONE PREDOMINANT Note: NO STAPHYLOCOCCUS AUREUS ISOLATED NO GROUP A STREP (S.PYOGENES) ISOLATED Performed at [facility]    LABORGA STAPHYLOCOCCUS AUREUS 11/16/2007   LABORGA MORGANELLA MORGANII 11/16/2007     Lab Results  Component Value Date   ALBUMIN 3.4 (L) 07/27/2015   ALBUMIN 3.3 (L)  04/20/2015   ALBUMIN 3.6 06/17/2014    Body mass index is 39.13 kg/m.  Orders:  No orders of the defined types were placed in this encounter.  No orders of the defined types were placed in this encounter.    Procedures: No procedures performed  Clinical Data: No additional findings.  ROS:  All other systems negative, except as noted in the HPI. Review of Systems  Constitutional: Negative for chills and fever.  Skin: Negative for color change and wound.    Objective: Vital Signs: Ht 5\' 9"  (1.753 m)   Wt 265 lb (120.2 kg)   BMI 39.13 kg/m   Specialty Comments:  No specialty comments available.  PMFS History: Patient Active Problem List   Diagnosis Date Noted  . Acute osteomyelitis  of toe, right (HCC)   . Diabetic polyneuropathy associated with type 2 diabetes mellitus (HCC) 08/14/2016  . 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 Ulcer of left heel, limited to breakdown of skin (HCC) 05/15/2016  . Amputated toe of right foot (HCC) 07/27/2015  . Great toe pain 06/17/2014  . Type II diabetes mellitus with peripheral circulatory disorder (HCC) 06/17/2014  . Elevated transaminase level 06/17/2014  . HTN (hypertension) 06/17/2014  . Visual disturbance    Past Medical History:  Diagnosis Date  . Blood transfusion 2009  . Diabetes mellitus    type 2  . Hyperlipemia   . Hypertension    denies  . Leg swelling   . Osteomyelitis (HCC)    right 5th toe  . Visual disturbance     Family History  Problem Relation Age of Onset  . Cancer Mother        breast    Past Surgical History:  Procedure Laterality Date  . AMPUTATION Left 2012   Left Great Toe  . AMPUTATION Right 04/20/2015   Procedure: Right Great Toe Amputation;  Surgeon: [name], MD;  Location: [facility] OR;  Service: Orthopedics;  Laterality: Right;  . AMPUTATION Right 07/27/2015   Procedure: Right 2nd Toe Amputation;  Surgeon: [name], MD;  Location: [facility] OR;  Service: Orthopedics;  Laterality: Right;  . AMPUTATION Right 09/24/2016   Procedure: Right 5th Toe Amputation;  Surgeon: [name], MD;  Location: [facility] OR;  Service: Orthopedics;  Laterality: Right;  . AMPUTATION Right 01/07/2017   Procedure: Right Foot 3rd and 4th Toe Amputation;  Surgeon: [name], MD;  Location: [facility] OR;  Service: Orthopedics;  Laterality: Right;  . CHOLECYSTECTOMY  03/2011  . EYE SURGERY     lazer  . foot surgery  11/2007   small toe on left foot removed.  Marland Kitchen NASAL SEPTUM SURGERY  1981   Due to broken nose. Surgery was to repair how bone healed.  Marland Kitchen PARS PLANA VITRECTOMY  11/17/2011   Procedure: PARS PLANA VITRECTOMY WITH 23 GAUGE;  Surgeon: Shade Flood, MD;   Location: [facility] OR;  Service: Ophthalmology;  Laterality: Right;  With Endolaser  . TOE AMPUTATION Left    5 th  . TONSILLECTOMY     Social History   Occupational History  . Not on file  Tobacco Use  . Smoking status: Former Smoker    Years: 5.00    Types: Cigarettes    Last attempt to quit: 03/25/1979    Years since quitting: 39.6  . Smokeless tobacco: Never Used  Substance and Sexual Activity  . Alcohol use: Yes    Comment: occasional  24 pk  of malt liqour a month  . Drug use: No  . Sexual activity: Not on file

## 2020-08-13 ENCOUNTER — Ambulatory Visit (INDEPENDENT_AMBULATORY_CARE_PROVIDER_SITE_OTHER): Payer: Medicaid Other | Admitting: Orthopedic Surgery

## 2020-08-13 ENCOUNTER — Encounter: Payer: Self-pay | Admitting: Orthopedic Surgery

## 2020-08-13 DIAGNOSIS — L97521 Non-pressure chronic ulcer of other part of left foot limited to breakdown of skin: Secondary | ICD-10-CM

## 2020-08-13 DIAGNOSIS — I87323 Chronic venous hypertension (idiopathic) with inflammation of bilateral lower extremity: Secondary | ICD-10-CM

## 2020-08-13 NOTE — Progress Notes (Signed)
Office Visit Note   Patient: [name]           Date of Birth: 03-08-1961           MRN: [id number] Visit Date: 08/13/2020              Requested by: Fleet Contras, MD [address] [place],  [place] [address] PCP: Fleet Contras, MD  Chief Complaint  Patient presents with  . Left Foot - Pain      HPI: Patient is a 60 year old gentleman who presents with a new ulcer on the left heel.  Assessment & Plan: Visit Diagnoses:  1. Chronic venous hypertension (idiopathic) with inflammation of bilateral lower extremity   2. Skin ulcer of foot, left, limited to breakdown of skin (HCC)     Plan: Ulcer was debrided of skin and soft tissue he has Silvadene and will use Silvadene and dry dressing change daily plus his compression stocking.  Follow-Up Instructions: Return in about 3 weeks (around 09/03/2020).   Ortho Exam  Patient is alert, oriented, no adenopathy, well-dressed, normal affect, normal respiratory effort. Examination patient has a new ulcer on the plantar aspect of the left foot.  After informed consent a 10 blade knife was used to debride the skin and soft tissue back to healthy viable granulation tissue this was touched with silver nitrate the wound is 4 cm in diameter after debridement and 3 mm deep.  The ulcer was 1 cm in diameter prior to debridement.  There is no ascending cellulitis no odor there is some clear drainage.  Imaging: No results found. No images are attached to the encounter.  Labs: Lab Results  Component Value Date   HGBA1C 7.1 (H) 09/24/2016   HGBA1C 6.8 (H) 06/17/2014   HGBA1C (H) 11/26/2006    10.4 (NOTE)   The ADA recommends the following therapeutic goals for glycemic   control related to Hgb A1C measurement:   Goal of Therapy:   < 7.0% Hgb A1C   Action Suggested:  > 8.0% Hgb A1C   Ref:  Diabetes Care, 22, Suppl. 1, 1999   ESRSEDRATE 45 (H) 06/17/2014   CRP 1.5 (H) 06/17/2014   REPTSTATUS 06/19/2014 FINAL 06/17/2014   GRAMSTAIN   06/17/2014    RARE WBC PRESENT, PREDOMINANTLY PMN NO SQUAMOUS EPITHELIAL CELLS SEEN NO ORGANISMS SEEN Performed at [facility]    CULT  06/17/2014    MULTIPLE ORGANISMS PRESENT, NONE PREDOMINANT Note: NO STAPHYLOCOCCUS AUREUS ISOLATED NO GROUP A STREP (S.PYOGENES) ISOLATED Performed at [facility]    LABORGA STAPHYLOCOCCUS AUREUS 11/16/2007   LABORGA MORGANELLA MORGANII 11/16/2007     Lab Results  Component Value Date   ALBUMIN 3.4 (L) 07/27/2015   ALBUMIN 3.3 (L) 04/20/2015   ALBUMIN 3.6 06/17/2014    Lab Results  Component Value Date   MG 1.8 11/26/2007   No results found for: VD25OH  No results found for: PREALBUMIN CBC EXTENDED Latest Ref Rng & Units 01/07/2017 09/24/2016 07/27/2015  WBC 4.0 - 10.5 K/uL 9.9 8.5 8.9  RBC 4.22 - 5.81 MIL/uL 5.07 4.89 5.08  HGB 13.0 - 17.0 g/dL 12.9(L) 12.5(L) 12.7(L)  HCT 39.0 - 52.0 % 40.6 39.8 41.1  PLT 150 - 400 K/uL 231 251 202  NEUTROABS 1.7 - 7.7 K/uL - - -  LYMPHSABS 0.7 - 4.0 K/uL - - -     There is no height or weight on file to calculate BMI.  Orders:  No orders of the defined types were  placed in this encounter.  No orders of the defined types were placed in this encounter.    Procedures: No procedures performed  Clinical Data: No additional findings.  ROS:  All other systems negative, except as noted in the HPI. Review of Systems  Objective: Vital Signs: There were no vitals taken for this visit.  Specialty Comments:  No specialty comments available.  PMFS History: Patient Active Problem List   Diagnosis Date Noted  . Acute osteomyelitis of toe, right (HCC)   . Diabetic polyneuropathy associated with type 2 diabetes mellitus (HCC) 08/14/2016  . 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 Ulcer of left heel, limited to breakdown of skin (HCC) 05/15/2016  . Amputated toe of right foot (HCC) 07/27/2015  . Great toe pain  06/17/2014  . Type II diabetes mellitus with peripheral circulatory disorder (HCC) 06/17/2014  . Elevated transaminase level 06/17/2014  . HTN (hypertension) 06/17/2014  . Visual disturbance    Past Medical History:  Diagnosis Date  . Blood transfusion 2009  . Diabetes mellitus    type 2  . Hyperlipemia   . Hypertension    denies  . Leg swelling   . Osteomyelitis (HCC)    right 5th toe  . Visual disturbance     Family History  Problem Relation Age of Onset  . Cancer Mother        breast    Past Surgical History:  Procedure Laterality Date  . AMPUTATION Left 2012   Left Great Toe  . AMPUTATION Right 04/20/2015   Procedure: Right Great Toe Amputation;  Surgeon: [name], MD;  Location: [facility] OR;  Service: Orthopedics;  Laterality: Right;  . AMPUTATION Right 07/27/2015   Procedure: Right 2nd Toe Amputation;  Surgeon: [name], MD;  Location: [facility] OR;  Service: Orthopedics;  Laterality: Right;  . AMPUTATION Right 09/24/2016   Procedure: Right 5th Toe Amputation;  Surgeon: [name], MD;  Location: [facility] OR;  Service: Orthopedics;  Laterality: Right;  . AMPUTATION Right 01/07/2017   Procedure: Right Foot 3rd and 4th Toe Amputation;  Surgeon: [name], MD;  Location: [facility] OR;  Service: Orthopedics;  Laterality: Right;  . CHOLECYSTECTOMY  03/2011  . EYE SURGERY     lazer  . foot surgery  11/2007   small toe on left foot removed.  Marland Kitchen NASAL SEPTUM SURGERY  1981   Due to broken nose. Surgery was to repair how bone healed.  Marland Kitchen PARS PLANA VITRECTOMY  11/17/2011   Procedure: PARS PLANA VITRECTOMY WITH 23 GAUGE;  Surgeon: Shade Flood, MD;  Location: [facility] OR;  Service: Ophthalmology;  Laterality: Right;  With Endolaser  . TOE AMPUTATION Left    5 th  . TONSILLECTOMY     Social History   Occupational History  . Not on file  Tobacco Use  . Smoking status: Former Smoker    Years: 5.00    Types: Cigarettes    Quit date: 03/25/1979    Years since quitting: 41.4  . Smokeless  tobacco: Never Used  Vaping Use  . Vaping Use: Never used  Substance and Sexual Activity  . Alcohol use: Yes    Comment: occasional  24 pk of malt liqour a month  . Drug use: No  . Sexual activity: Not on file

## 2020-09-03 ENCOUNTER — Ambulatory Visit: Payer: Medicaid Other | Admitting: Physician Assistant

## 2020-09-04 ENCOUNTER — Encounter: Payer: Self-pay | Admitting: Physician Assistant

## 2020-09-04 ENCOUNTER — Telehealth: Payer: Self-pay | Admitting: Physician Assistant

## 2020-09-04 ENCOUNTER — Ambulatory Visit (INDEPENDENT_AMBULATORY_CARE_PROVIDER_SITE_OTHER): Payer: Medicaid Other | Admitting: Physician Assistant

## 2020-09-04 DIAGNOSIS — L97521 Non-pressure chronic ulcer of other part of left foot limited to breakdown of skin: Secondary | ICD-10-CM | POA: Diagnosis not present

## 2020-09-04 DIAGNOSIS — I87323 Chronic venous hypertension (idiopathic) with inflammation of bilateral lower extremity: Secondary | ICD-10-CM | POA: Diagnosis not present

## 2020-09-04 NOTE — Telephone Encounter (Signed)
Please see message below

## 2020-09-04 NOTE — Telephone Encounter (Signed)
Patient called. He has some questions about the socks. His call back number is [phone]

## 2020-09-04 NOTE — Progress Notes (Signed)
Office Visit Note   Patient: [name]           Date of Birth: July 15, 1960           MRN: [id number] Visit Date: 09/04/2020              Requested by: Fleet Contras, MD [address] [place],  [place] [address] PCP: Fleet Contras, MD  Chief Complaint  Patient presents with  . Left Foot - Follow-up      HPI: Presents today for follow-up plantar heel ulcer.  He has been doing dressing changes with Silvadene.  He wears a compression sock over this.  He admits that he only changes his socks every week or so.  Assessment & Plan: Visit Diagnoses: No diagnosis found.  Plan: Encourage patient to change his socks daily.  I did give him an extra-large compression 15-20 vive sock today.  He does not have to wear a dressing beneath this.  I explained why it is good to have his contact with the skin.  He would like to try a stronger compression which I think would be fine but I explained to him to try this first to see if he likes this particular sock.  We will follow-up in 1 month.  Follow-Up Instructions: No follow-ups on file.   Ortho Exam  Patient is alert, oriented, no adenopathy, well-dressed, normal affect, normal respiratory effort. Heel ulcer has just a very superficial 1 x 2 area granulation tissue no surrounding erythema no drainage no foul odor no ascending cellulitis or signs of infection  Imaging: No results found. No images are attached to the encounter.  Labs: Lab Results  Component Value Date   HGBA1C 7.1 (H) 09/24/2016   HGBA1C 6.8 (H) 06/17/2014   HGBA1C (H) 11/26/2006    10.4 (NOTE)   The ADA recommends the following therapeutic goals for glycemic   control related to Hgb A1C measurement:   Goal of Therapy:   < 7.0% Hgb A1C   Action Suggested:  > 8.0% Hgb A1C   Ref:  Diabetes Care, 22, Suppl. 1, 1999   ESRSEDRATE 45 (H) 06/17/2014   CRP 1.5 (H) 06/17/2014   REPTSTATUS 06/19/2014 FINAL 06/17/2014   GRAMSTAIN  06/17/2014    RARE WBC PRESENT,  PREDOMINANTLY PMN NO SQUAMOUS EPITHELIAL CELLS SEEN NO ORGANISMS SEEN Performed at [facility]    CULT  06/17/2014    MULTIPLE ORGANISMS PRESENT, NONE PREDOMINANT Note: NO STAPHYLOCOCCUS AUREUS ISOLATED NO GROUP A STREP (S.PYOGENES) ISOLATED Performed at [facility]    LABORGA STAPHYLOCOCCUS AUREUS 11/16/2007   LABORGA MORGANELLA MORGANII 11/16/2007     Lab Results  Component Value Date   ALBUMIN 3.4 (L) 07/27/2015   ALBUMIN 3.3 (L) 04/20/2015   ALBUMIN 3.6 06/17/2014    Lab Results  Component Value Date   MG 1.8 11/26/2007   No results found for: VD25OH  No results found for: PREALBUMIN CBC EXTENDED Latest Ref Rng & Units 01/07/2017 09/24/2016 07/27/2015  WBC 4.0 - 10.5 K/uL 9.9 8.5 8.9  RBC 4.22 - 5.81 MIL/uL 5.07 4.89 5.08  HGB 13.0 - 17.0 g/dL 12.9(L) 12.5(L) 12.7(L)  HCT 39.0 - 52.0 % 40.6 39.8 41.1  PLT 150 - 400 K/uL 231 251 202  NEUTROABS 1.7 - 7.7 K/uL - - -  LYMPHSABS 0.7 - 4.0 K/uL - - -     There is no height or weight on file to calculate BMI.  Orders:  No orders of the defined types were  placed in this encounter.  No orders of the defined types were placed in this encounter.    Procedures: No procedures performed  Clinical Data: No additional findings.  ROS:  All other systems negative, except as noted in the HPI. Review of Systems  Objective: Vital Signs: There were no vitals taken for this visit.  Specialty Comments:  No specialty comments available.  PMFS History: Patient Active Problem List   Diagnosis Date Noted  . Acute osteomyelitis of toe, right (HCC)   . Diabetic polyneuropathy associated with type 2 diabetes mellitus (HCC) 08/14/2016  . 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 Ulcer of left heel, limited to breakdown of skin (HCC) 05/15/2016  . Amputated toe of right foot (HCC) 07/27/2015  . Great toe pain 06/17/2014  . Type II diabetes mellitus  with peripheral circulatory disorder (HCC) 06/17/2014  . Elevated transaminase level 06/17/2014  . HTN (hypertension) 06/17/2014  . Visual disturbance    Past Medical History:  Diagnosis Date  . Blood transfusion 2009  . Diabetes mellitus    type 2  . Hyperlipemia   . Hypertension    denies  . Leg swelling   . Osteomyelitis (HCC)    right 5th toe  . Visual disturbance     Family History  Problem Relation Age of Onset  . Cancer Mother        breast    Past Surgical History:  Procedure Laterality Date  . AMPUTATION Left 2012   Left Great Toe  . AMPUTATION Right 04/20/2015   Procedure: Right Great Toe Amputation;  Surgeon: [name], MD;  Location: [facility] OR;  Service: Orthopedics;  Laterality: Right;  . AMPUTATION Right 07/27/2015   Procedure: Right 2nd Toe Amputation;  Surgeon: [name], MD;  Location: [facility] OR;  Service: Orthopedics;  Laterality: Right;  . AMPUTATION Right 09/24/2016   Procedure: Right 5th Toe Amputation;  Surgeon: [name], MD;  Location: [facility] OR;  Service: Orthopedics;  Laterality: Right;  . AMPUTATION Right 01/07/2017   Procedure: Right Foot 3rd and 4th Toe Amputation;  Surgeon: [name], MD;  Location: [facility] OR;  Service: Orthopedics;  Laterality: Right;  . CHOLECYSTECTOMY  03/2011  . EYE SURGERY     lazer  . foot surgery  11/2007   small toe on left foot removed.  Marland Kitchen NASAL SEPTUM SURGERY  1981   Due to broken nose. Surgery was to repair how bone healed.  Marland Kitchen PARS PLANA VITRECTOMY  11/17/2011   Procedure: PARS PLANA VITRECTOMY WITH 23 GAUGE;  Surgeon: Shade Flood, MD;  Location: [facility] OR;  Service: Ophthalmology;  Laterality: Right;  With Endolaser  . TOE AMPUTATION Left    5 th  . TONSILLECTOMY     Social History   Occupational History  . Not on file  Tobacco Use  . Smoking status: Former Smoker    Years: 5.00    Types: Cigarettes    Quit date: 03/25/1979    Years since quitting: 41.4  . Smokeless tobacco: Never Used  Vaping Use  . Vaping  Use: Never used  Substance and Sexual Activity  . Alcohol use: Yes    Comment: occasional  24 pk of malt liqour a month  . Drug use: No  . Sexual activity: Not on file

## 2020-10-04 ENCOUNTER — Encounter: Payer: Self-pay | Admitting: Orthopedic Surgery

## 2020-10-04 ENCOUNTER — Ambulatory Visit (INDEPENDENT_AMBULATORY_CARE_PROVIDER_SITE_OTHER): Payer: Medicaid Other | Admitting: Orthopedic Surgery

## 2020-10-04 DIAGNOSIS — L97521 Non-pressure chronic ulcer of other part of left foot limited to breakdown of skin: Secondary | ICD-10-CM | POA: Diagnosis not present

## 2020-10-04 NOTE — Progress Notes (Signed)
Office Visit Note   Patient: [name]           Date of Birth: 1960/12/02           MRN: [id number] Visit Date: 10/04/2020              Requested by: Fleet Contras, MD [address] [place],  [place] [address] PCP: Fleet Contras, MD  Chief Complaint  Patient presents with  . Left Foot - Follow-up      HPI: Patient is a 60 year old gentleman who presents in follow-up for Wagner grade 1 ulcer plantar aspect left heel.  Patient has been doing dry dressing changes at this time.  Assessment & Plan: Visit Diagnoses:  1. Skin ulcer of foot, left, limited to breakdown of skin (HCC)     Plan: Patient's ulcer is showing excellent improvement he will continue with a dry dressing change continue with custom orthotics.  Follow-Up Instructions: Return in about 4 weeks (around 11/01/2020).   Ortho Exam  Patient is alert, oriented, no adenopathy, well-dressed, normal affect, normal respiratory effort. Examination patient's left heel ulcer shows good improvement with new epithelialization.  The ulcer is 10 mm in diameter there is nonviable skin around the wound edges.  After informed consent a 10 blade knife was used to debride the wound of skin and soft tissue back to bleeding viable granulation tissue.  There is no exposed bone or tendon no tunneling.  After debridement the ulcer is 2 cm in diameter 3 mm deep.  There was good granulation tissue that was touched with silver nitrate a dry dressing was applied.  Imaging: No results found. No images are attached to the encounter.  Labs: Lab Results  Component Value Date   HGBA1C 7.1 (H) 09/24/2016   HGBA1C 6.8 (H) 06/17/2014   HGBA1C (H) 11/26/2006    10.4 (NOTE)   The ADA recommends the following therapeutic goals for glycemic   control related to Hgb A1C measurement:   Goal of Therapy:   < 7.0% Hgb A1C   Action Suggested:  > 8.0% Hgb A1C   Ref:  Diabetes Care, 22, Suppl. 1, 1999   ESRSEDRATE 45 (H) 06/17/2014   CRP 1.5 (H)  06/17/2014   REPTSTATUS 06/19/2014 FINAL 06/17/2014   GRAMSTAIN  06/17/2014    RARE WBC PRESENT, PREDOMINANTLY PMN NO SQUAMOUS EPITHELIAL CELLS SEEN NO ORGANISMS SEEN Performed at [facility]    CULT  06/17/2014    MULTIPLE ORGANISMS PRESENT, NONE PREDOMINANT Note: NO STAPHYLOCOCCUS AUREUS ISOLATED NO GROUP A STREP (S.PYOGENES) ISOLATED Performed at [facility]    LABORGA STAPHYLOCOCCUS AUREUS 11/16/2007   LABORGA MORGANELLA MORGANII 11/16/2007     Lab Results  Component Value Date   ALBUMIN 3.4 (L) 07/27/2015   ALBUMIN 3.3 (L) 04/20/2015   ALBUMIN 3.6 06/17/2014    Lab Results  Component Value Date   MG 1.8 11/26/2007   No results found for: VD25OH  No results found for: PREALBUMIN CBC EXTENDED Latest Ref Rng & Units 01/07/2017 09/24/2016 07/27/2015  WBC 4.0 - 10.5 K/uL 9.9 8.5 8.9  RBC 4.22 - 5.81 MIL/uL 5.07 4.89 5.08  HGB 13.0 - 17.0 g/dL 12.9(L) 12.5(L) 12.7(L)  HCT 39.0 - 52.0 % 40.6 39.8 41.1  PLT 150 - 400 K/uL 231 251 202  NEUTROABS 1.7 - 7.7 K/uL - - -  LYMPHSABS 0.7 - 4.0 K/uL - - -     There is no height or weight on file to calculate BMI.  Orders:  No orders of the defined types were placed in this encounter.  No orders of the defined types were placed in this encounter.    Procedures: No procedures performed  Clinical Data: No additional findings.  ROS:  All other systems negative, except as noted in the HPI. Review of Systems  Objective: Vital Signs: There were no vitals taken for this visit.  Specialty Comments:  No specialty comments available.  PMFS History: Patient Active Problem List   Diagnosis Date Noted  . Acute osteomyelitis of toe, right (HCC)   . Diabetic polyneuropathy associated with type 2 diabetes mellitus (HCC) 08/14/2016  . 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 Ulcer of left heel, limited to breakdown of skin (HCC) 05/15/2016  .  Amputated toe of right foot (HCC) 07/27/2015  . Great toe pain 06/17/2014  . Type II diabetes mellitus with peripheral circulatory disorder (HCC) 06/17/2014  . Elevated transaminase level 06/17/2014  . HTN (hypertension) 06/17/2014  . Visual disturbance    Past Medical History:  Diagnosis Date  . Blood transfusion 2009  . Diabetes mellitus    type 2  . Hyperlipemia   . Hypertension    denies  . Leg swelling   . Osteomyelitis (HCC)    right 5th toe  . Visual disturbance     Family History  Problem Relation Age of Onset  . Cancer Mother        breast    Past Surgical History:  Procedure Laterality Date  . AMPUTATION Left 2012   Left Great Toe  . AMPUTATION Right 04/20/2015   Procedure: Right Great Toe Amputation;  Surgeon: [name], MD;  Location: [facility] OR;  Service: Orthopedics;  Laterality: Right;  . AMPUTATION Right 07/27/2015   Procedure: Right 2nd Toe Amputation;  Surgeon: [name], MD;  Location: [facility] OR;  Service: Orthopedics;  Laterality: Right;  . AMPUTATION Right 09/24/2016   Procedure: Right 5th Toe Amputation;  Surgeon: [name], MD;  Location: [facility] OR;  Service: Orthopedics;  Laterality: Right;  . AMPUTATION Right 01/07/2017   Procedure: Right Foot 3rd and 4th Toe Amputation;  Surgeon: [name], MD;  Location: [facility] OR;  Service: Orthopedics;  Laterality: Right;  . CHOLECYSTECTOMY  03/2011  . EYE SURGERY     lazer  . foot surgery  11/2007   small toe on left foot removed.  Marland Kitchen NASAL SEPTUM SURGERY  1981   Due to broken nose. Surgery was to repair how bone healed.  Marland Kitchen PARS PLANA VITRECTOMY  11/17/2011   Procedure: PARS PLANA VITRECTOMY WITH 23 GAUGE;  Surgeon: Shade Flood, MD;  Location: [facility] OR;  Service: Ophthalmology;  Laterality: Right;  With Endolaser  . TOE AMPUTATION Left    5 th  . TONSILLECTOMY     Social History   Occupational History  . Not on file  Tobacco Use  . Smoking status: Former Smoker    Years: 5.00    Types: Cigarettes    Quit  date: 03/25/1979    Years since quitting: 41.5  . Smokeless tobacco: Never Used  Vaping Use  . Vaping Use: Never used  Substance and Sexual Activity  . Alcohol use: Yes    Comment: occasional  24 pk of malt liqour a month  . Drug use: No  . Sexual activity: Not on file

## 2020-11-01 ENCOUNTER — Ambulatory Visit (INDEPENDENT_AMBULATORY_CARE_PROVIDER_SITE_OTHER): Payer: Medicaid Other | Admitting: Physician Assistant

## 2020-11-01 ENCOUNTER — Encounter: Payer: Self-pay | Admitting: Physician Assistant

## 2020-11-01 ENCOUNTER — Ambulatory Visit (INDEPENDENT_AMBULATORY_CARE_PROVIDER_SITE_OTHER): Payer: Medicaid Other

## 2020-11-01 DIAGNOSIS — L97521 Non-pressure chronic ulcer of other part of left foot limited to breakdown of skin: Secondary | ICD-10-CM | POA: Diagnosis not present

## 2020-11-01 MED ORDER — RISAQUAD PO CAPS: 1.0000 | ORAL_CAPSULE | Freq: Every day | ORAL | Status: DC

## 2020-11-01 MED ORDER — DOXYCYCLINE HYCLATE 100 MG PO TABS: 100.0000 mg | ORAL_TABLET | Freq: Two times a day (BID) | ORAL | 0 refills | Status: DC

## 2020-11-01 NOTE — Progress Notes (Signed)
Office Visit Note   Patient: [name]           Date of Birth: June 23, 1960           MRN: [id number] Visit Date: 11/01/2020              Requested by: Fleet Contras, MD [address] [place],  [place] [address] PCP: Fleet Contras, MD  Chief Complaint  Patient presents with  . Left Foot - Follow-up      HPI: Patient presents in follow-up for his left heel ulcer.  He says it does hurt a little bit more.  He gets some bloody drainage and changes the dressing about twice daily.  Assessment & Plan: Visit Diagnoses:  1. Skin ulcer of foot, left, limited to breakdown of skin (HCC)     Plan: Patient will be on an antibiotic for a week along with a probiotic he will follow-up at that time Follow-Up Instructions: No follow-ups on file.   Ortho Exam  Patient is alert, oriented, no adenopathy, well-dressed, normal affect, normal respiratory effort. Examination demonstrates rocker-bottom deformity of the heel.  He has a 4 cm ulcer with skin maceration surrounding it.  After verbal consent this was debrided to a healthy surface.  There is no abscess it does not probe to bone there is no tunneling no surrounding cellulitis or erythema.No tunneling  Imaging: XR Os Calcis Left  Result Date: 11/01/2020 X-rays of his heel reveal rocker-bottom deformity of the heel but no acute osseous changes.  Ulcer does not communicate with bone  No images are attached to the encounter.  Labs: Lab Results  Component Value Date   HGBA1C 7.1 (H) 09/24/2016   HGBA1C 6.8 (H) 06/17/2014   HGBA1C (H) 11/26/2006    10.4 (NOTE)   The ADA recommends the following therapeutic goals for glycemic   control related to Hgb A1C measurement:   Goal of Therapy:   < 7.0% Hgb A1C   Action Suggested:  > 8.0% Hgb A1C   Ref:  Diabetes Care, 22, Suppl. 1, 1999   ESRSEDRATE 45 (H) 06/17/2014   CRP 1.5 (H) 06/17/2014   REPTSTATUS 06/19/2014 FINAL 06/17/2014   GRAMSTAIN  06/17/2014    RARE WBC PRESENT,  PREDOMINANTLY PMN NO SQUAMOUS EPITHELIAL CELLS SEEN NO ORGANISMS SEEN Performed at [facility]    CULT  06/17/2014    MULTIPLE ORGANISMS PRESENT, NONE PREDOMINANT Note: NO STAPHYLOCOCCUS AUREUS ISOLATED NO GROUP A STREP (S.PYOGENES) ISOLATED Performed at [facility]    LABORGA STAPHYLOCOCCUS AUREUS 11/16/2007   LABORGA MORGANELLA MORGANII 11/16/2007     Lab Results  Component Value Date   ALBUMIN 3.4 (L) 07/27/2015   ALBUMIN 3.3 (L) 04/20/2015   ALBUMIN 3.6 06/17/2014    Lab Results  Component Value Date   MG 1.8 11/26/2007   No results found for: VD25OH  No results found for: PREALBUMIN CBC EXTENDED Latest Ref Rng & Units 01/07/2017 09/24/2016 07/27/2015  WBC 4.0 - 10.5 K/uL 9.9 8.5 8.9  RBC 4.22 - 5.81 MIL/uL 5.07 4.89 5.08  HGB 13.0 - 17.0 g/dL 12.9(L) 12.5(L) 12.7(L)  HCT 39.0 - 52.0 % 40.6 39.8 41.1  PLT 150 - 400 K/uL 231 251 202  NEUTROABS 1.7 - 7.7 K/uL - - -  LYMPHSABS 0.7 - 4.0 K/uL - - -     There is no height or weight on file to calculate BMI.  Orders:  Orders Placed This Encounter  Procedures  . XR Os Calcis Left  No orders of the defined types were placed in this encounter.    Procedures: No procedures performed  Clinical Data: No additional findings.  ROS:  All other systems negative, except as noted in the HPI. Review of Systems  Objective: Vital Signs: There were no vitals taken for this visit.  Specialty Comments:  No specialty comments available.  PMFS History: Patient Active Problem List   Diagnosis Date Noted  . Acute osteomyelitis of toe, right (HCC)   . Diabetic polyneuropathy associated with type 2 diabetes mellitus (HCC) 08/14/2016  . 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 Ulcer of left heel, limited to breakdown of skin (HCC) 05/15/2016  . Amputated toe of right foot (HCC) 07/27/2015  . Great toe pain 06/17/2014  . Type II diabetes  mellitus with peripheral circulatory disorder (HCC) 06/17/2014  . Elevated transaminase level 06/17/2014  . HTN (hypertension) 06/17/2014  . Visual disturbance    Past Medical History:  Diagnosis Date  . Blood transfusion 2009  . Diabetes mellitus    type 2  . Hyperlipemia   . Hypertension    denies  . Leg swelling   . Osteomyelitis (HCC)    right 5th toe  . Visual disturbance     Family History  Problem Relation Age of Onset  . Cancer Mother        breast    Past Surgical History:  Procedure Laterality Date  . AMPUTATION Left 2012   Left Great Toe  . AMPUTATION Right 04/20/2015   Procedure: Right Great Toe Amputation;  Surgeon: [name], MD;  Location: [facility] OR;  Service: Orthopedics;  Laterality: Right;  . AMPUTATION Right 07/27/2015   Procedure: Right 2nd Toe Amputation;  Surgeon: [name], MD;  Location: [facility] OR;  Service: Orthopedics;  Laterality: Right;  . AMPUTATION Right 09/24/2016   Procedure: Right 5th Toe Amputation;  Surgeon: [name], MD;  Location: [facility] OR;  Service: Orthopedics;  Laterality: Right;  . AMPUTATION Right 01/07/2017   Procedure: Right Foot 3rd and 4th Toe Amputation;  Surgeon: [name], MD;  Location: [facility] OR;  Service: Orthopedics;  Laterality: Right;  . CHOLECYSTECTOMY  03/2011  . EYE SURGERY     lazer  . foot surgery  11/2007   small toe on left foot removed.  Marland Kitchen NASAL SEPTUM SURGERY  1981   Due to broken nose. Surgery was to repair how bone healed.  Marland Kitchen PARS PLANA VITRECTOMY  11/17/2011   Procedure: PARS PLANA VITRECTOMY WITH 23 GAUGE;  Surgeon: Shade Flood, MD;  Location: [facility] OR;  Service: Ophthalmology;  Laterality: Right;  With Endolaser  . TOE AMPUTATION Left    5 th  . TONSILLECTOMY     Social History   Occupational History  . Not on file  Tobacco Use  . Smoking status: Former Smoker    Years: 5.00    Types: Cigarettes    Quit date: 03/25/1979    Years since quitting: 41.6  . Smokeless tobacco: Never Used  Vaping Use  .  Vaping Use: Never used  Substance and Sexual Activity  . Alcohol use: Yes    Comment: occasional  24 pk of malt liqour a month  . Drug use: No  . Sexual activity: Not on file

## 2020-11-08 ENCOUNTER — Ambulatory Visit (INDEPENDENT_AMBULATORY_CARE_PROVIDER_SITE_OTHER): Payer: Medicaid Other | Admitting: Physician Assistant

## 2020-11-08 ENCOUNTER — Encounter: Payer: Self-pay | Admitting: Physician Assistant

## 2020-11-08 ENCOUNTER — Other Ambulatory Visit: Payer: Self-pay

## 2020-11-08 DIAGNOSIS — L97521 Non-pressure chronic ulcer of other part of left foot limited to breakdown of skin: Secondary | ICD-10-CM | POA: Diagnosis not present

## 2020-11-08 NOTE — Progress Notes (Signed)
Office Visit Note   Patient: [name]           Date of Birth: 1961/02/01           MRN: [id number] Visit Date: 11/08/2020              Requested by: Fleet Contras, MD [address] [place],  [place] [address] PCP: Fleet Contras, MD  Chief Complaint  Patient presents with  . Left Foot - Follow-up    Heel ulcer       HPI: Patient presents in follow-up for his left heel ulcer and his chronic venous stasis disease of his left lower extremity.  At his last visit I had him wear his vive compression stocking.  He comes in today saying that his leg is more swollen because of the compression stocking.  Assessment & Plan: Visit Diagnoses: No diagnosis found.  Plan: Patient will be placed in a Profore wrap today.  Follow-up in 1 week.  Follow-Up Instructions: No follow-ups on file.   Ortho Exam  Patient is alert, oriented, no adenopathy, well-dressed, normal affect, normal respiratory effort. Examination he has some brisk bleeding from the heel ulcer does not probe deeply no surrounding cellulitis no tunneling.  This was touched with silver nitrate.  No ascending cellulitis he has skin changes consistent with venous stasis disease he has no ulcers but he does have some thinning of the skin and areas.  His calves are soft he has no tenderness to palpation  Imaging: No results found. No images are attached to the encounter.  Labs: Lab Results  Component Value Date   HGBA1C 7.1 (H) 09/24/2016   HGBA1C 6.8 (H) 06/17/2014   HGBA1C (H) 11/26/2006    10.4 (NOTE)   The ADA recommends the following therapeutic goals for glycemic   control related to Hgb A1C measurement:   Goal of Therapy:   < 7.0% Hgb A1C   Action Suggested:  > 8.0% Hgb A1C   Ref:  Diabetes Care, 22, Suppl. 1, 1999   ESRSEDRATE 45 (H) 06/17/2014   CRP 1.5 (H) 06/17/2014   REPTSTATUS 06/19/2014 FINAL 06/17/2014   GRAMSTAIN  06/17/2014    RARE WBC PRESENT, PREDOMINANTLY PMN NO SQUAMOUS EPITHELIAL CELLS  SEEN NO ORGANISMS SEEN Performed at [facility]    CULT  06/17/2014    MULTIPLE ORGANISMS PRESENT, NONE PREDOMINANT Note: NO STAPHYLOCOCCUS AUREUS ISOLATED NO GROUP A STREP (S.PYOGENES) ISOLATED Performed at [facility]    LABORGA STAPHYLOCOCCUS AUREUS 11/16/2007   LABORGA MORGANELLA MORGANII 11/16/2007     Lab Results  Component Value Date   ALBUMIN 3.4 (L) 07/27/2015   ALBUMIN 3.3 (L) 04/20/2015   ALBUMIN 3.6 06/17/2014    Lab Results  Component Value Date   MG 1.8 11/26/2007   No results found for: VD25OH  No results found for: PREALBUMIN CBC EXTENDED Latest Ref Rng & Units 01/07/2017 09/24/2016 07/27/2015  WBC 4.0 - 10.5 K/uL 9.9 8.5 8.9  RBC 4.22 - 5.81 MIL/uL 5.07 4.89 5.08  HGB 13.0 - 17.0 g/dL 12.9(L) 12.5(L) 12.7(L)  HCT 39.0 - 52.0 % 40.6 39.8 41.1  PLT 150 - 400 K/uL 231 251 202  NEUTROABS 1.7 - 7.7 K/uL - - -  LYMPHSABS 0.7 - 4.0 K/uL - - -     There is no height or weight on file to calculate BMI.  Orders:  No orders of the defined types were placed in this encounter.  No orders of the defined types were placed  in this encounter.    Procedures: No procedures performed  Clinical Data: No additional findings.  ROS:  All other systems negative, except as noted in the HPI. Review of Systems  Objective: Vital Signs: There were no vitals taken for this visit.  Specialty Comments:  No specialty comments available.  PMFS History: Patient Active Problem List   Diagnosis Date Noted  . Acute osteomyelitis of toe, right (HCC)   . Diabetic polyneuropathy associated with type 2 diabetes mellitus (HCC) 08/14/2016  . 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 Ulcer of left heel, limited to breakdown of skin (HCC) 05/15/2016  . Amputated toe of right foot (HCC) 07/27/2015  . Great toe pain 06/17/2014  . Type II diabetes mellitus with peripheral circulatory disorder (HCC)  06/17/2014  . Elevated transaminase level 06/17/2014  . HTN (hypertension) 06/17/2014  . Visual disturbance    Past Medical History:  Diagnosis Date  . Blood transfusion 2009  . Diabetes mellitus    type 2  . Hyperlipemia   . Hypertension    denies  . Leg swelling   . Osteomyelitis (HCC)    right 5th toe  . Visual disturbance     Family History  Problem Relation Age of Onset  . Cancer Mother        breast    Past Surgical History:  Procedure Laterality Date  . AMPUTATION Left 2012   Left Great Toe  . AMPUTATION Right 04/20/2015   Procedure: Right Great Toe Amputation;  Surgeon: [name], MD;  Location: [facility] OR;  Service: Orthopedics;  Laterality: Right;  . AMPUTATION Right 07/27/2015   Procedure: Right 2nd Toe Amputation;  Surgeon: [name], MD;  Location: [facility] OR;  Service: Orthopedics;  Laterality: Right;  . AMPUTATION Right 09/24/2016   Procedure: Right 5th Toe Amputation;  Surgeon: [name], MD;  Location: [facility] OR;  Service: Orthopedics;  Laterality: Right;  . AMPUTATION Right 01/07/2017   Procedure: Right Foot 3rd and 4th Toe Amputation;  Surgeon: [name], MD;  Location: [facility] OR;  Service: Orthopedics;  Laterality: Right;  . CHOLECYSTECTOMY  03/2011  . EYE SURGERY     lazer  . foot surgery  11/2007   small toe on left foot removed.  Marland Kitchen NASAL SEPTUM SURGERY  1981   Due to broken nose. Surgery was to repair how bone healed.  Marland Kitchen PARS PLANA VITRECTOMY  11/17/2011   Procedure: PARS PLANA VITRECTOMY WITH 23 GAUGE;  Surgeon: Shade Flood, MD;  Location: [facility] OR;  Service: Ophthalmology;  Laterality: Right;  With Endolaser  . TOE AMPUTATION Left    5 th  . TONSILLECTOMY     Social History   Occupational History  . Not on file  Tobacco Use  . Smoking status: Former Smoker    Years: 5.00    Types: Cigarettes    Quit date: 03/25/1979    Years since quitting: 41.6  . Smokeless tobacco: Never Used  Vaping Use  . Vaping Use: Never used  Substance and Sexual  Activity  . Alcohol use: Yes    Comment: occasional  24 pk of malt liqour a month  . Drug use: No  . Sexual activity: Not on file

## 2020-11-15 ENCOUNTER — Other Ambulatory Visit: Payer: Self-pay

## 2020-11-15 ENCOUNTER — Ambulatory Visit (INDEPENDENT_AMBULATORY_CARE_PROVIDER_SITE_OTHER): Payer: Medicaid Other | Admitting: Orthopedic Surgery

## 2020-11-15 DIAGNOSIS — I87323 Chronic venous hypertension (idiopathic) with inflammation of bilateral lower extremity: Secondary | ICD-10-CM

## 2020-11-15 DIAGNOSIS — L97521 Non-pressure chronic ulcer of other part of left foot limited to breakdown of skin: Secondary | ICD-10-CM

## 2020-11-22 ENCOUNTER — Encounter: Payer: Self-pay | Admitting: Orthopedic Surgery

## 2020-11-22 ENCOUNTER — Ambulatory Visit (INDEPENDENT_AMBULATORY_CARE_PROVIDER_SITE_OTHER): Payer: Medicaid Other | Admitting: Orthopedic Surgery

## 2020-11-22 ENCOUNTER — Ambulatory Visit: Payer: Medicaid Other | Admitting: Orthopedic Surgery

## 2020-11-22 ENCOUNTER — Other Ambulatory Visit: Payer: Self-pay

## 2020-11-22 DIAGNOSIS — I87323 Chronic venous hypertension (idiopathic) with inflammation of bilateral lower extremity: Secondary | ICD-10-CM

## 2020-11-22 DIAGNOSIS — L97521 Non-pressure chronic ulcer of other part of left foot limited to breakdown of skin: Secondary | ICD-10-CM

## 2020-11-22 NOTE — Progress Notes (Signed)
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  Patient states he took the wrap off Monday stating that he could not get into the shoe comfortably.  Patient states he has significant more swelling with weeping edema out of his legs.  Assessment & Plan: Visit Diagnoses:  1. Skin ulcer of foot, left, limited to breakdown of skin (HCC)   2. Chronic venous hypertension (idiopathic) with inflammation of bilateral lower extremity     Plan: We will plan for felt relieving donut silver cell to the wound Dynaflex compression wrap to replace his custom orthotic in the postoperative shoe we will follow-up Monday.  Discussed the importance of elevation and to continue the compression.  Follow-Up Instructions: Return in about 1 week (around 11/29/2020).   Ortho Exam  Patient is alert, oriented, no adenopathy, well-dressed, normal affect, normal respiratory effort. Examination patient has massive swelling of the left leg with weeping edema running down the leg.  There are very small petechial ulcers all over the leg.  Examination the heel there is granulation tissue at the base with some maceration around the ulcer.  No further debridement necessary today.  Imaging: No results found. No images are attached to the encounter.  Labs: Lab Results  Component Value Date   HGBA1C 7.1 (H) 09/24/2016   HGBA1C 6.8 (H) 06/17/2014   HGBA1C (H) 11/26/2006    10.4 (NOTE)   The ADA recommends the following therapeutic goals for glycemic   control related to Hgb A1C measurement:    Goal of Therapy:   < 7.0% Hgb A1C   Action Suggested:  > 8.0% Hgb A1C   Ref:  Diabetes Care, 22, Suppl. 1, 1999   ESRSEDRATE 45 (H) 06/17/2014   CRP 1.5 (H) 06/17/2014   REPTSTATUS 06/19/2014 FINAL 06/17/2014   GRAMSTAIN  06/17/2014    RARE WBC PRESENT, PREDOMINANTLY PMN NO SQUAMOUS EPITHELIAL CELLS SEEN NO ORGANISMS SEEN Performed at [facility]    CULT  06/17/2014    MULTIPLE ORGANISMS PRESENT, NONE PREDOMINANT Note: NO STAPHYLOCOCCUS AUREUS ISOLATED NO GROUP A STREP (S.PYOGENES) ISOLATED Performed at [facility]    LABORGA STAPHYLOCOCCUS AUREUS 11/16/2007   LABORGA MORGANELLA MORGANII 11/16/2007     Lab Results  Component Value Date   ALBUMIN 3.4 (L) 07/27/2015   ALBUMIN 3.3 (L) 04/20/2015   ALBUMIN 3.6 06/17/2014    Lab Results  Component Value Date   MG 1.8 11/26/2007   No results found for: VD25OH  No results found for: PREALBUMIN CBC EXTENDED Latest Ref Rng & Units 01/07/2017 09/24/2016 07/27/2015  WBC 4.0 - 10.5 K/uL 9.9 8.5 8.9  RBC 4.22 - 5.81 MIL/uL 5.07 4.89 5.08  HGB 13.0 - 17.0 g/dL 12.9(L) 12.5(L) 12.7(L)  HCT 39.0 - 52.0 % 40.6 39.8 41.1  PLT 150 - 400 K/uL 231 251 202  NEUTROABS 1.7 - 7.7 K/uL - - -  LYMPHSABS 0.7 - 4.0 K/uL - - -  There is no height or weight on file to calculate BMI.  Orders:  No orders of the defined types were placed in this encounter.  No orders of the defined types were placed in this encounter.    Procedures: No procedures performed  Clinical Data: No additional findings.  ROS:  All other systems negative, except as noted in the HPI. Review of Systems  Objective: Vital Signs: There were no vitals taken for this visit.  Specialty Comments:  No specialty comments available.  PMFS History: Patient Active Problem List   Diagnosis Date Noted   Acute osteomyelitis of toe, right (HCC)    Diabetic polyneuropathy associated with type 2 diabetes mellitus (HCC) 08/14/2016   Chronic venous  hypertension (idiopathic) with inflammation of bilateral lower extremity 08/14/2016   Ulcer of left heel, limited to breakdown of skin (HCC) 05/15/2016   Amputated toe of right foot (HCC) 07/27/2015   Great toe pain 06/17/2014   Type II diabetes mellitus with peripheral circulatory disorder (HCC) 06/17/2014   Elevated transaminase level 06/17/2014   HTN (hypertension) 06/17/2014   Visual disturbance    Past Medical History:  Diagnosis Date   Blood transfusion 2009   Diabetes mellitus    type 2   Hyperlipemia    Hypertension    denies   Leg swelling    Osteomyelitis (HCC)    right 5th toe   Visual disturbance     Family History  Problem Relation Age of Onset   Cancer Mother        breast    Past Surgical History:  Procedure Laterality Date   AMPUTATION Left 2012   Left Great Toe   AMPUTATION Right 04/20/2015   Procedure: Right Great Toe Amputation;  Surgeon: [name], MD;  Location: [facility] OR;  Service: Orthopedics;  Laterality: Right;   AMPUTATION Right 07/27/2015   Procedure: Right 2nd Toe Amputation;  Surgeon: [name], MD;  Location: [facility] OR;  Service: Orthopedics;  Laterality: Right;   AMPUTATION Right 09/24/2016   Procedure: Right 5th Toe Amputation;  Surgeon: [name], MD;  Location: [facility] OR;  Service: Orthopedics;  Laterality: Right;   AMPUTATION Right 01/07/2017   Procedure: Right Foot 3rd and 4th Toe Amputation;  Surgeon: [name], MD;  Location[facility] OR;  Service: Orthopedics;  Laterality: Right;   CHOLECYSTECTOMY  03/2011   EYE SURGERY     lazer   foot surgery  11/2007   small toe on left foot removed.   NASAL SEPTUM SURGERY  1981   Due to broken nose. Surgery was to repair how bone healed.   PARS PLANA VITRECTOMY  11/17/2011   Procedure: PARS PLANA VITRECTOMY WITH 23 GAUGE;  Surgeon: Shade Flood, MD;  Location: [facility] OR;  Service: Ophthalmology;  Laterality: Right;  With Endolaser   TOE AMPUTATION Left    5 th   TONSILLECTOMY     Social History    Occupational History   Not on file  Tobacco Use   Smoking status: Former    Years: 5.00    Pack years: 0.00    Types: Cigarettes    Quit date: 03/25/1979    Years since quitting: 41.6   Smokeless tobacco: Never  Vaping Use   Vaping Use: Never used  Substance and Sexual Activity   Alcohol use: Yes    Comment: occasional  24 pk of malt liqour a month   Drug use: No   Sexual activity: Not on file

## 2020-11-25 ENCOUNTER — Encounter: Payer: Self-pay | Admitting: Orthopedic Surgery

## 2020-11-25 NOTE — Progress Notes (Signed)
Office Visit Note   Patient: [name]           Date of Birth: [birth year]           MRN: [id number] Visit Date: 11/15/2020              Requested by: Fleet Contras, MD [address] [place],  [place] [address] PCP: Fleet Contras, MD  Chief Complaint  Patient presents with   Left Leg - Follow-up    Swelling profore wrap applied heel ulcer       HPI: Patient is a 60 year old gentleman who presents in follow-up for recurrent ulceration plantar aspect left heel.  Patient states the swelling has decreased from the Dynaflex compression wrap.  Assessment & Plan: Visit Diagnoses: No diagnosis found.  Plan: Ulcer was debrided patient was placed in a Dynaflex compression wrap with a felt relieving donut to unload pressure from the heel ulcer and silver cell for a wound covering  Follow-Up Instructions: No follow-ups on file.   Ortho Exam  Patient is alert, oriented, no adenopathy, well-dressed, normal affect, normal respiratory effort. Examination of the left lower extremity patient does have decreased swelling there is no venous drainage or ulcers.  The plantar ulcer is 1 cm in diameter and 1 mm deep.  After informed consent a 10 blade knife was used to debride the skin and soft tissue back to healthy viable tissue silver nitrate was used hemostasis after debridement the ulcer was 2 cm in diameter and 1 mm deep.  Imaging: No results found. No images are attached to the encounter.  Labs: Lab Results  Component Value Date   HGBA1C 7.1 (H) 09/24/2016   HGBA1C 6.8 (H) 06/17/2014   HGBA1C (H) 11/26/2006    10.4 (NOTE)   The ADA recommends the following therapeutic goals for glycemic   control related to Hgb A1C measurement:   Goal of Therapy:   < 7.0% Hgb A1C   Action Suggested:  > 8.0% Hgb A1C   Ref:  Diabetes Care, 22, Suppl. 1, 1999   ESRSEDRATE 45 (H) 06/17/2014   CRP 1.5 (H) 06/17/2014   REPTSTATUS 06/19/2014 FINAL 06/17/2014   GRAMSTAIN  06/17/2014    RARE WBC  PRESENT, PREDOMINANTLY PMN NO SQUAMOUS EPITHELIAL CELLS SEEN NO ORGANISMS SEEN Performed at [facility]    CULT  06/17/2014    MULTIPLE ORGANISMS PRESENT, NONE PREDOMINANT Note: NO STAPHYLOCOCCUS AUREUS ISOLATED NO GROUP A STREP (S.PYOGENES) ISOLATED Performed at [facility]    LABORGA STAPHYLOCOCCUS AUREUS 11/16/2007   LABORGA MORGANELLA MORGANII 11/16/2007     Lab Results  Component Value Date   ALBUMIN 3.4 (L) 07/27/2015   ALBUMIN 3.3 (L) 04/20/2015   ALBUMIN 3.6 06/17/2014    Lab Results  Component Value Date   MG 1.8 11/26/2007   No results found for: VD25OH  No results found for: PREALBUMIN CBC EXTENDED Latest Ref Rng & Units 01/07/2017 09/24/2016 07/27/2015  WBC 4.0 - 10.5 K/uL 9.9 8.5 8.9  RBC 4.22 - 5.81 MIL/uL 5.07 4.89 5.08  HGB 13.0 - 17.0 g/dL 12.9(L) 12.5(L) 12.7(L)  HCT 39.0 - 52.0 % 40.6 39.8 41.1  PLT 150 - 400 K/uL 231 251 202  NEUTROABS 1.7 - 7.7 K/uL - - -  LYMPHSABS 0.7 - 4.0 K/uL - - -     There is no height or weight on file to calculate BMI.  Orders:  No orders of the defined types were placed in this encounter.  No orders of the  defined types were placed in this encounter.    Procedures: No procedures performed  Clinical Data: No additional findings.  ROS:  All other systems negative, except as noted in the HPI. Review of Systems  Objective: Vital Signs: There were no vitals taken for this visit.  Specialty Comments:  No specialty comments available.  PMFS History: Patient Active Problem List   Diagnosis Date Noted   Acute osteomyelitis of toe, right (HCC)    Diabetic polyneuropathy associated with type 2 diabetes mellitus (HCC) 08/14/2016   Chronic venous hypertension (idiopathic) with inflammation of bilateral lower extremity 08/14/2016   Ulcer of left heel, limited to breakdown of skin (HCC) 05/15/2016   Amputated toe of right foot (HCC) 07/27/2015   Great toe pain 06/17/2014   Type II diabetes  mellitus with peripheral circulatory disorder (HCC) 06/17/2014   Elevated transaminase level 06/17/2014   HTN (hypertension) 06/17/2014   Visual disturbance    Past Medical History:  Diagnosis Date   Blood transfusion 2009   Diabetes mellitus    type 2   Hyperlipemia    Hypertension    denies   Leg swelling    Osteomyelitis (HCC)    right 5th toe   Visual disturbance     Family History  Problem Relation Age of Onset   Cancer Mother        breast    Past Surgical History:  Procedure Laterality Date   AMPUTATION Left 2012   Left Great Toe   AMPUTATION Right 04/20/2015   Procedure: Right Great Toe Amputation;  Surgeon: [name], MD;  Location: [facility] OR;  Service: Orthopedics;  Laterality: Right;   AMPUTATION Right 07/27/2015   Procedure: Right 2nd Toe Amputation;  Surgeon: [name], MD;  Location: [facility] OR;  Service: Orthopedics;  Laterality: Right;   AMPUTATION Right 09/24/2016   Procedure: Right 5th Toe Amputation;  Surgeon: [name], MD;  Location: [facility] OR;  Service: Orthopedics;  Laterality: Right;   AMPUTATION Right 01/07/2017   Procedure: Right Foot 3rd and 4th Toe Amputation;  Surgeon: [name], MD;  Location: [facility] OR;  Service: Orthopedics;  Laterality: Right;   CHOLECYSTECTOMY  03/2011   EYE SURGERY     lazer   foot surgery  11/2007   small toe on left foot removed.   NASAL SEPTUM SURGERY  1981   Due to broken nose. Surgery was to repair how bone healed.   PARS PLANA VITRECTOMY  11/17/2011   Procedure: PARS PLANA VITRECTOMY WITH 23 GAUGE;  Surgeon: Shade Flood, MD;  Location: [facility] OR;  Service: Ophthalmology;  Laterality: Right;  With Endolaser   TOE AMPUTATION Left    5 th   TONSILLECTOMY     Social History   Occupational History   Not on file  Tobacco Use   Smoking status: Former    Years: 5.00    Pack years: 0.00    Types: Cigarettes    Quit date: 03/25/1979    Years since quitting: 41.7   Smokeless tobacco: Never  Vaping Use   Vaping Use: Never  used  Substance and Sexual Activity   Alcohol use: Yes    Comment: occasional  24 pk of malt liqour a month   Drug use: No   Sexual activity: Not on file

## 2020-11-26 ENCOUNTER — Ambulatory Visit (INDEPENDENT_AMBULATORY_CARE_PROVIDER_SITE_OTHER): Payer: Medicaid Other | Admitting: Orthopedic Surgery

## 2020-11-26 ENCOUNTER — Encounter: Payer: Self-pay | Admitting: Orthopedic Surgery

## 2020-11-26 DIAGNOSIS — M86272 Subacute osteomyelitis, left ankle and foot: Secondary | ICD-10-CM

## 2020-11-26 MED ORDER — DOXYCYCLINE HYCLATE 100 MG PO TABS: 100.0000 mg | ORAL_TABLET | Freq: Two times a day (BID) | ORAL | 0 refills | Status: DC

## 2020-11-26 NOTE — Progress Notes (Signed)
Office Visit Note   Patient: [name]           Date of Birth: [birth year]           MRN: [id number] Visit Date: 11/26/2020              Requested by: Fleet Contras, MD [address] [place],  [place] [address] PCP: Fleet Contras, MD  Chief Complaint  Patient presents with   Left Foot - Follow-up      HPI: Patient is seen in follow-up for recurrent ulcer left heel.  Patient has undergone prolonged wound care and has had chronic history of superficial ulcers over the heel at this time patient has developed a larger ulcer with more depth with concern for bone infection.  Assessment & Plan: Visit Diagnoses:  1. Subacute osteomyelitis of left foot (HCC)     Plan: The ulcer has become significantly larger from the last exam we will set patient up for a stat MRI scan of his left calcaneus.  Discussed with the patient that if this truly is a bone infection we would need to proceed with a below-knee amputation.  Follow-Up Instructions: Return in about 3 days (around 11/29/2020).   Ortho Exam  Patient is alert, oriented, no adenopathy, well-dressed, normal affect, normal respiratory effort. Examination patient has venous swelling in the left lower extremity without open ulcers.  The left plantar ulcer over the calcaneus has become acutely larger and deeper.  The ulcer currently measures 3 cm in diameter and probes to bone.  After informed consent a 10 blade knife was used to debride the skin over the undermined area of this left a wound that was 5 cm in diameter and probes to bone 1 cm deep.  Discussed with the patient I am concerned with acute bony infection we will place him on doxycycline we will obtain a stat MRI scan of his left calcaneus and follow-up on Thursday afternoon.  Imaging: No results found. No images are attached to the encounter.  Labs: Lab Results  Component Value Date   HGBA1C 7.1 (H) 09/24/2016   HGBA1C 6.8 (H) 06/17/2014   HGBA1C (H) 11/26/2006     10.4 (NOTE)   The ADA recommends the following therapeutic goals for glycemic   control related to Hgb A1C measurement:   Goal of Therapy:   < 7.0% Hgb A1C   Action Suggested:  > 8.0% Hgb A1C   Ref:  Diabetes Care, 22, Suppl. 1, 1999   ESRSEDRATE 45 (H) 06/17/2014   CRP 1.5 (H) 06/17/2014   REPTSTATUS 06/19/2014 FINAL 06/17/2014   GRAMSTAIN  06/17/2014    RARE WBC PRESENT, PREDOMINANTLY PMN NO SQUAMOUS EPITHELIAL CELLS SEEN NO ORGANISMS SEEN Performed at [facility]    CULT  06/17/2014    MULTIPLE ORGANISMS PRESENT, NONE PREDOMINANT Note: NO STAPHYLOCOCCUS AUREUS ISOLATED NO GROUP A STREP (S.PYOGENES) ISOLATED Performed at [facility]    LABORGA STAPHYLOCOCCUS AUREUS 11/16/2007   LABORGA MORGANELLA MORGANII 11/16/2007     Lab Results  Component Value Date   ALBUMIN 3.4 (L) 07/27/2015   ALBUMIN 3.3 (L) 04/20/2015   ALBUMIN 3.6 06/17/2014    Lab Results  Component Value Date   MG 1.8 11/26/2007   No results found for: VD25OH  No results found for: PREALBUMIN CBC EXTENDED Latest Ref Rng & Units 01/07/2017 09/24/2016 07/27/2015  WBC 4.0 - 10.5 K/uL 9.9 8.5 8.9  RBC 4.22 - 5.81 MIL/uL 5.07 4.89 5.08  HGB 13.0 - 17.0 g/dL  12.9(L) 12.5(L) 12.7(L)  HCT 39.0 - 52.0 % 40.6 39.8 41.1  PLT 150 - 400 K/uL 231 251 202  NEUTROABS 1.7 - 7.7 K/uL - - -  LYMPHSABS 0.7 - 4.0 K/uL - - -     There is no height or weight on file to calculate BMI.  Orders:  No orders of the defined types were placed in this encounter.  No orders of the defined types were placed in this encounter.    Procedures: No procedures performed  Clinical Data: No additional findings.  ROS:  All other systems negative, except as noted in the HPI. Review of Systems  Objective: Vital Signs: There were no vitals taken for this visit.  Specialty Comments:  No specialty comments available.  PMFS History: Patient Active Problem List   Diagnosis Date Noted   Acute osteomyelitis of toe,  right (HCC)    Diabetic polyneuropathy associated with type 2 diabetes mellitus (HCC) 08/14/2016   Chronic venous hypertension (idiopathic) with inflammation of bilateral lower extremity 08/14/2016   Ulcer of left heel, limited to breakdown of skin (HCC) 05/15/2016   Amputated toe of right foot (HCC) 07/27/2015   Great toe pain 06/17/2014   Type II diabetes mellitus with peripheral circulatory disorder (HCC) 06/17/2014   Elevated transaminase level 06/17/2014   HTN (hypertension) 06/17/2014   Visual disturbance    Past Medical History:  Diagnosis Date   Blood transfusion 2009   Diabetes mellitus    type 2   Hyperlipemia    Hypertension    denies   Leg swelling    Osteomyelitis (HCC)    right 5th toe   Visual disturbance     Family History  Problem Relation Age of Onset   Cancer Mother        breast    Past Surgical History:  Procedure Laterality Date   AMPUTATION Left 2012   Left Great Toe   AMPUTATION Right 04/20/2015   Procedure: Right Great Toe Amputation;  Surgeon: [name], MD;  Location: [facility] OR;  Service: Orthopedics;  Laterality: Right;   AMPUTATION Right 07/27/2015   Procedure: Right 2nd Toe Amputation;  Surgeon: [name], MD;  Location: [facility] OR;  Service: Orthopedics;  Laterality: Right;   AMPUTATION Right 09/24/2016   Procedure: Right 5th Toe Amputation;  Surgeon: [name], MD;  Location: [facility] OR;  Service: Orthopedics;  Laterality: Right;   AMPUTATION Right 01/07/2017   Procedure: Right Foot 3rd and 4th Toe Amputation;  Surgeon: [name], MD;  Location: [facility] OR;  Service: Orthopedics;  Laterality: Right;   CHOLECYSTECTOMY  03/2011   EYE SURGERY     lazer   foot surgery  11/2007   small toe on left foot removed.   NASAL SEPTUM SURGERY  1981   Due to broken nose. Surgery was to repair how bone healed.   PARS PLANA VITRECTOMY  11/17/2011   Procedure: PARS PLANA VITRECTOMY WITH 23 GAUGE;  Surgeon: Shade Flood, MD;  Location: [facility] OR;  Service:  Ophthalmology;  Laterality: Right;  With Endolaser   TOE AMPUTATION Left    5 th   TONSILLECTOMY     Social History   Occupational History   Not on file  Tobacco Use   Smoking status: Former    Years: 5.00    Pack years: 0.00    Types: Cigarettes    Quit date: 03/25/1979    Years since quitting: 41.7   Smokeless tobacco: Never  Vaping Use  Vaping Use: Never used  Substance and Sexual Activity   Alcohol use: Yes    Comment: occasional  24 pk of malt liqour a month   Drug use: No   Sexual activity: Not on file

## 2020-11-28 ENCOUNTER — Ambulatory Visit
Admission: RE | Admit: 2020-11-28 | Discharge: 2020-11-28 | Disposition: A | Discharge status: Home / Self Care | Payer: Medicaid Other | Source: Ambulatory Visit | Attending: Orthopedic Surgery | Admitting: Orthopedic Surgery

## 2020-11-28 ENCOUNTER — Other Ambulatory Visit: Payer: Self-pay

## 2020-11-28 ENCOUNTER — Telehealth: Payer: Self-pay | Admitting: Radiology

## 2020-11-28 DIAGNOSIS — M86272 Subacute osteomyelitis, left ankle and foot: Secondary | ICD-10-CM

## 2020-11-28 NOTE — Telephone Encounter (Signed)
[facility] Radiology called, please have Dr. Lajoyce Corners review his MRI of the Heel that was done this morning.

## 2020-11-28 NOTE — Telephone Encounter (Signed)
Dr. Lajoyce Corners notified will hold pending advisement of next steps.

## 2020-11-28 NOTE — Telephone Encounter (Signed)
Pt has an appt tomorrow to review results.

## 2020-11-29 ENCOUNTER — Ambulatory Visit (INDEPENDENT_AMBULATORY_CARE_PROVIDER_SITE_OTHER): Payer: Medicaid Other | Admitting: Orthopedic Surgery

## 2020-11-29 ENCOUNTER — Encounter (HOSPITAL_COMMUNITY): Payer: Self-pay | Admitting: Orthopedic Surgery

## 2020-11-29 ENCOUNTER — Encounter: Payer: Self-pay | Admitting: Orthopedic Surgery

## 2020-11-29 ENCOUNTER — Other Ambulatory Visit: Payer: Self-pay

## 2020-11-29 ENCOUNTER — Other Ambulatory Visit: Payer: Self-pay | Admitting: Physician Assistant

## 2020-11-29 DIAGNOSIS — M86272 Subacute osteomyelitis, left ankle and foot: Secondary | ICD-10-CM

## 2020-11-29 DIAGNOSIS — L97524 Non-pressure chronic ulcer of other part of left foot with necrosis of bone: Secondary | ICD-10-CM

## 2020-11-29 NOTE — Progress Notes (Addendum)
[name] denies chest pain or shortness of breath. Patient denies s/s of Covid and has not been in contact with anyone with Covid to his knowledge.  [name] has type II diabetes, states that CBG was 74 on 11/28/20- the only number he would give me.  PCP is Dr. Fleet Contras, patient reported that last A1C was low and Lantus dose was decreased to 20 units at HS. I instructed patient to take 10 units of Lantus Insulin tonight.  I instructed patient to check CBG after awaking and every 2 hours until arrival  to the hospital.  I Instructed patient if CBG is less than 70 to take 4 Glucose Tablets or 1 tube of Glucose Gel or 1/2 cup of a clear juice.  [name] does not have juice and does not have anyone to bring it to him, I instructed patient to call if pre- op desk at [phone] for instructions.  I'm requesting records from Dr. Concepcion Elk' soffice.

## 2020-11-29 NOTE — Progress Notes (Signed)
Office Visit Note   Patient: [name]           Date of Birth: January 03, 1961           MRN: [id number] Visit Date: 11/29/2020              Requested by: Fleet Contras, MD [address] [place],  [place] [address] PCP: Fleet Contras, MD  No chief complaint on file.     HPI: Patient is a 60 year old gentleman who presents in follow-up for left heel ulcer that has acutely become significantly worse.  Patient underwent an MRI scan.  Assessment & Plan: Visit Diagnoses:  1. Subacute osteomyelitis of left foot (HCC)   2. 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  Patient states he would like to proceed with surgery as soon as possible we will set this up for tomorrow Friday.  Follow-Up Instructions: Return in about 2 weeks (around 12/13/2020).   Ortho Exam  Patient is alert, oriented, no adenopathy, well-dressed, normal affect, normal respiratory effort. Examination patient has a necrotic ulcer over the calcaneus there is exposed bone.  Review of the MRI scan shows increased edema in the calcaneus and the cuboid consistent with acute osteomyelitis.  Patient does have pitting edema in his legs with venous insufficiency.  Imaging: No results found. No images are attached to the encounter.  Labs: Lab Results  Component Value Date   HGBA1C 7.1 (H) 09/24/2016   HGBA1C 6.8 (H) 06/17/2014   HGBA1C (H) 11/26/2006    10.4 (NOTE)   The ADA recommends the following therapeutic goals for glycemic   control related to Hgb A1C measurement:   Goal of Therapy:   < 7.0% Hgb A1C   Action Suggested:  > 8.0% Hgb A1C   Ref:  Diabetes Care, 22, Suppl. 1, 1999   ESRSEDRATE 45 (H) 06/17/2014   CRP 1.5 (H) 06/17/2014    REPTSTATUS 06/19/2014 FINAL 06/17/2014   GRAMSTAIN  06/17/2014    RARE WBC PRESENT, PREDOMINANTLY PMN NO SQUAMOUS EPITHELIAL CELLS SEEN NO ORGANISMS SEEN Performed at [facility]    CULT  06/17/2014    MULTIPLE ORGANISMS PRESENT, NONE PREDOMINANT Note: NO STAPHYLOCOCCUS AUREUS ISOLATED NO GROUP A STREP (S.PYOGENES) ISOLATED Performed at [facility]    LABORGA STAPHYLOCOCCUS AUREUS 11/16/2007   LABORGA MORGANELLA MORGANII 11/16/2007     Lab Results  Component Value Date   ALBUMIN 3.4 (L) 07/27/2015   ALBUMIN 3.3 (L) 04/20/2015   ALBUMIN 3.6 06/17/2014    Lab Results  Component Value Date   MG 1.8 11/26/2007   No results found for: VD25OH  No results found for: PREALBUMIN CBC EXTENDED Latest Ref Rng & Units 01/07/2017 09/24/2016 07/27/2015  WBC 4.0 - 10.5 K/uL 9.9 8.5 8.9  RBC 4.22 - 5.81 MIL/uL 5.07 4.89 5.08  HGB 13.0 - 17.0 g/dL 12.9(L) 12.5(L) 12.7(L)  HCT 39.0 - 52.0 % 40.6 39.8 41.1  PLT 150 - 400 K/uL 231 251 202  NEUTROABS 1.7 - 7.7 K/uL - - -  LYMPHSABS 0.7 - 4.0 K/uL - - -     There is no height or weight on file to calculate BMI.  Orders:  No orders of the defined types were placed  in this encounter.  No orders of the defined types were placed in this encounter.    Procedures: No procedures performed  Clinical Data: No additional findings.  ROS:  All other systems negative, except as noted in the HPI. Review of Systems  Objective: Vital Signs: There were no vitals taken for this visit.  Specialty Comments:  No specialty comments available.  PMFS History: Patient Active Problem List   Diagnosis Date Noted   Acute osteomyelitis of toe, right (HCC)    Diabetic polyneuropathy associated with type 2 diabetes mellitus (HCC) 08/14/2016   Chronic venous hypertension (idiopathic) with inflammation of bilateral lower extremity 08/14/2016   Ulcer of left heel, limited to breakdown of skin (HCC) 05/15/2016   Amputated toe of right  foot (HCC) 07/27/2015   Great toe pain 06/17/2014   Type II diabetes mellitus with peripheral circulatory disorder (HCC) 06/17/2014   Elevated transaminase level 06/17/2014   HTN (hypertension) 06/17/2014   Visual disturbance    Past Medical History:  Diagnosis Date   Blood transfusion 2009   Diabetes mellitus    type 2   Hyperlipemia    Hypertension    denies   Leg swelling    Osteomyelitis (HCC)    right 5th toe   Visual disturbance     Family History  Problem Relation Age of Onset   Cancer Mother        breast    Past Surgical History:  Procedure Laterality Date   AMPUTATION Left 2012   Left Great Toe   AMPUTATION Right 04/20/2015   Procedure: Right Great Toe Amputation;  Surgeon: [name], MD;  Location: [facility] OR;  Service: Orthopedics;  Laterality: Right;   AMPUTATION Right 07/27/2015   Procedure: Right 2nd Toe Amputation;  Surgeon: [name], MD;  Location: [facility] OR;  Service: Orthopedics;  Laterality: Right;   AMPUTATION Right 09/24/2016   Procedure: Right 5th Toe Amputation;  Surgeon: [name], MD;  Location: [facility] OR;  Service: Orthopedics;  Laterality: Right;   AMPUTATION Right 01/07/2017   Procedure: Right Foot 3rd and 4th Toe Amputation;  Surgeon: [name], MD;  Location: [facility] OR;  Service: Orthopedics;  Laterality: Right;   CHOLECYSTECTOMY  03/2011   EYE SURGERY     lazer   foot surgery  11/2007   small toe on left foot removed.   NASAL SEPTUM SURGERY  1981   Due to broken nose. Surgery was to repair how bone healed.   PARS PLANA VITRECTOMY  11/17/2011   Procedure: PARS PLANA VITRECTOMY WITH 23 GAUGE;  Surgeon: Shade Flood, MD;  Location: [facility] OR;  Service: Ophthalmology;  Laterality: Right;  With Endolaser   TOE AMPUTATION Left    5 th   TONSILLECTOMY     Social History   Occupational History   Not on file  Tobacco Use   Smoking status: Former    Years: 5.00    Pack years: 0.00    Types: Cigarettes    Quit date: 03/25/1979    Years since  quitting: 41.7   Smokeless tobacco: Never  Vaping Use   Vaping Use: Never used  Substance and Sexual Activity   Alcohol use: Yes    Comment: occasional  24 pk of malt liqour a month   Drug use: No   Sexual activity: Not on file

## 2020-11-30 ENCOUNTER — Encounter (HOSPITAL_COMMUNITY): Payer: Self-pay | Admitting: Orthopedic Surgery

## 2020-11-30 ENCOUNTER — Inpatient Hospital Stay (HOSPITAL_COMMUNITY)
Admission: RE | Admit: 2020-11-30 | Discharge: 2020-12-13 | DRG: 617 | Disposition: A | Discharge status: Skilled Nursing Facility | Payer: Medicaid Other | Attending: Orthopedic Surgery | Admitting: Orthopedic Surgery

## 2020-11-30 ENCOUNTER — Inpatient Hospital Stay (HOSPITAL_COMMUNITY): Payer: Medicaid Other | Admitting: Anesthesiology

## 2020-11-30 ENCOUNTER — Other Ambulatory Visit: Payer: Self-pay

## 2020-11-30 ENCOUNTER — Encounter (HOSPITAL_COMMUNITY)
Admission: RE | Disposition: A | Discharge status: Skilled Nursing Facility | Payer: Self-pay | Source: Home / Self Care | Attending: Orthopedic Surgery

## 2020-11-30 DIAGNOSIS — R339 Retention of urine, unspecified: Secondary | ICD-10-CM | POA: Diagnosis not present

## 2020-11-30 DIAGNOSIS — E1169 Type 2 diabetes mellitus with other specified complication: Principal | ICD-10-CM | POA: Diagnosis present

## 2020-11-30 DIAGNOSIS — I10 Essential (primary) hypertension: Secondary | ICD-10-CM | POA: Diagnosis present

## 2020-11-30 DIAGNOSIS — M86179 Other acute osteomyelitis, unspecified ankle and foot: Secondary | ICD-10-CM | POA: Diagnosis present

## 2020-11-30 DIAGNOSIS — Z6837 Body mass index (BMI) 37.0-37.9, adult: Secondary | ICD-10-CM | POA: Diagnosis not present

## 2020-11-30 DIAGNOSIS — Z87891 Personal history of nicotine dependence: Secondary | ICD-10-CM

## 2020-11-30 DIAGNOSIS — Z89412 Acquired absence of left great toe: Secondary | ICD-10-CM

## 2020-11-30 DIAGNOSIS — M71072 Abscess of bursa, left ankle and foot: Secondary | ICD-10-CM | POA: Diagnosis present

## 2020-11-30 DIAGNOSIS — E11621 Type 2 diabetes mellitus with foot ulcer: Secondary | ICD-10-CM | POA: Diagnosis present

## 2020-11-30 DIAGNOSIS — M86171 Other acute osteomyelitis, right ankle and foot: Secondary | ICD-10-CM

## 2020-11-30 DIAGNOSIS — Z20822 Contact with and (suspected) exposure to covid-19: Secondary | ICD-10-CM | POA: Diagnosis present

## 2020-11-30 DIAGNOSIS — M86272 Subacute osteomyelitis, left ankle and foot: Secondary | ICD-10-CM

## 2020-11-30 DIAGNOSIS — I872 Venous insufficiency (chronic) (peripheral): Secondary | ICD-10-CM | POA: Diagnosis present

## 2020-11-30 DIAGNOSIS — Z9049 Acquired absence of other specified parts of digestive tract: Secondary | ICD-10-CM

## 2020-11-30 DIAGNOSIS — E785 Hyperlipidemia, unspecified: Secondary | ICD-10-CM | POA: Diagnosis present

## 2020-11-30 DIAGNOSIS — Z89421 Acquired absence of other right toe(s): Secondary | ICD-10-CM | POA: Diagnosis not present

## 2020-11-30 DIAGNOSIS — E1152 Type 2 diabetes mellitus with diabetic peripheral angiopathy with gangrene: Secondary | ICD-10-CM | POA: Diagnosis present

## 2020-11-30 DIAGNOSIS — E669 Obesity, unspecified: Secondary | ICD-10-CM | POA: Diagnosis present

## 2020-11-30 DIAGNOSIS — L97524 Non-pressure chronic ulcer of other part of left foot with necrosis of bone: Secondary | ICD-10-CM | POA: Diagnosis present

## 2020-11-30 HISTORY — PX: AMPUTATION: SHX166

## 2020-11-30 LAB — GLUCOSE, CAPILLARY
Glucose-Capillary: 102 mg/dL — ABNORMAL HIGH (ref 70–99)
Glucose-Capillary: 98 mg/dL (ref 70–99)
Glucose-Capillary: 91 mg/dL (ref 70–99)
Glucose-Capillary: 157 mg/dL — ABNORMAL HIGH (ref 70–99)

## 2020-11-30 LAB — BASIC METABOLIC PANEL
Anion gap: 9 (ref 5–15)
BUN: 22 mg/dL (ref 8–23)
CO2: 28 mmol/L (ref 22–32)
Calcium: 8.5 mg/dL — ABNORMAL LOW (ref 8.9–10.3)
Chloride: 102 mmol/L (ref 98–111)
Creatinine, Ser: 1.64 mg/dL — ABNORMAL HIGH (ref 0.61–1.24)
GFR, Estimated: 47 mL/min — ABNORMAL LOW (ref >60)
Glucose, Bld: 92 mg/dL (ref 70–99)
Potassium: 3.4 mmol/L — ABNORMAL LOW (ref 3.5–5.1)
Sodium: 139 mmol/L (ref 135–145)

## 2020-11-30 LAB — CBC
HCT: 35.7 % — ABNORMAL LOW (ref 39.0–52.0)
Hemoglobin: 11.1 g/dL — ABNORMAL LOW (ref 13.0–17.0)
MCH: 26.2 pg (ref 26.0–34.0)
MCHC: 31.1 g/dL (ref 30.0–36.0)
MCV: 84.4 fL (ref 80.0–100.0)
Platelets: 286 10*3/uL (ref 150–400)
RBC: 4.23 MIL/uL (ref 4.22–5.81)
RDW: 13.3 % (ref 11.5–15.5)
WBC: 10.7 10*3/uL — ABNORMAL HIGH (ref 4.0–10.5)
nRBC: 0 % (ref 0.0–0.2)

## 2020-11-30 LAB — TYPE AND SCREEN
ABO/RH(D): AB POS
Antibody Screen: NEGATIVE

## 2020-11-30 LAB — SARS CORONAVIRUS 2 BY RT PCR (HOSPITAL ORDER, PERFORMED IN ~~LOC~~ HOSPITAL LAB): SARS Coronavirus 2: NEGATIVE

## 2020-11-30 SURGERY — AMPUTATION BELOW KNEE
Anesthesia: Regional | Site: Knee | Laterality: Left

## 2020-11-30 MED ORDER — ORAL CARE MOUTH RINSE: 15.0000 mL | Freq: Once | OROMUCOSAL | Status: AC

## 2020-11-30 MED ORDER — ASCORBIC ACID 500 MG PO TABS
1000.0000 mg | ORAL_TABLET | Freq: Every day | ORAL | Status: DC
  Administered 2020-11-30 – 2020-12-13 (×14): 1000 mg via ORAL
  Filled 2020-11-30 (×14): qty 2

## 2020-11-30 MED ORDER — BISACODYL 5 MG PO TBEC
5.0000 mg | DELAYED_RELEASE_TABLET | Freq: Every day | ORAL | Status: DC | PRN
  Administered 2020-12-03: 5 mg via ORAL
  Filled 2020-11-30: qty 1

## 2020-11-30 MED ORDER — LACTATED RINGERS IV SOLN: INTRAVENOUS | Status: DC

## 2020-11-30 MED ORDER — MIDAZOLAM HCL 2 MG/2ML IJ SOLN
INTRAMUSCULAR | Status: AC
  Administered 2020-11-30: 2 mg via INTRAVENOUS
  Filled 2020-11-30: qty 2

## 2020-11-30 MED ORDER — CEFAZOLIN SODIUM-DEXTROSE 2-4 GM/100ML-% IV SOLN
2.0000 g | INTRAVENOUS | Status: AC
  Administered 2020-11-30: 2 g via INTRAVENOUS

## 2020-11-30 MED ORDER — TRANEXAMIC ACID-NACL 1000-0.7 MG/100ML-% IV SOLN
1000.0000 mg | Freq: Once | INTRAVENOUS | Status: DC
  Filled 2020-11-30: qty 100

## 2020-11-30 MED ORDER — LIDOCAINE 2% (20 MG/ML) 5 ML SYRINGE
INTRAMUSCULAR | Status: DC | PRN
  Administered 2020-11-30: 30 mg via INTRAVENOUS

## 2020-11-30 MED ORDER — OXYCODONE HCL 5 MG PO TABS: 5.0000 mg | ORAL_TABLET | Freq: Once | ORAL | Status: DC | PRN

## 2020-11-30 MED ORDER — FENTANYL CITRATE (PF) 250 MCG/5ML IJ SOLN
INTRAMUSCULAR | Status: AC
  Filled 2020-11-30: qty 5

## 2020-11-30 MED ORDER — INSULIN ASPART 100 UNIT/ML IJ SOLN
0.0000 [IU] | Freq: Three times a day (TID) | INTRAMUSCULAR | Status: DC
  Administered 2020-12-01: 2 [IU] via SUBCUTANEOUS
  Administered 2020-12-01 (×2): 3 [IU] via SUBCUTANEOUS
  Administered 2020-12-02: 2 [IU] via SUBCUTANEOUS
  Administered 2020-12-02 – 2020-12-03 (×2): 3 [IU] via SUBCUTANEOUS
  Administered 2020-12-03 – 2020-12-04 (×5): 2 [IU] via SUBCUTANEOUS
  Administered 2020-12-05 – 2020-12-06 (×5): 3 [IU] via SUBCUTANEOUS
  Administered 2020-12-06: 2 [IU] via SUBCUTANEOUS
  Administered 2020-12-07: 3 [IU] via SUBCUTANEOUS
  Administered 2020-12-07 (×2): 2 [IU] via SUBCUTANEOUS
  Administered 2020-12-08 (×3): 3 [IU] via SUBCUTANEOUS
  Administered 2020-12-09: 2 [IU] via SUBCUTANEOUS
  Administered 2020-12-09 – 2020-12-10 (×4): 3 [IU] via SUBCUTANEOUS
  Administered 2020-12-10: 2 [IU] via SUBCUTANEOUS
  Administered 2020-12-11 (×2): 3 [IU] via SUBCUTANEOUS
  Administered 2020-12-11: 2 [IU] via SUBCUTANEOUS
  Administered 2020-12-12 (×2): 3 [IU] via SUBCUTANEOUS
  Administered 2020-12-12 – 2020-12-13 (×3): 2 [IU] via SUBCUTANEOUS

## 2020-11-30 MED ORDER — OXYCODONE HCL 5 MG PO TABS
5.0000 mg | ORAL_TABLET | ORAL | Status: DC | PRN
Start: 2020-11-30 — End: 2020-12-13

## 2020-11-30 MED ORDER — MIDAZOLAM HCL 2 MG/2ML IJ SOLN
INTRAMUSCULAR | Status: AC
  Filled 2020-11-30: qty 2

## 2020-11-30 MED ORDER — JUVEN PO PACK
1.0000 | PACK | Freq: Two times a day (BID) | ORAL | Status: DC
  Administered 2020-12-01 – 2020-12-13 (×26): 1 via ORAL
  Filled 2020-11-30 (×19): qty 1

## 2020-11-30 MED ORDER — FENTANYL CITRATE (PF) 100 MCG/2ML IJ SOLN
INTRAMUSCULAR | Status: AC
  Administered 2020-11-30: 100 ug via INTRAVENOUS
  Filled 2020-11-30: qty 2

## 2020-11-30 MED ORDER — ONDANSETRON HCL 4 MG/2ML IJ SOLN: 4.0000 mg | Freq: Four times a day (QID) | INTRAMUSCULAR | Status: DC | PRN

## 2020-11-30 MED ORDER — ZINC SULFATE 220 (50 ZN) MG PO CAPS
220.0000 mg | ORAL_CAPSULE | Freq: Every day | ORAL | Status: AC
  Administered 2020-11-30 – 2020-12-13 (×14): 220 mg via ORAL
  Filled 2020-11-30 (×14): qty 1

## 2020-11-30 MED ORDER — PANTOPRAZOLE SODIUM 40 MG PO TBEC
40.0000 mg | DELAYED_RELEASE_TABLET | Freq: Every day | ORAL | Status: DC
  Administered 2020-11-30 – 2020-12-13 (×14): 40 mg via ORAL
  Filled 2020-11-30 (×4): qty 1
  Filled 2020-11-30: qty 2
  Filled 2020-11-30 (×10): qty 1

## 2020-11-30 MED ORDER — POLYVINYL ALCOHOL 1.4 % OP SOLN
1.0000 [drp] | Freq: Every day | OPHTHALMIC | Status: DC | PRN
  Filled 2020-11-30: qty 15

## 2020-11-30 MED ORDER — ALUM & MAG HYDROXIDE-SIMETH 200-200-20 MG/5ML PO SUSP: 15.0000 mL | ORAL | Status: DC | PRN

## 2020-11-30 MED ORDER — LISINOPRIL 5 MG PO TABS
5.0000 mg | ORAL_TABLET | Freq: Every day | ORAL | Status: DC
  Administered 2020-12-01 – 2020-12-13 (×13): 5 mg via ORAL
  Filled 2020-11-30 (×13): qty 1

## 2020-11-30 MED ORDER — PHENOL 1.4 % MT LIQD: 1.0000 | OROMUCOSAL | Status: DC | PRN

## 2020-11-30 MED ORDER — INSULIN GLARGINE 100 UNIT/ML ~~LOC~~ SOLN
15.0000 [IU] | Freq: Every day | SUBCUTANEOUS | Status: DC
  Administered 2020-12-01 – 2020-12-12 (×12): 15 [IU] via SUBCUTANEOUS
  Filled 2020-11-30 (×13): qty 0.15

## 2020-11-30 MED ORDER — ROPIVACAINE HCL 5 MG/ML IJ SOLN
INTRAMUSCULAR | Status: DC | PRN
  Administered 2020-11-30: 50 mL via PERINEURAL

## 2020-11-30 MED ORDER — HYDRALAZINE HCL 20 MG/ML IJ SOLN: 5.0000 mg | INTRAMUSCULAR | Status: DC | PRN

## 2020-11-30 MED ORDER — DOCUSATE SODIUM 100 MG PO CAPS
100.0000 mg | ORAL_CAPSULE | Freq: Every day | ORAL | Status: DC
  Administered 2020-12-01 – 2020-12-13 (×13): 100 mg via ORAL
  Filled 2020-11-30 (×13): qty 1

## 2020-11-30 MED ORDER — FENTANYL CITRATE (PF) 100 MCG/2ML IJ SOLN: 100.0000 ug | Freq: Once | INTRAMUSCULAR | Status: AC

## 2020-11-30 MED ORDER — ACETAMINOPHEN 325 MG PO TABS
325.0000 mg | ORAL_TABLET | Freq: Four times a day (QID) | ORAL | Status: DC | PRN
  Filled 2020-11-30: qty 2

## 2020-11-30 MED ORDER — HYDROMORPHONE HCL 1 MG/ML IJ SOLN: 0.5000 mg | INTRAMUSCULAR | Status: DC | PRN

## 2020-11-30 MED ORDER — CEFAZOLIN SODIUM-DEXTROSE 2-4 GM/100ML-% IV SOLN
INTRAVENOUS | Status: AC
  Filled 2020-11-30: qty 100

## 2020-11-30 MED ORDER — CHLORHEXIDINE GLUCONATE 0.12 % MT SOLN: 15.0000 mL | Freq: Once | OROMUCOSAL | Status: AC

## 2020-11-30 MED ORDER — OXYCODONE HCL 5 MG/5ML PO SOLN: 5.0000 mg | Freq: Once | ORAL | Status: DC | PRN

## 2020-11-30 MED ORDER — METOPROLOL TARTRATE 5 MG/5ML IV SOLN: 2.0000 mg | INTRAVENOUS | Status: DC | PRN

## 2020-11-30 MED ORDER — LABETALOL HCL 5 MG/ML IV SOLN: 10.0000 mg | INTRAVENOUS | Status: DC | PRN

## 2020-11-30 MED ORDER — POLYETHYLENE GLYCOL 3350 17 G PO PACK
17.0000 g | PACK | Freq: Every day | ORAL | Status: DC | PRN
  Administered 2020-12-03 – 2020-12-04 (×2): 17 g via ORAL
  Filled 2020-11-30 (×2): qty 1

## 2020-11-30 MED ORDER — PROMETHAZINE HCL 25 MG/ML IJ SOLN: 6.2500 mg | INTRAMUSCULAR | Status: DC | PRN

## 2020-11-30 MED ORDER — TAMSULOSIN HCL 0.4 MG PO CAPS
0.4000 mg | ORAL_CAPSULE | Freq: Every day | ORAL | Status: DC
  Administered 2020-12-01 – 2020-12-13 (×13): 0.4 mg via ORAL
  Filled 2020-11-30 (×13): qty 1

## 2020-11-30 MED ORDER — GUAIFENESIN-DM 100-10 MG/5ML PO SYRP: 15.0000 mL | ORAL_SOLUTION | ORAL | Status: DC | PRN

## 2020-11-30 MED ORDER — SIMVASTATIN 20 MG PO TABS
10.0000 mg | ORAL_TABLET | Freq: Every day | ORAL | Status: DC
  Administered 2020-11-30 – 2020-12-12 (×13): 10 mg via ORAL
  Filled 2020-11-30 (×13): qty 1

## 2020-11-30 MED ORDER — ASPIRIN 81 MG PO CHEW
81.0000 mg | CHEWABLE_TABLET | Freq: Every day | ORAL | Status: DC
  Administered 2020-12-01 – 2020-12-13 (×13): 81 mg via ORAL
  Filled 2020-11-30 (×13): qty 1

## 2020-11-30 MED ORDER — VITAMIN D 25 MCG (1000 UNIT) PO TABS
2000.0000 [IU] | ORAL_TABLET | Freq: Every day | ORAL | Status: DC
  Administered 2020-11-30 – 2020-12-13 (×14): 2000 [IU] via ORAL
  Filled 2020-11-30 (×14): qty 2

## 2020-11-30 MED ORDER — AMISULPRIDE (ANTIEMETIC) 5 MG/2ML IV SOLN: 10.0000 mg | Freq: Once | INTRAVENOUS | Status: DC | PRN

## 2020-11-30 MED ORDER — FENTANYL CITRATE (PF) 250 MCG/5ML IJ SOLN
INTRAMUSCULAR | Status: DC | PRN
  Administered 2020-11-30 (×5): 25 ug via INTRAVENOUS
  Administered 2020-11-30: 75 ug via INTRAVENOUS
  Administered 2020-11-30: 50 ug via INTRAVENOUS

## 2020-11-30 MED ORDER — CHLORHEXIDINE GLUCONATE 0.12 % MT SOLN
OROMUCOSAL | Status: AC
  Administered 2020-11-30: 15 mL via OROMUCOSAL
  Filled 2020-11-30: qty 15

## 2020-11-30 MED ORDER — ONDANSETRON HCL 4 MG/2ML IJ SOLN
INTRAMUSCULAR | Status: DC | PRN
  Administered 2020-11-30: 4 mg via INTRAVENOUS

## 2020-11-30 MED ORDER — MIDAZOLAM HCL 2 MG/2ML IJ SOLN: 2.0000 mg | Freq: Once | INTRAMUSCULAR | Status: AC

## 2020-11-30 MED ORDER — HYDROMORPHONE HCL 1 MG/ML IJ SOLN: 0.2500 mg | INTRAMUSCULAR | Status: DC | PRN

## 2020-11-30 MED ORDER — PROPOFOL 10 MG/ML IV BOLUS
INTRAVENOUS | Status: DC | PRN
  Administered 2020-11-30: 200 mg via INTRAVENOUS

## 2020-11-30 MED ORDER — MAGNESIUM CITRATE PO SOLN: 1.0000 | Freq: Once | ORAL | Status: DC | PRN

## 2020-11-30 MED ORDER — CEFAZOLIN SODIUM-DEXTROSE 2-4 GM/100ML-% IV SOLN
2.0000 g | Freq: Three times a day (TID) | INTRAVENOUS | Status: AC
  Administered 2020-11-30 – 2020-12-01 (×2): 2 g via INTRAVENOUS
  Filled 2020-11-30 (×2): qty 100

## 2020-11-30 MED ORDER — SODIUM CHLORIDE 0.9 % IV SOLN: INTRAVENOUS | Status: DC

## 2020-11-30 MED ORDER — PROPOFOL 10 MG/ML IV BOLUS
INTRAVENOUS | Status: AC
  Filled 2020-11-30: qty 20

## 2020-11-30 SURGICAL SUPPLY — 41 items
BLADE SAW RECIP 87.9 MT (BLADE) ×2 IMPLANT
BLADE SAW SGTL HD 18.5X60.5X1. (BLADE) ×2 IMPLANT
BLADE SURG 21 STRL SS (BLADE) ×2 IMPLANT
BNDG COHESIVE 6X5 TAN STRL LF (GAUZE/BANDAGES/DRESSINGS) IMPLANT
CANISTER WOUND CARE 500ML ATS (WOUND CARE) ×2 IMPLANT
COVER SURGICAL LIGHT HANDLE (MISCELLANEOUS) ×2 IMPLANT
COVER WAND RF STERILE (DRAPES) IMPLANT
CUFF TOURN SGL QUICK 34 (TOURNIQUET CUFF) ×2
CUFF TRNQT CYL 34X4.125X (TOURNIQUET CUFF) ×1 IMPLANT
DRAPE DERMATAC (DRAPES) ×4 IMPLANT
DRAPE INCISE IOBAN 66X45 STRL (DRAPES) ×2 IMPLANT
DRAPE U-SHAPE 47X51 STRL (DRAPES) ×2 IMPLANT
DRESSING PREVENA PLUS CUSTOM (GAUZE/BANDAGES/DRESSINGS) ×1 IMPLANT
DRSG PREVENA PLUS CUSTOM (GAUZE/BANDAGES/DRESSINGS) ×2
DURAPREP 26ML APPLICATOR (WOUND CARE) ×2 IMPLANT
ELECT REM PT RETURN 9FT ADLT (ELECTROSURGICAL) ×2
ELECTRODE REM PT RTRN 9FT ADLT (ELECTROSURGICAL) ×1 IMPLANT
GLOVE BIOGEL PI IND STRL 9 (GLOVE) ×1 IMPLANT
GLOVE BIOGEL PI INDICATOR 9 (GLOVE) ×1
GLOVE SURG ORTHO 9.0 STRL STRW (GLOVE) ×2 IMPLANT
GOWN STRL REUS W/ TWL XL LVL3 (GOWN DISPOSABLE) ×2 IMPLANT
GOWN STRL REUS W/TWL XL LVL3 (GOWN DISPOSABLE) ×4
KIT BASIN OR (CUSTOM PROCEDURE TRAY) ×2 IMPLANT
KIT TURNOVER KIT B (KITS) ×2 IMPLANT
MANIFOLD NEPTUNE II (INSTRUMENTS) ×2 IMPLANT
NS IRRIG 1000ML POUR BTL (IV SOLUTION) ×2 IMPLANT
PACK ORTHO EXTREMITY (CUSTOM PROCEDURE TRAY) ×2 IMPLANT
PAD ARMBOARD 7.5X6 YLW CONV (MISCELLANEOUS) ×2 IMPLANT
PREVENA RESTOR ARTHOFORM 46X30 (CANNISTER) ×2 IMPLANT
PREVENA RESTOR AXIOFORM 29X28 (GAUZE/BANDAGES/DRESSINGS) ×2 IMPLANT
SPONGE LAP 18X18 RF (DISPOSABLE) IMPLANT
SPONGE T-LAP 4X18 ~~LOC~~+RFID (SPONGE) ×2 IMPLANT
STAPLER VISISTAT 35W (STAPLE) IMPLANT
STOCKINETTE IMPERVIOUS LG (DRAPES) ×2 IMPLANT
SUT ETHILON 2 0 PSLX (SUTURE) IMPLANT
SUT SILK 2 0 (SUTURE) ×2
SUT SILK 2-0 18XBRD TIE 12 (SUTURE) ×1 IMPLANT
SUT VIC AB 1 CTX 27 (SUTURE) ×4 IMPLANT
TOWEL GREEN STERILE (TOWEL DISPOSABLE) ×2 IMPLANT
TUBE CONNECTING 12X1/4 (SUCTIONS) ×2 IMPLANT
YANKAUER SUCT BULB TIP NO VENT (SUCTIONS) ×2 IMPLANT

## 2020-11-30 NOTE — Op Note (Signed)
   Date of Surgery: 11/30/2020  INDICATIONS: Mr. Uppal is a 60 y.o.-year-old male who presents with osteomyelitis and ulceration left heel.  PREOPERATIVE DIAGNOSIS: osteomyelitis and ulceration left heel  POSTOPERATIVE DIAGNOSIS: Same.  PROCEDURE: Transtibial amputation Application of Prevena wound VAC  SURGEON: Lajoyce Corners, M.D.  ANESTHESIA:  general  IV FLUIDS AND URINE: See anesthesia records.  ESTIMATED BLOOD LOSS: See anesthesia records.  COMPLICATIONS: None.  DESCRIPTION OF PROCEDURE: The patient was brought to the operating room after undergoing regional anesthetic. After adequate levels of anesthesia were obtained patient's lower extremity was prepped using DuraPrep draped into a sterile field. A timeout was called. The foot was draped out of the sterile field with impervious stockinette. A transverse incision was made 11 cm distal to the tibial tubercle. This curved proximally and a large posterior flap was created. The tibia was transected 1 cm proximal to the skin incision. The fibula was transected just proximal to the tibial incision. The tibia was beveled anteriorly. A large posterior flap was created. The sciatic nerve was pulled cut and allowed to retract. The vascular bundles were suture ligated with 2-0 silk. The deep and superficial fascial layers were closed using #1 Vicryl. The skin was closed using staples and 2-0 nylon. The wound was covered with a Prevena customizable and arthroform wound VAC.  The dressing was sealed with dermatac there was a good suction fit. A prosthetic shrinker will be applied in patient's room. Patient was taken to the PACU in stable condition.   DISCHARGE PLANNING:  Antibiotic duration: 24 hours  Weightbearing: Nonweightbearing on the operative extremity  Pain medication: Opioid pathway  Dressing care/ Wound VAC: Continue wound VAC for 1 week after discharge  Discharge to: Discharge planning based on therapy's recommendations for possible  inpatient rehabilitation, outpatient rehabilitation, or discharge to home with therapy  Follow-up: In the office 1 week post operative.  Aldean Baker, MD [facility] 5:04 PM

## 2020-11-30 NOTE — Interval H&P Note (Signed)
History and Physical Interval Note:  11/30/2020 1:59 PM  [name]  has presented today for surgery, with the diagnosis of Left Foot Osteomyelitis.  The various methods of treatment have been discussed with the patient and family. After consideration of risks, benefits and other options for treatment, the patient has consented to  Procedure(s): LEFT BELOW KNEE AMPUTATION (Left) as a surgical intervention.  The patient's history has been reviewed, patient examined, no change in status, stable for surgery.  I have reviewed the patient's chart and labs.  Questions were answered to the patient's satisfaction.     [name]

## 2020-11-30 NOTE — Anesthesia Procedure Notes (Signed)
Procedure Name: LMA Insertion Date/Time: 11/30/2020 4:15 PM Performed by: Lonia Mad, CRNA Pre-anesthesia Checklist: Patient identified, Emergency Drugs available, Suction available and Patient being monitored Patient Re-evaluated:Patient Re-evaluated prior to induction Oxygen Delivery Method: Circle System Utilized Preoxygenation: Pre-oxygenation with 100% oxygen Induction Type: IV induction Ventilation: Mask ventilation without difficulty LMA: LMA inserted LMA Size: 5.0 Number of attempts: 1 Airway Equipment and Method: Bite block Placement Confirmation: positive ETCO2 Tube secured with: Tape Dental Injury: Teeth and Oropharynx as per pre-operative assessment

## 2020-11-30 NOTE — Anesthesia Procedure Notes (Signed)
Anesthesia Regional Block: Popliteal block   Pre-Anesthetic Checklist: , timeout performed,  Correct Patient, Correct Site, Correct Laterality,  Correct Procedure, Correct Position, site marked,  Risks and benefits discussed,  Surgical consent,  Pre-op evaluation,  At surgeon's request and post-op pain management  Laterality: Left  Prep: chloraprep       Needles:  Injection technique: Single-shot  Needle Type: Stimiplex     Needle Length: 9cm  Needle Gauge: 21     Additional Needles:   Procedures:,,,, ultrasound used (permanent image in chart),,    Narrative:  Start time: 11/30/2020 4:04 PM End time: 11/30/2020 4:09 PM Injection made incrementally with aspirations every 5 mL.  Performed by: Personally  Anesthesiologist: Lowella Curb, MD

## 2020-11-30 NOTE — Progress Notes (Signed)
Patient's money taken down to security in secure envelope. Total of $102.00 (Ten 10 dollar bills and Two 1 dollar  bills).

## 2020-11-30 NOTE — H&P (Signed)
[name] is an 60 y.o. male.   Chief Complaint: Left heel Osteomyelitis HPI:  HPI: Patient is a 60 year old gentleman who presents in follow-up for left heel ulcer that has acutely become significantly worse.  Patient underwent an MRI scan.   Past Medical History:  Diagnosis Date   Blood transfusion 2009   Diabetes mellitus    type 2   Hyperlipemia    Hypertension    denies   Leg swelling    Osteomyelitis (HCC)    right 5th toe   Visual disturbance     Past Surgical History:  Procedure Laterality Date   AMPUTATION Left 2012   Left Great Toe   AMPUTATION Right 04/20/2015   Procedure: Right Great Toe Amputation;  Surgeon: [name], MD;  Location: [facility] OR;  Service: Orthopedics;  Laterality: Right;   AMPUTATION Right 07/27/2015   Procedure: Right 2nd Toe Amputation;  Surgeon: [name], MD;  Location: [facility] OR;  Service: Orthopedics;  Laterality: Right;   AMPUTATION Right 09/24/2016   Procedure: Right 5th Toe Amputation;  Surgeon: [name], MD;  Location: [facility] OR;  Service: Orthopedics;  Laterality: Right;   AMPUTATION Right 01/07/2017   Procedure: Right Foot 3rd and 4th Toe Amputation;  Surgeon: [name], MD;  Location: [facility] OR;  Service: Orthopedics;  Laterality: Right;   CHOLECYSTECTOMY  03/2011   EYE SURGERY     lazer   foot surgery  11/2007   small toe on left foot removed.   NASAL SEPTUM SURGERY  1981   Due to broken nose. Surgery was to repair how bone healed.   PARS PLANA VITRECTOMY  11/17/2011   Procedure: PARS PLANA VITRECTOMY WITH 23 GAUGE;  Surgeon: Shade Flood, MD;  Location: [facility] OR;  Service: Ophthalmology;  Laterality: Right;  With Endolaser   TOE AMPUTATION Left    5 th   TONSILLECTOMY      Family History  Problem Relation Age of Onset   Cancer Mother        breast   Social History:  reports that he quit smoking about 41 years ago. His smoking use included cigarettes. He has never used smokeless tobacco. He reports previous alcohol use. He  reports that he does not use drugs.  Allergies:  Allergies  Allergen Reactions   No Known Allergies     No medications prior to admission.    No results found for this or any previous visit (from the past 48 hour(s)). MR HEEL LEFT WO CONTRAST  Result Date: 11/28/2020 CLINICAL DATA:  Left heel pain.  Diabetic foot wound. EXAM: MR OF THE LEFT HEEL WITHOUT CONTRAST TECHNIQUE: Multiplanar, multisequence MR imaging of the left heel was performed. No intravenous contrast was administered. COMPARISON:  X-ray 11/01/2020 FINDINGS: Large soft tissue ulceration underlying the plantar aspect of the calcaneal body with ulcer base extending to the cortex. Subtle erosion of the plantar aspect of the calcaneal body with bone marrow edema throughout the anterior and mid calcaneus with patchy low T1 marrow signal changes compatible with acute osteomyelitis (series 7, images 15-20). Mild marrow edema within the proximal cuboid adjacent to the calcaneocuboid joint with subtle intermediate T1 signal changes concerning for early acute osteomyelitis. The remaining osseous structures are intact without additional site of bony erosion or marrow replacement. No fracture or dislocation. Pes planus alignment with midfoot arthropathy. No joint effusions. Distal Achilles tendinosis with enthesopathic changes and ossification within the distal tendon. Remaining tendinous structures about the ankle remain grossly  intact. No tenosynovitis. Fatty atrophy of the visualized musculature. No acute ligamentous abnormality. Prominent circumferential subcutaneous edema. No organized fluid collection. IMPRESSION: 1. Large soft tissue ulceration underlying the plantar aspect of the hindfoot with acute osteomyelitis of the calcaneal body. 2. Findings concerning for early acute osteomyelitis within the adjacent cuboid. 3. Distal Achilles tendinosis. 4. Pes planus alignment with midfoot arthropathy. These results will be called to the ordering  clinician or representative by the Radiologist Assistant, and communication documented in the PACS or [employer]. Electronically Signed   By: Duanne Guess D.O.   On: 11/28/2020 08:13    Review of Systems  All other systems reviewed and are negative.  Height 5\' 9"  (1.753 m), weight 115.7 kg. Physical Exam  Patient is alert, oriented, no adenopathy, well-dressed, normal affect, normal respiratory effort. Examination patient has a necrotic ulcer over the calcaneus there is exposed bone.  Review of the MRI scan shows increased edema in the calcaneus and the cuboid consistent with acute osteomyelitis.  Patient does have pitting edema in his legs with venous insufficiency.Heart RRR Lungs clear Assessment/Plan 1. Subacute osteomyelitis of left foot (HCC)  2. 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  Patient states he would like to proceed with surgery as soon as possible we will set this up for tomorrow Friday.  Tuesday Aydee Mcnew, PA 11/30/2020, 7:01 AM

## 2020-11-30 NOTE — Anesthesia Preprocedure Evaluation (Signed)
Anesthesia Evaluation  Patient identified by MRN, date of birth, ID band Patient awake    Reviewed: Allergy & Precautions, NPO status , Patient's Chart, lab work & pertinent test results  Airway Mallampati: II  TM Distance: >3 FB Neck ROM: Full    Dental no notable dental hx.    Pulmonary former smoker,    Pulmonary exam normal breath sounds clear to auscultation       Cardiovascular hypertension, Pt. on medications + Peripheral Vascular Disease  Normal cardiovascular exam Rhythm:Regular Rate:Normal     Neuro/Psych  Neuromuscular disease    GI/Hepatic negative GI ROS, Neg liver ROS,   Endo/Other  diabetes  Renal/GU negative Renal ROS     Musculoskeletal   Abdominal (+) + obese,   Peds  Hematology   Anesthesia Other Findings   Reproductive/Obstetrics                             Anesthesia Physical  Anesthesia Plan  ASA: III  Anesthesia Plan: Regional and General   Post-op Pain Management:  Regional for Post-op pain   Induction: Intravenous  PONV Risk Score and Plan: 2 and Ondansetron, Midazolam and Treatment may vary due to age or medical condition  Airway Management Planned: LMA  Additional Equipment:   Intra-op Plan:   Post-operative Plan: Extubation in OR  Informed Consent: I have reviewed the patients History and Physical, chart, labs and discussed the procedure including the risks, benefits and alternatives for the proposed anesthesia with the patient or authorized representative who has indicated his/her understanding and acceptance.     Dental advisory given  Plan Discussed with: Anesthesiologist and CRNA  Anesthesia Plan Comments:         Anesthesia Quick Evaluation

## 2020-11-30 NOTE — Anesthesia Procedure Notes (Signed)
Anesthesia Regional Block: Adductor canal block   Pre-Anesthetic Checklist: , timeout performed,  Correct Patient, Correct Site, Correct Laterality,  Correct Procedure, Correct Position, site marked,  Risks and benefits discussed,  Surgical consent,  Pre-op evaluation,  At surgeon's request and post-op pain management  Laterality: Left  Prep: chloraprep       Needles:  Injection technique: Single-shot  Needle Type: Stimiplex     Needle Length: 9cm  Needle Gauge: 21     Additional Needles:   Procedures:,,,, ultrasound used (permanent image in chart),,    Narrative:  Start time: 11/30/2020 4:03 PM End time: 11/30/2020 4:08 PM Injection made incrementally with aspirations every 5 mL.  Performed by: Personally  Anesthesiologist: Lowella Curb, MD

## 2020-11-30 NOTE — Transfer of Care (Signed)
Immediate Anesthesia Transfer of Care Note  Patient: [name]  Procedure(s) Performed: LEFT BELOW KNEE AMPUTATION (Left: Knee)  Patient Location: PACU  Anesthesia Type:General  Level of Consciousness: drowsy and responds to stimulation  Airway & Oxygen Therapy: Patient Spontanous Breathing and Patient connected to nasal cannula oxygen, OPA  Post-op Assessment: Report given to RN and Post -op Vital signs reviewed and stable  Post vital signs: Reviewed and stable  Last Vitals:  Vitals Value Taken Time  BP 118/53 11/30/20 1649  Temp    Pulse 87 11/30/20 1650  Resp 10 11/30/20 1650  SpO2 100 % 11/30/20 1650  Vitals shown include unvalidated device data.  Last Pain:  Vitals:   11/30/20 1333  TempSrc:   PainSc: 5       Patients Stated Pain Goal: 2 (11/30/20 1333)  Complications: No notable events documented.

## 2020-12-01 LAB — BASIC METABOLIC PANEL
Anion gap: 7 (ref 5–15)
BUN: 18 mg/dL (ref 8–23)
CO2: 29 mmol/L (ref 22–32)
Calcium: 8 mg/dL — ABNORMAL LOW (ref 8.9–10.3)
Chloride: 101 mmol/L (ref 98–111)
Creatinine, Ser: 1.49 mg/dL — ABNORMAL HIGH (ref 0.61–1.24)
GFR, Estimated: 53 mL/min — ABNORMAL LOW (ref >60)
Glucose, Bld: 172 mg/dL — ABNORMAL HIGH (ref 70–99)
Potassium: 3.6 mmol/L (ref 3.5–5.1)
Sodium: 137 mmol/L (ref 135–145)

## 2020-12-01 LAB — CBC
HCT: 29.8 % — ABNORMAL LOW (ref 39.0–52.0)
Hemoglobin: 9.5 g/dL — ABNORMAL LOW (ref 13.0–17.0)
MCH: 26.1 pg (ref 26.0–34.0)
MCHC: 31.9 g/dL (ref 30.0–36.0)
MCV: 81.9 fL (ref 80.0–100.0)
Platelets: 278 10*3/uL (ref 150–400)
RBC: 3.64 MIL/uL — ABNORMAL LOW (ref 4.22–5.81)
RDW: 13.2 % (ref 11.5–15.5)
WBC: 9.6 10*3/uL (ref 4.0–10.5)
nRBC: 0 % (ref 0.0–0.2)

## 2020-12-01 LAB — GLUCOSE, CAPILLARY
Glucose-Capillary: 167 mg/dL — ABNORMAL HIGH (ref 70–99)
Glucose-Capillary: 155 mg/dL — ABNORMAL HIGH (ref 70–99)
Glucose-Capillary: 158 mg/dL — ABNORMAL HIGH (ref 70–99)
Glucose-Capillary: 125 mg/dL — ABNORMAL HIGH (ref 70–99)
Glucose-Capillary: 203 mg/dL — ABNORMAL HIGH (ref 70–99)

## 2020-12-01 LAB — HEMOGLOBIN A1C
Hgb A1c MFr Bld: 6.4 % — ABNORMAL HIGH (ref 4.8–5.6)
Mean Plasma Glucose: 137 mg/dL

## 2020-12-01 NOTE — Progress Notes (Signed)
Orthopedic Tech Progress Note Patient Details:  [name] 11/26/1960 [id number] Placed order for Vive Protocol BK Prosthetic with [facility]  Patient ID: [name], male   DOB: 1961/01/27, 60 y.o.   MRN: [id number]  [name] 12/01/2020, 8:40 AM

## 2020-12-01 NOTE — Progress Notes (Signed)
Inpatient Diabetes Program Recommendations  AACE/ADA: New Consensus Statement on Inpatient Glycemic Control (2015)  Target Ranges:  Prepandial:   less than 140 mg/dL      Peak postprandial:   less than 180 mg/dL (1-2 hours)      Critically ill patients:  140 - 180 mg/dL   Lab Results  Component Value Date   GLUCAP 155 (H) 12/01/2020   HGBA1C 6.4 (H) 11/30/2020    Review of Glycemic Control Results for [name], [name] (MRN [id number]) as of 12/01/2020 10:10  Ref. Range 11/30/2020 21:07 12/01/2020 06:48 12/01/2020 08:03  Glucose-Capillary Latest Ref Range: 70 - 99 mg/dL 263 (H) 335 (H) 456 (H)   Diabetes history: DM 2 Outpatient Diabetes medications:  Lantus 20 units q HS Current orders for Inpatient glycemic control:  Novolog moderate tid with meals Lantus 15 units q HS Inpatient Diabetes Program Recommendations:    Referral received.  Agree with current orders.   Thanks,  Beryl Meager, RN, BC-ADM Inpatient Diabetes Coordinator Pager [phone] (8a-5p)

## 2020-12-01 NOTE — Progress Notes (Signed)
Patient ID: [name], male   DOB: Sep 20, 1960, 60 y.o.   MRN: [id number] Patient is postoperative day 1 left below the knee amputation.  The wound VAC has no drainage patient does have some pain a Foley catheter was placed due to urinary retention.  Plan for discharge to home on Monday pending physical therapy recommendations.  Please continue with Foley today.

## 2020-12-01 NOTE — Evaluation (Signed)
Occupational Therapy Evaluation Patient Details Name: [name] MRN: [id number] DOB: 10-18-1960 Today's Date: 12/01/2020    History of Present Illness Patient presented to the hospital with a left heel ulcer. 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 PMH: right trans met amputation, DMII, unspecified visual impariment   Clinical Impression   Pt presented in chair and reported they can not sit up any longer due to discomfort. Pt was able to complete sit to stand with min guard and use of RW. Pt requires max to total assist following BM for hygiene in standing position. Pt was able to ambulate to bed with min guard and RW. Pt completed bed mobility supervision.  Pt currently with functional limitations due to the deficits listed below (see OT Problem List).  Pt will benefit from skilled acute OT to increase their safety and independence with ADL and functional mobility for ADL to facilitate discharge to venue listed below.      Follow Up Recommendations  SNF;Supervision/Assistance - 24 hour    Equipment Recommendations       Recommendations for Other Services       Precautions / Restrictions Precautions Precautions: None Restrictions Weight Bearing Restrictions: No LLE Weight Bearing: Non weight bearing      Mobility Bed Mobility Overal bed mobility: Needs Assistance Bed Mobility: Sit to Supine       Sit to supine: Min guard   General bed mobility comments: min a to sit up in bed    Transfers Overall transfer level: Needs assistance Equipment used: Rolling walker (2 wheeled) Transfers: Sit to/from Stand Sit to Stand: Min guard         General transfer comment: cues for hand placement    Balance Overall balance assessment: Needs assistance Sitting-balance support: Single extremity supported Sitting balance-Leahy Scale: Good     Standing balance support: Bilateral upper extremity supported Standing balance-Leahy Scale: Poor                              ADL either performed or assessed with clinical judgement   ADL Overall ADL's : Needs assistance/impaired Eating/Feeding: Independent;Sitting   Grooming: Wash/dry hands;Wash/dry face;Set up;Sitting   Upper Body Bathing: Min guard;Sitting   Lower Body Bathing: Maximal assistance;Sit to/from stand   Upper Body Dressing : Min guard;Sitting   Lower Body Dressing: Maximal assistance;Sit to/from stand   Toilet Transfer: Min guard;Cueing for safety;Cueing for sequencing;BSC   Toileting- Water quality scientist and Hygiene: Maximal assistance;Sit to/from stand       Functional mobility during ADLs: Min guard;Cueing for safety;Cueing for sequencing;Rolling walker General ADL Comments: Pt requires cues on positioning     Vision Baseline Vision/History: Wears glasses       Perception     Praxis      Pertinent Vitals/Pain Pain Assessment: Faces Pain Score: 5  Faces Pain Scale: Hurts little more Pain Location: left residual limb and reported coyx hurts from sitting Pain Descriptors / Indicators: Aching Pain Intervention(s): Limited activity within patient's tolerance     Hand Dominance Right   Extremity/Trunk Assessment Upper Extremity Assessment Upper Extremity Assessment: Overall WFL for tasks assessed   Lower Extremity Assessment Lower Extremity Assessment: Defer to PT evaluation LLE Deficits / Details: has active movement of the left residual limb   Cervical / Trunk Assessment Cervical / Trunk Assessment: Normal   Communication Communication Communication: No difficulties   Cognition Arousal/Alertness: Awake/alert Behavior During Therapy: WFL for tasks assessed/performed Overall Cognitive Status:  Within Functional Limits for tasks assessed                                     General Comments  hads protection over the limb    Exercises     Shoulder Instructions      Home Living Family/patient expects to be discharged to:: Private  residence Living Arrangements: Alone   Type of Home: House Home Access: Level entry                         Additional Comments: Patient reports no steps at home      Prior Functioning/Environment Level of Independence: Independent with assistive device(s)        Comments: Patient reports he used a walker at home        OT Problem List: Decreased strength;Decreased range of motion;Decreased activity tolerance;Impaired balance (sitting and/or standing);Decreased knowledge of use of DME or AE;Decreased knowledge of precautions;Pain      OT Treatment/Interventions: Self-care/ADL training;Therapeutic exercise;Energy conservation;DME and/or AE instruction;Therapeutic activities;Patient/family education;Balance training    OT Goals(Current goals can be found in the care plan section) Acute Rehab OT Goals Patient Stated Goal: to go home OT Goal Formulation: With patient Time For Goal Achievement: 12/08/20 Potential to Achieve Goals: Good  OT Frequency: Min 2X/week   Barriers to D/C:            Co-evaluation              AM-PAC OT "6 Clicks" Daily Activity     Outcome Measure Help from another person eating meals?: None Help from another person taking care of personal grooming?: A Little Help from another person toileting, which includes using toliet, bedpan, or urinal?: A Lot Help from another person bathing (including washing, rinsing, drying)?: A Lot Help from another person to put on and taking off regular upper body clothing?: A Little Help from another person to put on and taking off regular lower body clothing?: A Lot 6 Click Score: 16   End of Session Equipment Utilized During Treatment: Gait belt;Rolling walker  Activity Tolerance: Patient tolerated treatment well Patient left: in bed;with call bell/phone within reach;with nursing/sitter in room  OT Visit Diagnosis: Unsteadiness on feet (R26.81);Other abnormalities of gait and mobility  (R26.89);Muscle weakness (generalized) (M62.81)                Time: 0929-5747 OT Time Calculation (min): 30 min Charges:  OT General Charges $OT Visit: 1 Visit OT Evaluation $OT Eval Low Complexity: 1 Low OT Treatments $Self Care/Home Management : 8-22 mins  Joeseph Amor OTR/L  Acute Rehab Services  [phone] office number [phone] pager number   Joeseph Amor 12/01/2020, 12:45 PM

## 2020-12-01 NOTE — Plan of Care (Signed)
  Problem: Clinical Measurements: Goal: Respiratory complications will improve Outcome: Progressing   Problem: Health Behavior/Discharge Planning: Goal: Ability to manage health-related needs will improve Outcome: Progressing   Problem: Activity: Goal: Risk for activity intolerance will decrease Outcome: Progressing   Problem: Nutrition: Goal: Adequate nutrition will be maintained Outcome: Progressing   Problem: Pain Managment: Goal: General experience of comfort will improve Outcome: Progressing   Problem: Safety: Goal: Ability to remain free from injury will improve Outcome: Progressing   Problem: Skin Integrity: Goal: Risk for impaired skin integrity will decrease Outcome: Progressing   Problem: Clinical Measurements: Goal: Will remain free from infection Outcome: Progressing

## 2020-12-01 NOTE — Evaluation (Signed)
Physical Therapy Evaluation Patient Details Name: [name] MRN: [id number] DOB: [birth year] Today's Date: 12/01/2020   History of Present Illness  Patient presented to the hospital with a left heel ulcer. 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 PMH: right trans met amputation, DMII, unspecified visual impariment  Clinical Impression  Patient required assistance and cuing for mobility and safety with all transfers. He required mod cuing to get into a good position to sit. He is motivated to go home but realizes he may need rehab to improve mobility. He lives alone and will not have much help. PT will continue to work with him while he is admitted.     Follow Up Recommendations SNF    Equipment Recommendations  Rolling walker with 5" wheels    Recommendations for Other Services Rehab consult     Precautions / Restrictions Precautions Precautions: None Restrictions Weight Bearing Restrictions: No      Mobility  Bed Mobility Overal bed mobility: Needs Assistance Bed Mobility: Supine to Sit           General bed mobility comments: min a to sit up in bed    Transfers Overall transfer level: Needs assistance Equipment used: Rolling walker (2 wheeled) Transfers: Sit to/from Stand Sit to Stand: Min assist         General transfer comment: min a for strength and balance  Ambulation/Gait Ambulation/Gait assistance: Min assist Gait Distance (Feet): 3 Feet Assistive device: Rolling walker (2 wheeled) Gait Pattern/deviations: Step-to pattern Gait velocity: decreased Gait velocity interpretation: <1.31 ft/sec, indicative of household ambulator General Gait Details: was able to hop to the chair. Patient required mod cuing not to sit before he got to the chair. He required min a for strength and balance  Stairs            Wheelchair Mobility    Modified Rankin (Stroke Patients Only)       Balance                                              Pertinent Vitals/Pain Pain Assessment: 0-10 Pain Score: 5  Pain Location: left residual limb Pain Descriptors / Indicators: Aching Pain Intervention(s): Limited activity within patient's tolerance    Home Living Family/patient expects to be discharged to:: Private residence Living Arrangements: Alone   Type of Home: House Home Access: Level entry         Additional Comments: Patient reports no steps at home    Prior Function Level of Independence: Independent with assistive device(s)         Comments: Patient reports he used a walker at home     Hand Dominance   Dominant Hand: Right    Extremity/Trunk Assessment   Upper Extremity Assessment Upper Extremity Assessment: Overall WFL for tasks assessed    Lower Extremity Assessment Lower Extremity Assessment: LLE deficits/detail LLE Deficits / Details: has active movement of the left residual limb    Cervical / Trunk Assessment Cervical / Trunk Assessment: Normal  Communication   Communication: No difficulties  Cognition Arousal/Alertness: Awake/alert Behavior During Therapy: WFL for tasks assessed/performed Overall Cognitive Status: Within Functional Limits for tasks assessed  General Comments General comments (skin integrity, edema, etc.): hads protection over the limb    Exercises     Assessment/Plan    PT Assessment    PT Problem List         PT Treatment Interventions      PT Goals (Current goals can be found in the Care Plan section)  Acute Rehab PT Goals Patient Stated Goal: to go home PT Goal Formulation: With patient Time For Goal Achievement:  (w+1) Potential to Achieve Goals: Good    Frequency     Barriers to discharge        Co-evaluation               AM-PAC PT "6 Clicks" Mobility  Outcome Measure Help needed turning from your back to your side while in a flat bed without using bedrails?: A Little Help  needed moving from lying on your back to sitting on the side of a flat bed without using bedrails?: A Little Help needed moving to and from a bed to a chair (including a wheelchair)?: A Lot Help needed standing up from a chair using your arms (e.g., wheelchair or bedside chair)?: A Lot Help needed to walk in hospital room?: A Lot Help needed climbing 3-5 steps with a railing? : Total 6 Click Score: 13    End of Session Equipment Utilized During Treatment: Gait belt Activity Tolerance: No increased pain Patient left: in chair        Time: 1005-1030 PT Time Calculation (min) (ACUTE ONLY): 25 min   Charges:   PT Evaluation $PT Eval Moderate Complexity: 1 Mod            Carney Living PT DPT  12/01/2020, 11:03 AM

## 2020-12-02 LAB — CBC
HCT: 30.1 % — ABNORMAL LOW (ref 39.0–52.0)
Hemoglobin: 9.2 g/dL — ABNORMAL LOW (ref 13.0–17.0)
MCH: 25.3 pg — ABNORMAL LOW (ref 26.0–34.0)
MCHC: 30.6 g/dL (ref 30.0–36.0)
MCV: 82.7 fL (ref 80.0–100.0)
Platelets: 271 10*3/uL (ref 150–400)
RBC: 3.64 MIL/uL — ABNORMAL LOW (ref 4.22–5.81)
RDW: 13.3 % (ref 11.5–15.5)
WBC: 9.7 10*3/uL (ref 4.0–10.5)
nRBC: 0 % (ref 0.0–0.2)

## 2020-12-02 LAB — BASIC METABOLIC PANEL
Anion gap: 5 (ref 5–15)
BUN: 15 mg/dL (ref 8–23)
CO2: 30 mmol/L (ref 22–32)
Calcium: 7.8 mg/dL — ABNORMAL LOW (ref 8.9–10.3)
Chloride: 101 mmol/L (ref 98–111)
Creatinine, Ser: 1.48 mg/dL — ABNORMAL HIGH (ref 0.61–1.24)
GFR, Estimated: 53 mL/min — ABNORMAL LOW (ref >60)
Glucose, Bld: 161 mg/dL — ABNORMAL HIGH (ref 70–99)
Potassium: 3.6 mmol/L (ref 3.5–5.1)
Sodium: 136 mmol/L (ref 135–145)

## 2020-12-02 LAB — GLUCOSE, CAPILLARY
Glucose-Capillary: 132 mg/dL — ABNORMAL HIGH (ref 70–99)
Glucose-Capillary: 116 mg/dL — ABNORMAL HIGH (ref 70–99)
Glucose-Capillary: 155 mg/dL — ABNORMAL HIGH (ref 70–99)
Glucose-Capillary: 163 mg/dL — ABNORMAL HIGH (ref 70–99)

## 2020-12-02 NOTE — TOC Initial Note (Signed)
Transition of Care [facility]) - Initial/Assessment Note    Patient Details  Name: [name] MRN: [id number] Date of Birth: [birth year]  [facility]) CM/SW Contact:    Geralynn Ochs, LCSW Phone Number: 12/02/2020, 11:57 AM  Clinical Narrative:       CSW met with patient to discuss SNF recommendation. Patient in agreement, as he does not have any help at home. Patient said he had been somewhere else over 10 years ago but doesn't remember what place. Patient currently has no preference for SNF placement. CSW faxed out referral, will follow up with patient on bed offers. CSW discussed with patient that there will be limited options that are in network with his insurance.   Patient has not received covid vaccines. CSW discussed with patient that he will have to quarantine upon arriving at [facility].             Expected Discharge Plan: Skilled Nursing Facility Barriers to Discharge: Continued Medical Work up, Ship broker   Patient Goals and CMS Choice Patient states their goals for this hospitalization and ongoing recovery are:: to get better and get back home CMS Medicare.gov Compare Post Acute Care list provided to:: Patient Choice offered to / list presented to : Patient  Expected Discharge Plan and Services Expected Discharge Plan: [place] Choice: [place] Living arrangements for the past 2 months: Apartment                                      Prior Living Arrangements/Services Living arrangements for the past 2 months: Apartment Lives with:: Self Patient language and need for interpreter reviewed:: No Do you feel safe going back to the place where you live?: Yes      Need for Family Participation in Patient Care: No (Comment) Care giver support system in place?: No (comment)   Criminal Activity/Legal Involvement Pertinent to Current Situation/Hospitalization: No - Comment as  needed  Activities of Daily Living Home Assistive Devices/Equipment: Walker (specify type), Blood pressure cuff, Eyeglasses, Grab bars in shower, Grab bars around toilet ADL Screening (condition at time of admission) Patient's cognitive ability adequate to safely complete daily activities?: Yes Is the patient deaf or have difficulty hearing?: No Does the patient have difficulty seeing, even when wearing glasses/contacts?: No Does the patient have difficulty concentrating, remembering, or making decisions?: No Patient able to express need for assistance with ADLs?: Yes Does the patient have difficulty dressing or bathing?: No Independently performs ADLs?: Yes (appropriate for developmental age) Does the patient have difficulty walking or climbing stairs?: Yes Weakness of Legs: Left Weakness of Arms/Hands: None  Permission Sought/Granted Permission sought to share information with : Chartered certified accountant granted to share information with : Yes, Verbal Permission Granted     Permission granted to share info w AGENCY: SNF        Emotional Assessment Appearance:: Appears stated age Attitude/Demeanor/Rapport: Engaged Affect (typically observed): Appropriate Orientation: : Oriented to Self, Oriented to Place, Oriented to  Time, Oriented to Situation Alcohol / Substance Use: Not Applicable Psych Involvement: No (comment)  Admission diagnosis:  Osteomyelitis (HCC) [M86.9] Abscess of bursa of left ankle [M71.072] Patient Active Problem List   Diagnosis Date Noted   Subacute osteomyelitis, left ankle and foot ([place]) 11/30/2020   Abscess of bursa of left ankle 11/30/2020   Acute osteomyelitis of toe,  right [facility])    Diabetic polyneuropathy associated with type 2 diabetes mellitus ([place]) 08/14/2016   Chronic venous hypertension (idiopathic) with inflammation of bilateral lower extremity 08/14/2016   Ulcer of left heel, limited to breakdown of skin ([place]) 05/15/2016    Amputated toe of right foot ([place]) 07/27/2015   Great toe pain 06/17/2014   Type II diabetes mellitus with peripheral circulatory disorder ([place]) 06/17/2014   Elevated transaminase level 06/17/2014   HTN (hypertension) 06/17/2014   Visual disturbance    PCP:  Nolene Ebbs, MD Pharmacy:   [facility], [place] [address] Phone: [phone] Fax: [place], [place] [address] Phone: [phone] Fax: [phone]     Social Determinants of Health (SDOH) Interventions    Readmission Risk Interventions No flowsheet data found.

## 2020-12-02 NOTE — NC FL2 (Signed)
[place] MEDICAID FL2 LEVEL OF CARE SCREENING TOOL     IDENTIFICATION  Patient Name: [name] Birthdate: Jun 12, 1961 Sex: male Admission Date (Current Location): 11/30/2020  [facility] and [place] Number:  Producer, television/film/video and Address:  [facility] [place], [address] [place], [place] [address]      Provider Number: [id number]  Attending Physician Name and Address:  [name], MD  Relative Name and Phone Number:       Current Level of Care: Hospital Recommended Level of Care: Skilled Nursing Facility Prior Approval Number:    Date Approved/Denied:   PASRR Number: Manual review  Discharge Plan: SNF    Current Diagnoses: Patient Active Problem List   Diagnosis Date Noted   Subacute osteomyelitis, left ankle and foot (HCC) 11/30/2020   Abscess of bursa of left ankle 11/30/2020   Acute osteomyelitis of toe, right (HCC)    Diabetic polyneuropathy associated with type 2 diabetes mellitus (HCC) 08/14/2016   Chronic venous hypertension (idiopathic) with inflammation of bilateral lower extremity 08/14/2016   Ulcer of left heel, limited to breakdown of skin (HCC) 05/15/2016   Amputated toe of right foot (HCC) 07/27/2015   Great toe pain 06/17/2014   Type II diabetes mellitus with peripheral circulatory disorder (HCC) 06/17/2014   Elevated transaminase level 06/17/2014   HTN (hypertension) 06/17/2014   Visual disturbance     Orientation RESPIRATION BLADDER Height & Weight     Self, Time, Situation, Place  Normal Incontinent Weight: 256 lb (116.1 kg) Height:  5\' 9"  (175.3 cm)  BEHAVIORAL SYMPTOMS/MOOD NEUROLOGICAL BOWEL NUTRITION STATUS      Continent Diet (carb modified)  AMBULATORY STATUS COMMUNICATION OF NEEDS Skin   Limited Assist Verbally Surgical wounds, Wound Vac (closed left leg, Prevena wound vac until ortho follow up)                       Personal Care Assistance Level of Assistance  Bathing, Feeding, Dressing Bathing  Assistance: Limited assistance Feeding assistance: Independent Dressing Assistance: Limited assistance     Functional Limitations Info             SPECIAL CARE FACTORS FREQUENCY  PT (By licensed PT), OT (By licensed OT)     PT Frequency: 5x/wk OT Frequency: 5x/wk            Contractures Contractures Info: Not present    Additional Factors Info  Code Status, Allergies, Insulin Sliding Scale Code Status Info: Full Allergies Info: NKA   Insulin Sliding Scale Info: See DC summary       Current Medications (12/02/2020):  This is the current hospital active medication list Current Facility-Administered Medications  Medication Dose Route Frequency Provider Last Rate Last Admin   0.9 %  sodium chloride infusion   Intravenous Continuous Persons, 12/04/2020, PA 75 mL/hr at 12/02/20 0600 Infusion Verify at 12/02/20 0600   acetaminophen (TYLENOL) tablet 325-650 mg  325-650 mg Oral Q6H PRN Persons, 12/04/20, PA       alum & mag hydroxide-simeth (MAALOX/MYLANTA) 200-200-20 MG/5ML suspension 15-30 mL  15-30 mL Oral Q2H PRN Persons, 10-07-1975, [place]       ascorbic acid (VITAMIN C) tablet 1,000 mg  1,000 mg Oral Daily Persons, [place], PA   1,000 mg at 12/02/20 12/04/20   aspirin chewable tablet 81 mg  81 mg Oral Daily Persons, 2563, PA   81 mg at 12/02/20 0838   bisacodyl (DULCOLAX) EC tablet  5 mg  5 mg Oral Daily PRN Persons, [place], PA       cholecalciferol (VITAMIN D3) tablet 2,000 Units  2,000 Units Oral Daily Persons, [place], [place]   2,000 Units at 12/02/20 3149   docusate sodium (COLACE) capsule 100 mg  100 mg Oral Daily Persons, [place], PA   100 mg at 12/02/20 0959   guaiFENesin-dextromethorphan (ROBITUSSIN DM) 100-10 MG/5ML syrup 15 mL  15 mL Oral Q4H PRN Persons, [place], PA       hydrALAZINE (APRESOLINE) injection 5 mg  5 mg Intravenous Q20 Min PRN Persons, [place], PA       HYDROmorphone (DILAUDID) injection 0.5 mg  0.5 mg Intravenous Q4H PRN Persons, [place], PA        insulin aspart (novoLOG) injection 0-15 Units  0-15 Units Subcutaneous TID WC Persons, [place], PA   2 Units at 12/02/20 0840   insulin glargine (LANTUS) injection 15 Units  15 Units Subcutaneous QHS Persons, [place], [place]   15 Units at 12/01/20 2116   labetalol (NORMODYNE) injection 10 mg  10 mg Intravenous Q10 min PRN Persons, [place], PA       lisinopril (ZESTRIL) tablet 5 mg  5 mg Oral Daily Persons, [place], PA   5 mg at 12/02/20 0839   magnesium citrate solution 1 Bottle  1 Bottle Oral Once PRN Persons, [place], PA       metoprolol tartrate (LOPRESSOR) injection 2-5 mg  2-5 mg Intravenous Q2H PRN Persons, [place], PA       nutrition supplement (JUVEN) (JUVEN) powder packet 1 packet  1 packet Oral BID BM Persons, [place], PA   1 packet at 12/02/20 0840   ondansetron (ZOFRAN) injection 4 mg  4 mg Intravenous Q6H PRN Persons, [place], PA       oxyCODONE (Oxy IR/ROXICODONE) immediate release tablet 5-10 mg  5-10 mg Oral Q4H PRN Persons, [place], PA       pantoprazole (PROTONIX) EC tablet 40 mg  40 mg Oral Daily Persons, [place], PA   40 mg at 12/02/20 0838   phenol (CHLORASEPTIC) mouth spray 1 spray  1 spray Mouth/Throat PRN Persons, [place], PA       polyethylene glycol (MIRALAX / GLYCOLAX) packet 17 g  17 g Oral Daily PRN Persons, [place], PA       polyvinyl alcohol (LIQUIFILM TEARS) 1.4 % ophthalmic solution 1 drop  1 drop Both Eyes Daily PRN Persons, [place], PA       simvastatin (ZOCOR) tablet 10 mg  10 mg Oral QHS Persons, [place], [place]   10 mg at 12/01/20 2115   tamsulosin (FLOMAX) capsule 0.4 mg  0.4 mg Oral Daily Persons, [place], PA   0.4 mg at 12/02/20 0840   tranexamic acid (CYKLOKAPRON) IVPB 1,000 mg  1,000 mg Intravenous Once Persons, [place], PA       zinc sulfate capsule 220 mg  220 mg Oral Daily Persons, [place], PA   220 mg at 12/02/20 7026     Discharge Medications: Please see discharge summary for a list of discharge  medications.  Relevant Imaging Results:  Relevant Lab Results:   Additional Information SS#: [id number]  Baldemar Lenis, LCSW

## 2020-12-02 NOTE — Plan of Care (Signed)
  Problem: Activity: Goal: Risk for activity intolerance will decrease Outcome: Progressing   Problem: Nutrition: Goal: Adequate nutrition will be maintained Outcome: Progressing   Problem: Pain Managment: Goal: General experience of comfort will improve Outcome: Progressing   Problem: Safety: Goal: Ability to remain free from injury will improve Outcome: Progressing   

## 2020-12-02 NOTE — Progress Notes (Signed)
   Subjective: 2 Days Post-Op Procedure(s) (LRB): LEFT BELOW KNEE AMPUTATION (Left) Patient reports pain as mild.  Sleeping when I came into room. " I was up in the chair at 4:30 AM, I am a little tired "  Objective: Vital signs in last 24 hours: Temp:  [98.9 F (37.2 C)] 98.9 F (37.2 C) (06/18 2038) Pulse Rate:  [75-79] 79 (06/18 2038) Resp:  [17-19] 17 (06/18 2038) BP: (119-131)/(57-59) 119/57 (06/18 2038) SpO2:  [94 %-98 %] 98 % (06/18 2038)  Intake/Output from previous day: 06/18 0701 - 06/19 0700 In: 1884.4 [P.O.:240; I.V.:1644.4] Out: 1100 [Urine:1100] Intake/Output this shift: No intake/output data recorded.  Recent Labs    11/30/20 1400 12/01/20 0736 12/02/20 0132  HGB 11.1* 9.5* 9.2*   Recent Labs    12/01/20 0736 12/02/20 0132  WBC 9.6 9.7  RBC 3.64* 3.64*  HCT 29.8* 30.1*  PLT 278 271   Recent Labs    12/01/20 0736 12/02/20 0132  NA 137 136  K 3.6 3.6  CL 101 101  CO2 29 30  BUN 18 15  CREATININE 1.49* 1.48*  GLUCOSE 172* 161*  CALCIUM 8.0* 7.8*   No results for input(s): LABPT, INR in the last 72 hours.  Dressing stump intact.  No results found.  Assessment/Plan: 2 Days Post-Op Procedure(s) (LRB): LEFT BELOW KNEE AMPUTATION (Left) Discharge to SNF when bed available  [name] 12/02/2020, 11:14 AM

## 2020-12-02 NOTE — Progress Notes (Signed)
Inpatient Rehab Admissions Coordinator:  Consult received.  Note therapy evaluations completed and recommendations for SNF. TOC made AC aware that pt is agreeable to SNF.  AC will sign off.   Wolfgang Phoenix, MS, CCC-SLP Admissions Coordinator [phone]

## 2020-12-03 ENCOUNTER — Encounter (HOSPITAL_COMMUNITY): Payer: Self-pay | Admitting: Orthopedic Surgery

## 2020-12-03 LAB — GLUCOSE, CAPILLARY
Glucose-Capillary: 92 mg/dL (ref 70–99)
Glucose-Capillary: 148 mg/dL — ABNORMAL HIGH (ref 70–99)
Glucose-Capillary: 151 mg/dL — ABNORMAL HIGH (ref 70–99)
Glucose-Capillary: 144 mg/dL — ABNORMAL HIGH (ref 70–99)
Glucose-Capillary: 131 mg/dL — ABNORMAL HIGH (ref 70–99)

## 2020-12-03 LAB — SARS CORONAVIRUS 2 (TAT 6-24 HRS): SARS Coronavirus 2: NEGATIVE

## 2020-12-03 MED ORDER — ZINC SULFATE 220 (50 ZN) MG PO CAPS: 220.0000 mg | ORAL_CAPSULE | Freq: Every day | ORAL | Status: DC

## 2020-12-03 MED ORDER — JUVEN PO PACK: 1.0000 | PACK | Freq: Two times a day (BID) | ORAL | 0 refills | Status: DC

## 2020-12-03 MED ORDER — OXYCODONE HCL 5 MG PO TABS: 5.0000 mg | ORAL_TABLET | ORAL | 0 refills | Status: DC | PRN

## 2020-12-03 MED ORDER — ASCORBIC ACID 1000 MG PO TABS: 1000.0000 mg | ORAL_TABLET | Freq: Every day | ORAL | 0 refills | Status: DC

## 2020-12-03 NOTE — Anesthesia Postprocedure Evaluation (Signed)
Anesthesia Post Note  Patient: [name]  Procedure(s) Performed: LEFT BELOW KNEE AMPUTATION (Left: Knee)     Patient location during evaluation: PACU Anesthesia Type: Regional Level of consciousness: awake and alert Pain management: pain level controlled Vital Signs Assessment: post-procedure vital signs reviewed and stable Respiratory status: spontaneous breathing, nonlabored ventilation and respiratory function stable Cardiovascular status: blood pressure returned to baseline and stable Postop Assessment: no apparent nausea or vomiting Anesthetic complications: no   No notable events documented.  Last Vitals:  Vitals:   12/01/20 2038 12/02/20 2145  BP: (!) 119/57 136/64  Pulse: 79 76  Resp: 17 16  Temp: 37.2 C 37.1 C  SpO2: 98% 96%    Last Pain:  Vitals:   12/03/20 0647  TempSrc:   PainSc: 0-No pain                 Lowella Curb

## 2020-12-03 NOTE — TOC Progression Note (Signed)
Transition of Care [facility]) - Progression Note    Patient Details  Name: [name] MRN: [id number] Date of Birth: 01-Apr-1961  Transition of Care [facility]) CM/SW Contact  Levada Schilling Phone Number: 12/03/2020, 1:12 PM  Clinical Narrative:    Currently bed offers pending. No SNF offers.  TOC will continue to assist with disposition planning.   Expected Discharge Plan: Skilled Nursing Facility Barriers to Discharge: Continued Medical Work up, English as a second language teacher  Expected Discharge Plan and Services Expected Discharge Plan: Skilled Nursing Facility     Post Acute Care Choice: Skilled Nursing Facility Living arrangements for the past 2 months: Apartment Expected Discharge Date: 12/03/20                                     Social Determinants of Health (SDOH) Interventions    Readmission Risk Interventions No flowsheet data found.

## 2020-12-03 NOTE — Discharge Summary (Signed)
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 male who was admitted 11/30/2020 with a chief complaint of left foot osteomyelitis with a final diagnosis of Left Foot Osteomyelitis.  Patient was brought to the operating room on 11/30/2020 and underwent Procedure(s): LEFT BELOW KNEE AMPUTATION.    Patient was given perioperative antibiotics:  Anti-infectives (From admission, onward)    Start     Dose/Rate Route Frequency Ordered Stop   11/30/20 2300  ceFAZolin (ANCEF) IVPB 2g/100 mL premix        2 g 200 mL/hr over 30 Minutes Intravenous Every 8 hours 11/30/20 1748 12/01/20 0804   11/30/20 1345  ceFAZolin (ANCEF) IVPB 2g/100 mL premix        2 g 200 mL/hr over 30 Minutes Intravenous On call to O.R. 11/30/20 1257 11/30/20 1616   11/30/20 1311  ceFAZolin (ANCEF) 2-4 GM/100ML-% IVPB       Note to Pharmacy: [facility]   : cabinet override      11/30/20 1311 11/30/20 1622     .  Patient was given sequential compression devices, early ambulation, and aspirin for DVT prophylaxis.  Recent vital signs: Patient Vitals for the past 24 hrs:  BP Temp Temp src Pulse Resp SpO2  12/02/20 2145 136/64 98.7 F (37.1 C) Oral 76 16 96 %  .  Recent laboratory studies: No results found.  Discharge Medications:   Allergies as of 12/03/2020       Reactions   No Known Allergies         Medication List     STOP taking these medications    doxycycline 100 MG tablet Commonly known as: VIBRA-TABS       TAKE these medications    Accu-Chek Aviva Plus test strip Generic drug: glucose blood   Accu-Chek Softclix Lancets lancets   ascorbic acid 1000 MG tablet Commonly known as: VITAMIN C Take 1 tablet (1,000 mg total) by mouth daily.    aspirin 81 MG chewable tablet Chew 81 mg by mouth daily.   BD ULTRA-FINE PEN NEEDLES 29G X 12.7MM Misc Generic drug: Insulin Pen Needle   Lantus SoloStar 100 UNIT/ML Solostar Pen Generic drug: insulin glargine Inject 20 Units into the skin at bedtime.   lisinopril 5 MG tablet Commonly known as: ZESTRIL Take 5 mg by mouth daily.   nutrition supplement (JUVEN) Pack Take 1 packet by mouth 2 (two) times daily between meals.   oxyCODONE 5 MG immediate release tablet Commonly known as: Oxy IR/ROXICODONE Take 1-2 tablets (5-10 mg total) by mouth every 4 (four) hours as needed for moderate pain (pain score 4-6).   simvastatin 10 MG tablet Commonly known as: ZOCOR Take 10 mg by mouth at bedtime.   Systane 0.4-0.3 % Gel ophthalmic gel Generic drug: Polyethyl Glycol-Propyl Glycol Place 1 application into both eyes daily as needed (Dry eye).   tamsulosin 0.4 MG Caps capsule Commonly known as: FLOMAX Take 0.4 mg by mouth daily.   Vitamin D (Ergocalciferol) 50 MCG (2000 UT) Caps Take 2,000 Units by mouth daily.   zinc sulfate 220 (50 Zn) MG capsule Take 1 capsule (220 mg total) by mouth daily.        Diagnostic Studies: MR HEEL LEFT WO CONTRAST  Result Date: 11/28/2020 CLINICAL  DATA:  Left heel pain.  Diabetic foot wound. EXAM: MR OF THE LEFT HEEL WITHOUT CONTRAST TECHNIQUE: Multiplanar, multisequence MR imaging of the left heel was performed. No intravenous contrast was administered. COMPARISON:  X-ray 11/01/2020 FINDINGS: Large soft tissue ulceration underlying the plantar aspect of the calcaneal body with ulcer base extending to the cortex. Subtle erosion of the plantar aspect of the calcaneal body with bone marrow edema throughout the anterior and mid calcaneus with patchy low T1 marrow signal changes compatible with acute osteomyelitis (series 7, images 15-20). Mild marrow edema within the proximal cuboid adjacent to the calcaneocuboid joint with subtle intermediate T1 signal  changes concerning for early acute osteomyelitis. The remaining osseous structures are intact without additional site of bony erosion or marrow replacement. No fracture or dislocation. Pes planus alignment with midfoot arthropathy. No joint effusions. Distal Achilles tendinosis with enthesopathic changes and ossification within the distal tendon. Remaining tendinous structures about the ankle remain grossly intact. No tenosynovitis. Fatty atrophy of the visualized musculature. No acute ligamentous abnormality. Prominent circumferential subcutaneous edema. No organized fluid collection. IMPRESSION: 1. Large soft tissue ulceration underlying the plantar aspect of the hindfoot with acute osteomyelitis of the calcaneal body. 2. Findings concerning for early acute osteomyelitis within the adjacent cuboid. 3. Distal Achilles tendinosis. 4. Pes planus alignment with midfoot arthropathy. These results will be called to the ordering clinician or representative by the Radiologist Assistant, and communication documented in the PACS or [employer]. Electronically Signed   By: Duanne Guess D.O.   On: 11/28/2020 08:13    Patient benefited maximally from their hospital stay and there were no complications.     Disposition: Discharge disposition: 03-Skilled Nursing Facility      Discharge Instructions     Call MD / Call 911   Complete by: As directed    If you experience chest pain or shortness of breath, CALL 911 and be transported to the hospital emergency room.  If you develope a fever above 101 F, pus (white drainage) or increased drainage or redness at the wound, or calf pain, call your surgeon's office.   Constipation Prevention   Complete by: As directed    Drink plenty of fluids.  Prune juice may be helpful.  You may use a stool softener, such as Colace (over the counter) 100 mg twice a day.  Use MiraLax (over the counter) for constipation as needed.   Diet - low sodium heart healthy   Complete  by: As directed    Increase activity slowly as tolerated   Complete by: As directed    Negative Pressure Wound Therapy - Incisional   Complete by: As directed    Show patient how to attach vac   Post-operative opioid taper instructions:   Complete by: As directed    POST-OPERATIVE OPIOID TAPER INSTRUCTIONS: It is important to wean off of your opioid medication as soon as possible. If you do not need pain medication after your surgery it is ok to stop day one. Opioids include: Codeine, Hydrocodone(Norco, Vicodin), Oxycodone(Percocet, oxycontin) and hydromorphone amongst others.  Long term and even short term use of opiods can cause: Increased pain response Dependence Constipation Depression Respiratory depression And more.  Withdrawal symptoms can include Flu like symptoms Nausea, vomiting And more Techniques to manage these symptoms Hydrate well Eat regular healthy meals Stay active Use relaxation techniques(deep breathing, meditating, yoga) Do Not substitute Alcohol to help with tapering If you have been on opioids for less than two weeks and do  not have pain than it is ok to stop all together.  Plan to wean off of opioids This plan should start within one week post op of your joint replacement. Maintain the same interval or time between taking each dose and first decrease the dose.  Cut the total daily intake of opioids by one tablet each day Next start to increase the time between doses. The last dose that should be eliminated is the evening dose.          Follow-up Information     Adonis Huguenin, NP Follow up in 1 week(s).   Specialty: Orthopedic Surgery Contact information: [address] [place] [address] [phone]                  Signed: [place] Macai Sisneros 12/03/2020, 7:19 AM

## 2020-12-03 NOTE — Progress Notes (Signed)
Patient is postop day 3 status post below-knee amputation.  He is alert and awake and comfortable.  He is agreeable to going to skilled nursing.  Vital signs stable afebrile 0 cc in the wound VAC canister which is functioning well with 2 green checks  Plan will place order for COVID test today if not already done plan for skilled nursing discharge as soon as bed available

## 2020-12-03 NOTE — Progress Notes (Signed)
Occupational Therapy Treatment Patient Details Name: [name] MRN: [id number] DOB: 03/20/1961 Today's Date: 12/03/2020    History of present illness Patient presented to the hospital with a left heel ulcer. 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 PMH: right trans met amputation, DMII, unspecified visual impariment   OT comments  Pt making progress with functional goals. Session focused on bed mobility to sit EOB, activity tolerance, LB ADL/selfcare techniques and initiated education about tub transfer bench for home use. OT will continue to follow acutely to maximize level of function and safety  Follow Up Recommendations  SNF;Supervision/Assistance - 24 hour    Equipment Recommendations    Tub bench TBD at SNF   Recommendations for Other Services      Precautions / Restrictions Precautions Precautions: None Restrictions Weight Bearing Restrictions: Yes LLE Weight Bearing: Non weight bearing       Mobility Bed Mobility Overal bed mobility: Needs Assistance Bed Mobility: Sit to Supine       Sit to supine: Min guard   General bed mobility comments: pt sat EOB to eat lunch    Transfers                 General transfer comment: pt politely declined stating that he wants to eat his lunch and that he has been able to walk to bathroom with staff using RW    Balance Overall balance assessment: Needs assistance Sitting-balance support: Single extremity supported Sitting balance-Leahy Scale: Good                                     ADL either performed or assessed with clinical judgement   ADL Overall ADL's : Needs assistance/impaired         Upper Body Bathing: Supervision/ safety;Set up Upper Body Bathing Details (indicate cue type and reason): simulated seated EOB     Upper Body Dressing : Set up;Supervision/safety Upper Body Dressing Details (indicate cue type and reason): donned clean gown seated EOB                   General ADL  Comments: educate dpt on compensatory techniques for LB bathing and dressing; initiated tub transfer bench education with explaination and visual aid     Vision Baseline Vision/History: Wears glasses Patient Visual Report: No change from baseline     Perception     Praxis      Cognition Arousal/Alertness: Awake/alert Behavior During Therapy: WFL for tasks assessed/performed Overall Cognitive Status: Within Functional Limits for tasks assessed                                          Exercises     Shoulder Instructions       General Comments      Pertinent Vitals/ Pain       Pain Assessment: Faces Faces Pain Scale: Hurts a little bit Pain Location: left residual limb Pain Descriptors / Indicators: Aching;Discomfort Pain Intervention(s): Monitored during session;Repositioned  Home Living                                          Prior Functioning/Environment  Frequency  Min 2X/week        Progress Toward Goals  OT Goals(current goals can now be found in the care plan section)  Progress towards OT goals: Progressing toward goals  Acute Rehab OT Goals Patient Stated Goal: to go home OT Goal Formulation: With patient  Plan Discharge plan remains appropriate    Co-evaluation                 AM-PAC OT "6 Clicks" Daily Activity     Outcome Measure   Help from another person eating meals?: None Help from another person taking care of personal grooming?: A Little Help from another person toileting, which includes using toliet, bedpan, or urinal?: A Lot Help from another person bathing (including washing, rinsing, drying)?: A Lot Help from another person to put on and taking off regular upper body clothing?: A Little Help from another person to put on and taking off regular lower body clothing?: A Lot 6 Click Score: 16    End of Session    OT Visit Diagnosis: Unsteadiness on feet (R26.81);Other  abnormalities of gait and mobility (R26.89);Muscle weakness (generalized) (M62.81)   Activity Tolerance Patient tolerated treatment well   Patient Left in bed;with call bell/phone within reach;Other (comment) (sitting EOB eating lunch)   Nurse Communication          Time: 1241-1300 OT Time Calculation (min): 19 min  Charges: OT General Charges $OT Visit: 1 Visit OT Treatments $Self Care/Home Management : 8-22 mins     Emmit Alexanders [facility] 12/03/2020, 3:03 PM

## 2020-12-03 NOTE — Plan of Care (Signed)

## 2020-12-04 LAB — GLUCOSE, CAPILLARY
Glucose-Capillary: 145 mg/dL — ABNORMAL HIGH (ref 70–99)
Glucose-Capillary: 148 mg/dL — ABNORMAL HIGH (ref 70–99)
Glucose-Capillary: 128 mg/dL — ABNORMAL HIGH (ref 70–99)
Glucose-Capillary: 169 mg/dL — ABNORMAL HIGH (ref 70–99)

## 2020-12-04 NOTE — Progress Notes (Addendum)
Physical Therapy Treatment Patient Details Name: [name] MRN: [id number] DOB: 1961/05/19 Today's Date: 12/04/2020    History of Present Illness Patient presented to the hospital with a left heel ulcer. 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 PMH: right trans met amputation ("many years ago"), DMII, unspecified visual impariment.    PT Comments    Pt received in supine, agreeable to therapy session and with good participation and tolerance for gait and transfer training. Reviewed supine HEP program (see link below) and handout given to reinforce, pt performed exercises with good tolerance and able to perform quad set on LLE without difficulty with full knee extension. Pt needing up to minA for transfers and performed short gait trials x2 using RW with min guard, agreeable to sit up in chair. Plan to progress gait distance next session, pt will likely need chair follow for safety due to decreased activity tolerance and impulsivity to sit when fatigued. Frequency updated per discussion with supervising PT Alexa S per pt progress. Pt continues to benefit from PT services to progress toward functional mobility goals.    Follow Up Recommendations  SNF;Supervision for mobility/OOB     Equipment Recommendations  Rolling walker with 5" wheels    Recommendations for Other Services       Precautions / Restrictions Precautions Precautions: Fall Required Braces or Orthoses: Other Brace Other Brace: L limb guard (wear when mobilizing OOB) Restrictions Weight Bearing Restrictions: Yes LLE Weight Bearing: Non weight bearing    Mobility  Bed Mobility Overal bed mobility: Needs Assistance Bed Mobility: Supine to Sit     Supine to sit: Min guard     General bed mobility comments: use of bed features and rails to perform, HOB partially elevated ~20 deg    Transfers Overall transfer level: Needs assistance Equipment used: Rolling walker (2 wheeled) Transfers: Sit to/from Stand;Lateral/Scoot  Transfers Sit to Stand: Min guard;Min assist        Lateral/Scoot Transfers: Supervision General transfer comment: from EOB minA, then from toilet height min guard with wall rail, cues needed for safe hand placement to/from RW; seated scoot from middle to foot of bed ~55ft with Supervision/cues for technique  Ambulation/Gait Ambulation/Gait assistance: Min guard;Min assist Gait Distance (Feet): 15 Feet (x2 to/from toilet) Assistive device: Rolling walker (2 wheeled) Gait Pattern/deviations:  (swing-to pattern)     General Gait Details: cues more for technique/safety awareness, minA at most for safety with initial trial then min guard second trial, no LOB        Balance Overall balance assessment: Needs assistance Sitting-balance support: Single extremity supported Sitting balance-Leahy Scale: Good Sitting balance - Comments: able to sit and scoot/weight shift without LOB   Standing balance support: Bilateral upper extremity supported Standing balance-Leahy Scale: Poor Standing balance comment: reliant on RW, mild LOB initially upon standing but able to correct with RW and min guard from PTA                            Cognition Arousal/Alertness: Awake/alert Behavior During Therapy: WFL for tasks assessed/performed Overall Cognitive Status: Within Functional Limits for tasks assessed                                 General Comments: motivated, cooperative      Warehouse manager Sets: AROM;Left;10 reps;Supine Towel Squeeze: AROM;10 reps;Supine Hip Extension: AROM;Left;5 reps;Supine Hip ABduction/ADduction: AROM;Both;10 reps;Supine  Straight Leg Raises: AROM;Left;10 reps;Supine    General Comments General comments (skin integrity, edema, etc.): VSS on RA, no dizziness reported with transfers; reviewed positioning in bed/chair with straight L limb, not to place pillow under knee, importance of OOB to chair and HEP handout       Pertinent Vitals/Pain Pain Assessment: Faces Faces Pain Scale: Hurts a little bit Pain Location: left residual limb with mobility tasks, denies pain at rest Pain Descriptors / Indicators: Discomfort;Sore Pain Intervention(s): Monitored during session;Premedicated before session;Repositioned     PT Goals (current goals can now be found in the care plan section) Acute Rehab PT Goals Patient Stated Goal: to get stronger then go home PT Goal Formulation: With patient Time For Goal Achievement: 12/12/20 Progress towards PT goals: Progressing toward goals    Frequency    Min 3X/week      PT Plan Current plan remains appropriate    Co-evaluation              AM-PAC PT "6 Clicks" Mobility   Outcome Measure  Help needed turning from your back to your side while in a flat bed without using bedrails?: None Help needed moving from lying on your back to sitting on the side of a flat bed without using bedrails?: A Little Help needed moving to and from a bed to a chair (including a wheelchair)?: A Little Help needed standing up from a chair using your arms (e.g., wheelchair or bedside chair)?: A Little Help needed to walk in hospital room?: A Little Help needed climbing 3-5 steps with a railing? : Total 6 Click Score: 17    End of Session Equipment Utilized During Treatment: Gait belt Activity Tolerance: Patient tolerated treatment well;No increased pain Patient left: in chair;with call bell/phone within reach;Other (comment);with nursing/sitter in room (NT in room to replace condom cath which had fallen off while he hopped to bathroom) Nurse Communication: Mobility status PT Visit Diagnosis: Difficulty in walking, not elsewhere classified (R26.2);Other abnormalities of gait and mobility (R26.89)     Time: 7416-3845 PT Time Calculation (min) (ACUTE ONLY): 42 min  Charges:  $Gait Training: 8-22 mins $Therapeutic Exercise: 8-22 mins $Therapeutic Activity: 8-22 mins                      Lether Tesch P., PTA Acute Rehabilitation Services Pager: [phone] Office: [place] 12/04/2020, 2:21 PM

## 2020-12-04 NOTE — Progress Notes (Signed)
Patient ID: [name], male   DOB: [birth year], 60 y.o.   MRN: [id number] Patient is a 61 year old gentleman status post left transtibial amputation.  There is 50 cc in the wound VAC canister this is unchanged.  Patient has full extension of his knee.  Awaiting offer for skilled nursing placement.

## 2020-12-05 LAB — SURGICAL PATHOLOGY

## 2020-12-05 LAB — GLUCOSE, CAPILLARY
Glucose-Capillary: 161 mg/dL — ABNORMAL HIGH (ref 70–99)
Glucose-Capillary: 175 mg/dL — ABNORMAL HIGH (ref 70–99)
Glucose-Capillary: 169 mg/dL — ABNORMAL HIGH (ref 70–99)
Glucose-Capillary: 165 mg/dL — ABNORMAL HIGH (ref 70–99)

## 2020-12-05 NOTE — Progress Notes (Signed)
Physical Therapy Treatment Patient Details Name: [name] MRN: [id number] DOB: 1961/02/10 Today's Date: 12/05/2020    History of Present Illness Patient presented to the hospital with a left heel ulcer. 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 PMH: right trans met amputation ("many years ago"), DMII, unspecified visual impariment.    PT Comments    Pt received in supine, agreeable to therapy session and with good participation and tolerance for transfer, gait and exercise training. Pt able to perform supine and sidelying LLE exercises with cues for technique per HEP handout and good tolerance. Pt performed transfers and gait with up to minA using RW, pt reports BUE quick to fatigue but able to tolerate increased distance this date. Pt impulsive to sit when fatigued. Pt continues to benefit from PT services to progress toward functional mobility goals.    Follow Up Recommendations  SNF;Supervision for mobility/OOB     Equipment Recommendations  Rolling walker with 5" wheels    Recommendations for Other Services       Precautions / Restrictions Precautions Precautions: Fall Required Braces or Orthoses: Other Brace Other Brace: L limb guard (wear when mobilizing OOB) Restrictions Weight Bearing Restrictions: Yes LLE Weight Bearing: Non weight bearing    Mobility  Bed Mobility Overal bed mobility: Needs Assistance Bed Mobility: Supine to Sit;Sit to Supine;Rolling Rolling: Modified independent (Device/Increase time)   Supine to sit: Min guard Sit to supine: Min guard   General bed mobility comments: use of bed features and rails to perform, HOB partially elevated, increased time to perform.    Transfers Overall transfer level: Needs assistance Equipment used: Rolling walker (2 wheeled) Transfers: Sit to/from Stand;Lateral/Scoot Transfers Sit to Stand: Min guard;Min assist        Lateral/Scoot Transfers: Supervision General transfer comment: from EOB needs minA to stabilize;  min guard for stand>sit back to EOB; pt needs cues needed for safe hand placement to/from RW; seated scoot with Supervision/cues for technique  Ambulation/Gait Ambulation/Gait assistance: Min guard Gait Distance (Feet): 40 Feet Assistive device: Rolling walker (2 wheeled) Gait Pattern/deviations:  (swing-to pattern) Gait velocity: decreased   General Gait Details: cues more for technique/safety awareness, min guard at most for safety, no LOB   Stairs             Wheelchair Mobility    Modified Rankin (Stroke Patients Only)       Balance Overall balance assessment: Needs assistance Sitting-balance support: Single extremity supported Sitting balance-Leahy Scale: Good Sitting balance - Comments: able to sit and scoot/weight shift without LOB   Standing balance support: Bilateral upper extremity supported Standing balance-Leahy Scale: Poor Standing balance comment: reliant on RW, mild LOB initially upon standing but able to correct with RW and min assist from PTA                            Cognition Arousal/Alertness: Awake/alert Behavior During Therapy: [facility] for tasks assessed/performed Overall Cognitive Status: Within Functional Limits for tasks assessed                                 General Comments: motivated, cooperative      Warehouse manager Sets: AROM;Left;10 reps;Supine Gluteal Sets: AROM;10 reps;Supine Towel Squeeze: AROM;10 reps;Supine Hip Extension: AROM;Left;10 reps;Sidelying Hip ABduction/ADduction: AROM;10 reps;Sidelying;Left Hip Flexion/Marching: AROM;Left;10 reps;Supine Knee Flexion: AROM;Left;10 reps;Supine Knee Extension: AROM;Left;10 reps;Supine Chair Push Up:  (verbal review but  pt deferring to perform as he just got back to bed from chair)    General Comments General comments (skin integrity, edema, etc.): VSS on RA, increased WOB with exertion      Pertinent Vitals/Pain Pain Assessment:  Faces Faces Pain Scale: Hurts little more Pain Location: left residual limb with mobility tasks, denies pain at rest Pain Descriptors / Indicators: Discomfort;Sore Pain Intervention(s): Monitored during session;Repositioned    Home Living                      Prior Function            PT Goals (current goals can now be found in the care plan section) Acute Rehab PT Goals Patient Stated Goal: to get stronger then go home PT Goal Formulation: With patient Time For Goal Achievement: 12/12/20 Progress towards PT goals: Progressing toward goals    Frequency    Min 3X/week      PT Plan Current plan remains appropriate    Co-evaluation              AM-PAC PT "6 Clicks" Mobility   Outcome Measure  Help needed turning from your back to your side while in a flat bed without using bedrails?: None Help needed moving from lying on your back to sitting on the side of a flat bed without using bedrails?: A Little Help needed moving to and from a bed to a chair (including a wheelchair)?: A Little Help needed standing up from a chair using your arms (e.g., wheelchair or bedside chair)?: A Little Help needed to walk in hospital room?: A Little Help needed climbing 3-5 steps with a railing? : Total 6 Click Score: 17    End of Session Equipment Utilized During Treatment: Gait belt (limb guard) Activity Tolerance: Patient tolerated treatment well Patient left: with call bell/phone within reach;in bed (instructed on no pillow under LLE) Nurse Communication: Mobility status PT Visit Diagnosis: Difficulty in walking, not elsewhere classified (R26.2);Other abnormalities of gait and mobility (R26.89)     Time: 8127-5170 PT Time Calculation (min) (ACUTE ONLY): 26 min  Charges:  $Gait Training: 8-22 mins $Therapeutic Exercise: 8-22 mins                     Britiney Blahnik P., PTA Acute Rehabilitation Services Pager: [phone] Office: [place] 12/05/2020,  4:46 PM

## 2020-12-05 NOTE — Plan of Care (Signed)
  Problem: Clinical Measurements: Goal: Will remain free from infection Outcome: Progressing Goal: Cardiovascular complication will be avoided Outcome: Progressing   Problem: Activity: Goal: Risk for activity intolerance will decrease Outcome: Progressing   Problem: Nutrition: Goal: Adequate nutrition will be maintained Outcome: Progressing   

## 2020-12-05 NOTE — Progress Notes (Signed)
Patient is status post below-knee amputation on the left.  He is doing well there is about 60 cc of bloody drainage in the [facility] and is functioning.   Patient is agreeable to SNF.  We will discharge to SNF when bed available

## 2020-12-06 LAB — GLUCOSE, CAPILLARY
Glucose-Capillary: 131 mg/dL — ABNORMAL HIGH (ref 70–99)
Glucose-Capillary: 139 mg/dL — ABNORMAL HIGH (ref 70–99)
Glucose-Capillary: 193 mg/dL — ABNORMAL HIGH (ref 70–99)
Glucose-Capillary: 180 mg/dL — ABNORMAL HIGH (ref 70–99)
Glucose-Capillary: 184 mg/dL — ABNORMAL HIGH (ref 70–99)

## 2020-12-06 NOTE — Progress Notes (Signed)
Patient is postop day 6 status post transtibial amputation.  He is doing well and says he is progressing well with physical therapy.   Vital signs stable afebrile wound VAC is working quite well 2 green checks no further drainage in the canister since yesterday.  Hopefully can discharge to nursing facility today.  He will require a new COVID test if does not discharge to skilled nursing today.

## 2020-12-06 NOTE — Plan of Care (Signed)
  Problem: Activity: Goal: Risk for activity intolerance will decrease Outcome: Progressing   Problem: Coping: Goal: Level of anxiety will decrease Outcome: Progressing   Problem: Pain Managment: Goal: General experience of comfort will improve Outcome: Progressing   Problem: Safety: Goal: Ability to remain free from injury will improve Outcome: Progressing   Problem: Skin Integrity: Goal: Risk for impaired skin integrity will decrease Outcome: Progressing   

## 2020-12-06 NOTE — Progress Notes (Signed)
Physical Therapy Treatment Patient Details Name: [name] MRN: [id number] DOB: 15-Apr-1961 Today's Date: 12/06/2020    History of Present Illness Patient presented to the hospital with a left heel ulcer. 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 PMH: right trans met amputation ("many years ago"), DMII, unspecified visual impariment.    PT Comments    The pt was eager and motivated to continue with PT and OOB mobility. He was able to demo good progress with mobility, completing x10 sit-stand transfers on minG level through session from various surfaces, and completing multiple short bouts of ambulation in the room with good management of RW and minG for safety. The pt was able to maintain standing balance with BUE support for 1-2 min at a time, but was unable to safely progress to single UE support. The pt was limited in gait progression by onset of dizziness after prior mobility (sit-stands, gait, and standing balance), and therefore was returned to sitting where dizziness subsided. Continue to recommend post-acute rehab to facilitate return to prior level of mobility and independence.     Follow Up Recommendations  SNF;Supervision for mobility/OOB     Equipment Recommendations  Rolling walker with 5" wheels    Recommendations for Other Services       Precautions / Restrictions Precautions Precautions: Fall Required Braces or Orthoses: Other Brace Other Brace: L limb guard (wear when mobilizing OOB) Restrictions Weight Bearing Restrictions: Yes LLE Weight Bearing: Non weight bearing    Mobility  Bed Mobility Overal bed mobility: Modified Independent             General bed mobility comments: pt sitting EOB upon arrival of PT, moving himself well at EOB, able to recover from posterior LOB    Transfers Overall transfer level: Needs assistance Equipment used: Rolling walker (2 wheeled) Transfers: Sit to/from Stand Sit to Stand: Min guard         General transfer comment:  pt able to power up from EOB, BSC, and recliner with minG only for safety. intermittent VC for hand positioning, increased activation of glutes. x10 through session  Ambulation/Gait Ambulation/Gait assistance: Min guard Gait Distance (Feet): 15 Feet (+ 10 ft + 15 ft) Assistive device: Rolling walker (2 wheeled) Gait Pattern/deviations: Step-to pattern Gait velocity: decreased Gait velocity interpretation: <1.31 ft/sec, indicative of household ambulator General Gait Details: cues for technique, pt able to manage RW without assist. reports onset of dizziness with last bout of activity, increased to minA at times due to dizziness     Balance Overall balance assessment: Needs assistance Sitting-balance support: Single extremity supported Sitting balance-Leahy Scale: Good Sitting balance - Comments: able to sit and scoot/weight shift without LOB   Standing balance support: Bilateral upper extremity supported Standing balance-Leahy Scale: Poor Standing balance comment: pt reliant on BUE support to maintain standing, attempted single UE support and had minor LOB needing minA                            Cognition Arousal/Alertness: Awake/alert Behavior During Therapy: WFL for tasks assessed/performed Overall Cognitive Status: Within Functional Limits for tasks assessed                                 General Comments: slightly decreased insight, pt initially attempting to complete standing balance with only single UE support, but amenable to cues and recommendations      Exercises  General Comments General comments (skin integrity, edema, etc.): VSS on RA      Pertinent Vitals/Pain Pain Assessment: Faces Faces Pain Scale: Hurts little more Pain Location: left residual limb with mobility tasks, denies pain at rest Pain Descriptors / Indicators: Discomfort;Sore Pain Intervention(s): Limited activity within patient's tolerance;Monitored during  session;Repositioned     PT Goals (current goals can now be found in the care plan section) Acute Rehab PT Goals Patient Stated Goal: to get stronger then go home PT Goal Formulation: With patient Time For Goal Achievement: 12/12/20 Potential to Achieve Goals: Good Progress towards PT goals: Progressing toward goals    Frequency    Min 3X/week      PT Plan Current plan remains appropriate       AM-PAC PT "6 Clicks" Mobility   Outcome Measure  Help needed turning from your back to your side while in a flat bed without using bedrails?: None Help needed moving from lying on your back to sitting on the side of a flat bed without using bedrails?: A Little Help needed moving to and from a bed to a chair (including a wheelchair)?: A Little Help needed standing up from a chair using your arms (e.g., wheelchair or bedside chair)?: A Little Help needed to walk in hospital room?: A Little Help needed climbing 3-5 steps with a railing? : Total 6 Click Score: 17    End of Session Equipment Utilized During Treatment: Gait belt (limb guard) Activity Tolerance: Patient tolerated treatment well Patient left: with call bell/phone within reach;in chair Nurse Communication: Mobility status PT Visit Diagnosis: Difficulty in walking, not elsewhere classified (R26.2);Other abnormalities of gait and mobility (R26.89)     Time: 4081-4481 PT Time Calculation (min) (ACUTE ONLY): 35 min  Charges:  $Gait Training: 8-22 mins $Therapeutic Activity: 8-22 mins                     Karma Ganja, PT, DPT   Acute Rehabilitation Department Pager #: [phone]   Otho Bellows 12/06/2020, 9:20 AM

## 2020-12-06 NOTE — TOC Progression Note (Signed)
Transition of Care [facility]) - Progression Note    Patient Details  Name: [name] MRN: [id number] Date of Birth: 10/11/1960  [facility]) CM/SW Contact  Ralene Bathe, LCSWA Phone Number: 12/06/2020, 5:41 PM  Clinical Narrative:     CSW contacted [facility] and was informed that they are not accepting Medicaid patients at this time.    CSW contacted [facility] and left a message requesting a returned call to ask I they have beds available for medicaid patients.   Expected Discharge Plan: Skilled Nursing Facility Barriers to Discharge: Continued Medical Work up, English as a second language teacher  Expected Discharge Plan and Services Expected Discharge Plan: Skilled Nursing Facility     Post Acute Care Choice: Skilled Nursing Facility Living arrangements for the past 2 months: Apartment Expected Discharge Date: 12/06/20                                     Social Determinants of Health (SDOH) Interventions    Readmission Risk Interventions No flowsheet data found.

## 2020-12-06 NOTE — Progress Notes (Signed)
Occupational Therapy Treatment Patient Details Name: [name] MRN: [id number] DOB: 06-27-1960 Today's Date: 12/06/2020    History of present illness Patient presented to the hospital with a left heel ulcer. 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 PMH: right trans met amputation ("many years ago"), DMII, unspecified visual impariment.   OT comments  Pt progressing well with OT goals this session. Pt tolerated back to back PT then OT sessions with mobility throughout both sessions. Pt focused on Toileting and LB ADL's this session to work towards increasing independence. Pt is at min guard level for all mobility and able to complete pericare and LB bathing at a min guard level once taught compensatory  techniques and single leg balance addressed. Acute OT will continue to follow and assist pt with progressing ADL's and functional mobility.    Follow Up Recommendations  SNF;Supervision/Assistance - 24 hour    Equipment Recommendations  Other (comment) (TBD at next venue)    Recommendations for Other Services      Precautions / Restrictions Precautions Precautions: Fall Required Braces or Orthoses: Other Brace Other Brace: L limb guard (wear when mobilizing OOB) Restrictions Weight Bearing Restrictions: Yes LLE Weight Bearing: Non weight bearing       Mobility Bed Mobility Overal bed mobility: Modified Independent             General bed mobility comments: Pt completed sup to sit with HOB elevated, no assistance needed, safe with all movements in bed.    Transfers Overall transfer level: Needs assistance Equipment used: Rolling walker (2 wheeled) Transfers: Sit to/from Stand Sit to Stand: Min guard         General transfer comment: pt able to power up from EOB, BSC, and recliner with minG only for safety.    Balance Overall balance assessment: Needs assistance Sitting-balance support: Feet supported Sitting balance-Leahy Scale: Good Sitting balance - Comments: able  to sit and scoot/weight shift without LOB   Standing balance support: Bilateral upper extremity supported Standing balance-Leahy Scale: Fair Standing balance comment: Pt reliant on BUE support on RW, however, was able to work on evenly distributing center of mass to safely let go with 1 hand to complete pericare with min guard.                           ADL either performed or assessed with clinical judgement   ADL Overall ADL's : Needs assistance/impaired             Lower Body Bathing: Min guard;Sitting/lateral leans;Sit to/from stand Lower Body Bathing Details (indicate cue type and reason): Pt completed pericare from [facility] sitting and standing with min guard for safety.         Toilet Transfer: Min guard;Cueing for safety;Cueing for Investment banker, corporate Details (indicate cue type and reason): Pt able to take a few hops towards BSC for toileting transfer, overall well controlled movements, min guard for safety.         Functional mobility during ADLs: Min guard;Cueing for safety;Cueing for sequencing;Rolling walker General ADL Comments: Pt educated on LB ADL teechniques he can use for dressing and bathing in sitting, as well as working on single leg balance for standing LB ADL's. Pt motivated to increase independence and work towards improving mobility.     Vision       Perception     Praxis      Cognition Arousal/Alertness: Awake/alert Behavior During Therapy: WFL for tasks assessed/performed Overall  Cognitive Status: Within Functional Limits for tasks assessed                                          Exercises     Shoulder Instructions       General Comments VSS on RA, pt reporting that his BUE are increasingly fatigued with OOB activity, especially his LUE.    Pertinent Vitals/ Pain       Pain Assessment: Faces Faces Pain Scale: Hurts little more Pain Location: left residual limb with mobility tasks, denies pain at  rest Pain Descriptors / Indicators: Discomfort;Sore Pain Intervention(s): Limited activity within patient's tolerance;Monitored during session;Repositioned  Home Living                                          Prior Functioning/Environment              Frequency  Min 2X/week        Progress Toward Goals  OT Goals(current goals can now be found in the care plan section)  Progress towards OT goals: Progressing toward goals  Acute Rehab OT Goals Patient Stated Goal: to get stronger then go home OT Goal Formulation: With patient Time For Goal Achievement: 12/20/20 Potential to Achieve Goals: Good ADL Goals Pt Will Perform Upper Body Bathing: with modified independence;sitting Pt Will Perform Lower Body Bathing: with modified independence;sit to/from stand Pt Will Transfer to Toilet: with modified independence;ambulating;regular height toilet  Plan Discharge plan remains appropriate;Frequency remains appropriate    Co-evaluation                 AM-PAC OT "6 Clicks" Daily Activity     Outcome Measure   Help from another person eating meals?: None Help from another person taking care of personal grooming?: A Little Help from another person toileting, which includes using toliet, bedpan, or urinal?: A Little Help from another person bathing (including washing, rinsing, drying)?: A Little Help from another person to put on and taking off regular upper body clothing?: A Little Help from another person to put on and taking off regular lower body clothing?: A Little 6 Click Score: 19    End of Session Equipment Utilized During Treatment: Gait belt;Rolling walker  OT Visit Diagnosis: Unsteadiness on feet (R26.81);Other abnormalities of gait and mobility (R26.89);Muscle weakness (generalized) (M62.81)   Activity Tolerance Patient tolerated treatment well   Patient Left in bed;with call bell/phone within reach;with bed alarm set   Nurse  Communication Mobility status        Time: 1030-1053 OT Time Calculation (min): 23 min  Charges: OT General Charges $OT Visit: 1 Visit OT Treatments $Self Care/Home Management : 23-37 mins  Eleanore Junio H., OTR/L Acute Rehabilitation  Demya Scruggs Elane Yolanda Bonine 12/06/2020, 2:11 PM

## 2020-12-07 LAB — GLUCOSE, CAPILLARY
Glucose-Capillary: 143 mg/dL — ABNORMAL HIGH (ref 70–99)
Glucose-Capillary: 179 mg/dL — ABNORMAL HIGH (ref 70–99)
Glucose-Capillary: 146 mg/dL — ABNORMAL HIGH (ref 70–99)
Glucose-Capillary: 199 mg/dL — ABNORMAL HIGH (ref 70–99)

## 2020-12-07 LAB — SARS CORONAVIRUS 2 (TAT 6-24 HRS): SARS Coronavirus 2: NEGATIVE

## 2020-12-07 NOTE — TOC Progression Note (Signed)
Transition of Care [facility]) - Progression Note    Patient Details  Name: [name] MRN: [id number] Date of Birth: 1961/03/27  [facility]) CM/SW Contact  Ralene Bathe, LCSWA Phone Number: 12/07/2020, 11:36 AM  Clinical Narrative:     CSW contacted Malena Peer with [facility] to inquire about whether they could accept the patient for SNF.  Malena Peer is reviewing the referral and checking to see if the managed medicaid is in network with the facility.    CSW is awaiting a returned call about whether the facility can accept the patient.    Expected Discharge Plan: Skilled Nursing Facility Barriers to Discharge: Continued Medical Work up, English as a second language teacher  Expected Discharge Plan and Services Expected Discharge Plan: Skilled Nursing Facility     Post Acute Care Choice: Skilled Nursing Facility Living arrangements for the past 2 months: Apartment Expected Discharge Date: 12/06/20                                     Social Determinants of Health (SDOH) Interventions    Readmission Risk Interventions No flowsheet data found.

## 2020-12-08 LAB — GLUCOSE, CAPILLARY
Glucose-Capillary: 149 mg/dL — ABNORMAL HIGH (ref 70–99)
Glucose-Capillary: 182 mg/dL — ABNORMAL HIGH (ref 70–99)
Glucose-Capillary: 170 mg/dL — ABNORMAL HIGH (ref 70–99)
Glucose-Capillary: 159 mg/dL — ABNORMAL HIGH (ref 70–99)

## 2020-12-08 NOTE — Progress Notes (Signed)
Patient ID: [name], male   DOB: 08/18/1960, 60 y.o.   MRN: [id number] Patient is status post left below the knee amputation.  There is no change in the wound VAC drainage currently 100 cc patient has worked with [employer] in the past prescriptions on the chart when patient is ready for discharge to skilled nursing.

## 2020-12-09 LAB — GLUCOSE, CAPILLARY
Glucose-Capillary: 160 mg/dL — ABNORMAL HIGH (ref 70–99)
Glucose-Capillary: 140 mg/dL — ABNORMAL HIGH (ref 70–99)
Glucose-Capillary: 165 mg/dL — ABNORMAL HIGH (ref 70–99)
Glucose-Capillary: 158 mg/dL — ABNORMAL HIGH (ref 70–99)

## 2020-12-09 NOTE — Progress Notes (Signed)
Patient ID: [name], male   DOB: Dec 27, 1960, 60 y.o.   MRN: [id number] Patient is awaiting skilled nursing placement approval.  There is no additional drainage in the wound VAC canister still 100 cc.  The wound VAC has a good suction fit.

## 2020-12-09 NOTE — Plan of Care (Signed)

## 2020-12-10 LAB — GLUCOSE, CAPILLARY
Glucose-Capillary: 132 mg/dL — ABNORMAL HIGH (ref 70–99)
Glucose-Capillary: 167 mg/dL — ABNORMAL HIGH (ref 70–99)
Glucose-Capillary: 183 mg/dL — ABNORMAL HIGH (ref 70–99)
Glucose-Capillary: 161 mg/dL — ABNORMAL HIGH (ref 70–99)

## 2020-12-10 NOTE — Progress Notes (Signed)
Patient is status post below-knee amputation.  He has been awaiting a skilled nursing bed.  Wound VAC canister is still has good suction with 2 green checks.  Vital signs stable alert pleasant no further drainage

## 2020-12-10 NOTE — Progress Notes (Signed)
Physical Therapy Treatment Patient Details Name: [name] MRN: [id number] DOB: 1961/02/19 Today's Date: 12/10/2020    History of Present Illness Patient presented to the hospital with a left heel ulcer. 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 PMH: right trans met amputation ("many years ago"), DMII, unspecified visual impariment.    PT Comments    Pt progressing towards all goals. Discussed extensively how to bath safely and meal prep in home as pt lives alone. Pt with extremely poor single UE support balance and would need to use w/c as primary mode of mobility to get around kitchen. Pt would also need a tub bench for safe showering. Pt remains unsafe to d/c home at this time as pt requiring assist for ADLs and ambulation. Acute PT to cont to follow.    Follow Up Recommendations  SNF;Supervision for mobility/OOB     Equipment Recommendations  Wheelchair (measurements PT);Wheelchair cushion (measurements PT) (tub bench, pt has RW at home)    Recommendations for Other Services Rehab consult     Precautions / Restrictions Precautions Precautions: Fall Required Braces or Orthoses: Other Brace Other Brace: L limb guard (wear when mobilizing OOB) Restrictions Weight Bearing Restrictions: Yes LLE Weight Bearing: Non weight bearing    Mobility  Bed Mobility Overal bed mobility: Modified Independent Bed Mobility: Supine to Sit;Sit to Supine     Supine to sit: Min guard Sit to supine: Min guard   General bed mobility comments: used bed rail, HOB elevated    Transfers Overall transfer level: Needs assistance Equipment used: Rolling walker (2 wheeled) Transfers: Sit to/from Stand Sit to Stand: Min guard         General transfer comment: verbal cues for hand placement, min guard for safety during transition of hands from bed to RW, good power up  Ambulation/Gait Ambulation/Gait assistance: Min guard Gait Distance (Feet): 40 Feet Assistive device: Rolling walker (2  wheeled) Gait Pattern/deviations: Step-to pattern Gait velocity: decreased Gait velocity interpretation: <1.31 ft/sec, indicative of household ambulator General Gait Details: pt with c/o of L shoulder "giving out", pt with good walker management, decreased pace, 3 standing rest breaks   Stairs             Wheelchair Mobility    Modified Rankin (Stroke Patients Only)       Balance Overall balance assessment: Needs assistance Sitting-balance support: Feet supported Sitting balance-Leahy Scale: Good Sitting balance - Comments: able to don R shoe on while EOB without LOB   Standing balance support: Bilateral upper extremity supported Standing balance-Leahy Scale: Fair Standing balance comment: pt with poor single UE support balance                            Cognition Arousal/Alertness: Awake/alert Behavior During Therapy: Flat affect Overall Cognitive Status: Within Functional Limits for tasks assessed                                 General Comments: pt with flat affect but gave good effort t/o session, followed all commands      Exercises Amputee Exercises Quad Sets: AROM;Left;10 reps;Supine Gluteal Sets: AROM;10 reps;Supine Towel Squeeze: AROM;10 reps;Supine Hip Extension: AROM;Left;10 reps;Sidelying Hip ABduction/ADduction: AROM;10 reps;Sidelying;Left Hip Flexion/Marching: AROM;Left;10 reps;Supine Knee Flexion: AROM;Left;10 reps;Supine Knee Extension: AROM;Left;10 reps;Supine Straight Leg Raises: AROM;Left;10 reps;Supine    General Comments General comments (skin integrity, edema, etc.): wound vac still intact  Pertinent Vitals/Pain Pain Assessment: 0-10 Pain Score: 0-No pain    Home Living                      Prior Function            PT Goals (current goals can now be found in the care plan section) Acute Rehab PT Goals PT Goal Formulation: With patient Time For Goal Achievement: 12/12/20 Potential to  Achieve Goals: Good Progress towards PT goals: Progressing toward goals    Frequency    Min 3X/week      PT Plan Current plan remains appropriate    Co-evaluation              AM-PAC PT "6 Clicks" Mobility   Outcome Measure  Help needed turning from your back to your side while in a flat bed without using bedrails?: None Help needed moving from lying on your back to sitting on the side of a flat bed without using bedrails?: A Little Help needed moving to and from a bed to a chair (including a wheelchair)?: A Little Help needed standing up from a chair using your arms (e.g., wheelchair or bedside chair)?: A Little Help needed to walk in hospital room?: A Little Help needed climbing 3-5 steps with a railing? : Total 6 Click Score: 17    End of Session Equipment Utilized During Treatment: Gait belt (limb guard) Activity Tolerance: Patient tolerated treatment well Patient left: with call bell/phone within reach;in chair Nurse Communication: Mobility status PT Visit Diagnosis: Difficulty in walking, not elsewhere classified (R26.2);Other abnormalities of gait and mobility (R26.89)     Time: 1696-7893 PT Time Calculation (min) (ACUTE ONLY): 29 min  Charges:  $Gait Training: 8-22 mins $Therapeutic Exercise: 8-22 mins                     Kittie Plater, PT, DPT Acute Rehabilitation Services Pager #: [phone] Office #: [phone]    Berline Lopes 12/10/2020, 2:20 PM

## 2020-12-10 NOTE — TOC Progression Note (Addendum)
Transition of Care [facility]) - Progression Note    Patient Details  Name: Arliss Frisina MRN: [id number] Date of Birth: Jul 26, 1960  Transition of [facility]) CM/SW Contact  Ralene Bathe, LCSWA Phone Number: 12/10/2020, 10:34 AM  Clinical Narrative:    CSW called Malena Peer with [facility] to follow up on SNF referral for this patient.  There was no answer.  CSW left a message requesting a returned call.    CSW called [facility], [facility], and [facility].  None are able to accept the patient.  CSW contacted Mariella Saa at [facility] to inquire about ability to accept managed medicaid and left a message requesting a returned call.  Placement difficulty due to patient having managed medicaid.     Expected Discharge Plan: Skilled Nursing Facility Barriers to Discharge: Continued Medical Work up, English as a second language teacher  Expected Discharge Plan and Services Expected Discharge Plan: Skilled Nursing Facility     Post Acute Care Choice: Skilled Nursing Facility Living arrangements for the past 2 months: Apartment Expected Discharge Date: 12/06/20                                     Social Determinants of Health (SDOH) Interventions    Readmission Risk Interventions No flowsheet data found.

## 2020-12-10 NOTE — Plan of Care (Signed)
  Problem: Activity: Goal: Risk for activity intolerance will decrease Outcome: Progressing   Problem: Pain Managment: Goal: General experience of comfort will improve Outcome: Progressing   Problem: Safety: Goal: Ability to remain free from injury will improve Outcome: Progressing   

## 2020-12-11 LAB — GLUCOSE, CAPILLARY
Glucose-Capillary: 153 mg/dL — ABNORMAL HIGH (ref 70–99)
Glucose-Capillary: 164 mg/dL — ABNORMAL HIGH (ref 70–99)
Glucose-Capillary: 143 mg/dL — ABNORMAL HIGH (ref 70–99)
Glucose-Capillary: 159 mg/dL — ABNORMAL HIGH (ref 70–99)

## 2020-12-11 NOTE — Progress Notes (Signed)
Physical Therapy Treatment Patient Details Name: [name] MRN: [id number] DOB: 11-Feb-1961 Today's Date: 12/11/2020    History of Present Illness Patient presented to the hospital with a left heel ulcer. 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 PMH: right trans met amputation ("many years ago"), DMII, unspecified visual impariment.    PT Comments    Pt progressing towards all goals. Worked on w/c propulsion,  w/c part management, and squat pvt transfer to/from w/c towards the R. Pt with limited ambulation tolerance due to L shld weakness/fatigue and pain. Pt remains to have poor single limb stance and requires use of bilat UEs for all standing activities. Pt would need a w/c if going home for ADLs in bathroom and kitchen. Acute PT to cont to follow.   Follow Up Recommendations  SNF;Supervision for mobility/OOB     Equipment Recommendations  Wheelchair (measurements PT);Wheelchair cushion (measurements PT) (tub bench, pt has RW at home)    Recommendations for Other Services Rehab consult     Precautions / Restrictions Precautions Precautions: Fall Required Braces or Orthoses: Other Brace Other Brace: L limb guard (wear when mobilizing OOB) Restrictions Weight Bearing Restrictions: Yes LLE Weight Bearing: Non weight bearing    Mobility  Bed Mobility Overal bed mobility: Needs Assistance Bed Mobility: Supine to Sit;Sit to Supine     Supine to sit: Min guard Sit to supine: Min guard   General bed mobility comments: used bed rail, HOB elevated    Transfers Overall transfer level: Needs assistance Equipment used: Rolling walker (2 wheeled) Transfers: Sit to/from Stand Sit to Stand: Min guard        Lateral/Scoot Transfers: Min guard General transfer comment: verbal cues for hand placement, min guard for safety during transition of hands from bed to RW, good power up, worked on lateral scoot/squat pivot transfer  Ambulation/Gait Ambulation/Gait assistance: IT consultant (Feet): 40 Feet (x2) Assistive device: Rolling walker (2 wheeled) Gait Pattern/deviations: Step-to pattern Gait velocity: decreased   General Gait Details: pt with c/o of L shoulder "giving out", pt with good walker management, decreased pace, 3 standing rest breaks   Psychologist, counselling mobility: Yes Wheelchair propulsion: Both upper extremities;Right lower extremity Wheelchair parts: Needs assistance Distance: 50 (x2) Wheelchair Assistance Details (indicate cue type and reason): worked on squat pvt/lateral scoot transfers from bed to w/c. discussed always transferring to his R side, worked on setting up w/c, locking breaks and manageing arm rests, pt continues to require minA for safety and sequencing  Modified Rankin (Stroke Patients Only)       Balance Overall balance assessment: Needs assistance Sitting-balance support: Feet supported Sitting balance-Leahy Scale: Good Sitting balance - Comments: able to don R shoe on while EOB without LOB   Standing balance support: Bilateral upper extremity supported Standing balance-Leahy Scale: Fair Standing balance comment: pt with poor single UE support balance                            Cognition Arousal/Alertness: Awake/alert Behavior During Therapy: Flat affect Overall Cognitive Status: Within Functional Limits for tasks assessed                                 General Comments: pt appears to have difficulty comprehending some higher level skills/function when managing w/c  Exercises Amputee Exercises Quad Sets: AROM;Left;10 reps;Supine Gluteal Sets: AROM;10 reps;Supine Hip ABduction/ADduction: AROM;10 reps;Sidelying;Left Hip Flexion/Marching: AROM;Left;10 reps;Supine Knee Flexion: AROM;Left;10 reps;Supine Straight Leg Raises: AROM;Left;10 reps;Supine    General Comments General comments (skin integrity, edema, etc.): wound  vac still intact      Pertinent Vitals/Pain Pain Assessment: 0-10 Pain Score: 0-No pain (discussed phantom pain and how to manage if patient experiences it)    Home Living                      Prior Function            PT Goals (current goals can now be found in the care plan section) Acute Rehab PT Goals PT Goal Formulation: With patient Time For Goal Achievement: 12/26/20 Potential to Achieve Goals: Good Progress towards PT goals: Progressing toward goals    Frequency    Min 3X/week      PT Plan Current plan remains appropriate    Co-evaluation              AM-PAC PT "6 Clicks" Mobility   Outcome Measure  Help needed turning from your back to your side while in a flat bed without using bedrails?: None Help needed moving from lying on your back to sitting on the side of a flat bed without using bedrails?: A Little Help needed moving to and from a bed to a chair (including a wheelchair)?: A Little Help needed standing up from a chair using your arms (e.g., wheelchair or bedside chair)?: A Little Help needed to walk in hospital room?: A Little Help needed climbing 3-5 steps with a railing? : Total 6 Click Score: 17    End of Session Equipment Utilized During Treatment: Gait belt (limb guard) Activity Tolerance: Patient tolerated treatment well Patient left: with call bell/phone within reach;in chair;with chair alarm set Nurse Communication: Mobility status PT Visit Diagnosis: Difficulty in walking, not elsewhere classified (R26.2);Other abnormalities of gait and mobility (R26.89)     Time: 9753-0051 PT Time Calculation (min) (ACUTE ONLY): 43 min  Charges:  $Gait Training: 8-22 mins $Therapeutic Exercise: 8-22 mins $Therapeutic Activity: 8-22 mins                     Kittie Plater, PT, DPT Acute Rehabilitation Services Pager #: [phone] Office #: [phone]    Berline Lopes 12/11/2020, 1:23 PM

## 2020-12-11 NOTE — Progress Notes (Signed)
Occupational Therapy Treatment Patient Details Name: [name] MRN: [id number] DOB: [birth year] Today's Date: 12/11/2020    History of present illness Patient presented to the hospital with a left heel ulcer. 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 PMH: right trans met amputation ("many years ago"), DMII, unspecified visual impariment.   OT comments  Pt. Was cooperative during session. Pt. Is motivated to work with OT to maximize I and safety with ADLs and mobility. Pt. Ed on dressing techniques to be able to preform LE dressing in bed. Pt. Is not able to bend over to don LE garments in sitting secondary to L LE is in brace.   Follow Up Recommendations  SNF;Supervision/Assistance - 24 hour    Equipment Recommendations  Tub/shower bench (if dc home and not snf)    Recommendations for Other Services      Precautions / Restrictions Precautions Precautions: Fall Required Braces or Orthoses: Other Brace Other Brace: L limb guard (wear when mobilizing OOB) Restrictions Weight Bearing Restrictions: Yes LLE Weight Bearing: Non weight bearing       Mobility Bed Mobility Overal bed mobility: Needs Assistance Bed Mobility: Supine to Sit;Sit to Supine     Supine to sit: Min guard Sit to supine: Min guard   General bed mobility comments: used bed rail, HOB elevated    Transfers Overall transfer level: Needs assistance Equipment used: Rolling walker (2 wheeled) Transfers: Sit to/from Stand Sit to Stand: Min guard        Lateral/Scoot Transfers: Min guard General transfer comment: verbal cues for hand placement, min guard for safety during transition of hands from bed to RW, good power up, worked on lateral scoot/squat pivot transfer    Balance Overall balance assessment: Needs assistance Sitting-balance support: Feet supported Sitting balance-Leahy Scale: Good Sitting balance - Comments: able to don R shoe on while EOB without LOB   Standing balance support: Bilateral upper  extremity supported Standing balance-Leahy Scale: Fair Standing balance comment: pt with poor single UE support balance                           ADL either performed or assessed with clinical judgement   ADL Overall ADL's : Needs assistance/impaired Eating/Feeding: Independent;Sitting   Grooming: Wash/dry hands;Wash/dry face;Set up;Sitting               Lower Body Dressing: Moderate assistance;Sitting/lateral leans;Bed level   Toilet Transfer: Min guard;Cueing for safety;Cueing for sequencing;BSC           Functional mobility during ADLs: Min guard;Cueing for safety;Cueing for sequencing;Rolling walker General ADL Comments: Pt. worked on LE ADLs. Pt. states he has to wear compression sock on R LE. Pt. was not able to don sitting EOB and had pt.get back into bed and bring leg up to don.     Vision       Perception     Praxis      Cognition Arousal/Alertness: Awake/alert Behavior During Therapy: Flat affect Overall Cognitive Status: Within Functional Limits for tasks assessed                                 General Comments: pt appears to have difficulty comprehending some higher level skills/function when managing w/c        Exercises Exercises: Amputee Amputee Exercises Quad Sets: AROM;Left;10 reps;Supine Gluteal Sets: AROM;10 reps;Supine Hip ABduction/ADduction: AROM;10 reps;Sidelying;Left Hip Flexion/Marching: AROM;Left;10 reps;Supine  Knee Flexion: AROM;Left;10 reps;Supine Straight Leg Raises: AROM;Left;10 reps;Supine   Shoulder Instructions       General Comments wound vac still intact    Pertinent Vitals/ Pain       Pain Assessment: 0-10 Pain Score: 0-No pain (discussed phantom pain and how to manage if patient experiences it)  Home Living                                          Prior Functioning/Environment              Frequency  Min 2X/week        Progress Toward Goals  OT  Goals(current goals can now be found in the care plan section)  Progress towards OT goals: Progressing toward goals  Acute Rehab OT Goals Patient Stated Goal: go to rehab. OT Goal Formulation: With patient Time For Goal Achievement: 12/20/20 Potential to Achieve Goals: Good ADL Goals Pt Will Perform Upper Body Bathing: with modified independence;sitting Pt Will Perform Lower Body Bathing: with modified independence;sit to/from stand Pt Will Transfer to Toilet: with modified independence;ambulating;regular height toilet  Plan Discharge plan remains appropriate;Frequency remains appropriate    Co-evaluation                 AM-PAC OT "6 Clicks" Daily Activity     Outcome Measure   Help from another person eating meals?: None Help from another person taking care of personal grooming?: A Little Help from another person toileting, which includes using toliet, bedpan, or urinal?: A Little Help from another person bathing (including washing, rinsing, drying)?: A Little Help from another person to put on and taking off regular upper body clothing?: A Little Help from another person to put on and taking off regular lower body clothing?: A Little 6 Click Score: 19    End of Session    OT Visit Diagnosis: Unsteadiness on feet (R26.81);Other abnormalities of gait and mobility (R26.89);Muscle weakness (generalized) (M62.81)   Activity Tolerance     Patient Left     Nurse Communication          Time: 4471-5806 OT Time Calculation (min): 33 min  Charges: OT General Charges $OT Visit: 1 Visit OT Treatments $Self Care/Home Management : 23-37 mins  Reece Packer OT/L   John Williamsen 12/11/2020, 1:32 PM

## 2020-12-11 NOTE — TOC Progression Note (Signed)
Transition of Care [facility]) - Progression Note    Patient Details  Name: [name] MRN: [id number] Date of Birth: 11-Dec-1960  [facility]) CM/SW Contact  Ralene Bathe, LCSWA Phone Number: 12/11/2020, 5:12 PM  Clinical Narrative:     RE: [name] Date of Birth: December 06, 1960 Date:1961-03-07  Please be advised that the above-named patient will require a short-term nursing home stay - anticipated 30 days or less for rehabilitation and strengthening.  The plan is for return home.    Expected Discharge Plan: Skilled Nursing Facility Barriers to Discharge: Continued Medical Work up, English as a second language teacher  Expected Discharge Plan and Services Expected Discharge Plan: Skilled Nursing Facility     Post Acute Care Choice: Skilled Nursing Facility Living arrangements for the past 2 months: Apartment Expected Discharge Date: 12/06/20                                     Social Determinants of Health (SDOH) Interventions    Readmission Risk Interventions No flowsheet data found.

## 2020-12-11 NOTE — Plan of Care (Signed)
  Problem: Health Behavior/Discharge Planning: Goal: Ability to manage health-related needs will improve Outcome: Progressing   Problem: Activity: Goal: Risk for activity intolerance will decrease Outcome: Progressing   Problem: Elimination: Goal: Will not experience complications related to bowel motility Outcome: Completed/Met

## 2020-12-11 NOTE — TOC Progression Note (Addendum)
Transition of Care [facility]) - Progression Note    Patient Details  Name: [name] MRN: [id number] Date of Birth: 07-Feb-1961  [facility]) CM/SW Contact  Ralene Bathe, LCSWA Phone Number: 12/11/2020, 2:48 PM  Clinical Narrative:    14:47: CSW called [name] with [facility] to inquire about whether they would accept the patient.  There was no answer and the VM has been forwarded to a person named [name].  CSW left a VM requesting a returned call.    14:50-  CSW called [name] with [facility] to inquire about the facilities ability to take managed  medicaid.  There was no answer. CSW then called [name] with [facility].  The facility does take managed medicaid, but does not have any beds available at this time.     14:53-  CSW called [name].  The facility is willing to accept the patient and will begin authorization today.  15:36:  CSW faxed clinicals to [facility] to received a PASRR for the patient.  Patient notified of the information above.  15:44-  CSW called and informed [name] that the patient has had 3 COVID test since admission and the last test was on 6/24.  The facility reported that they would accept this test and patient will not need to be tested again.    16:06-  CSW received a call from [name] with [name] inquiring about therapy notes for insurance authorization.  CSW faxed therapy notes to the facility.      Expected Discharge Plan: Skilled Nursing Facility Barriers to Discharge: Continued Medical Work up, English as a second language teacher  Expected Discharge Plan and Services Expected Discharge Plan: Skilled Nursing Facility     Post Acute Care Choice: Skilled Nursing Facility Living arrangements for the past 2 months: Apartment Expected Discharge Date: 12/06/20                                     Social Determinants of Health (SDOH) Interventions    Readmission Risk Interventions No flowsheet data found.

## 2020-12-11 NOTE — Progress Notes (Addendum)
Patient ID: [name], male   DOB: 03-08-1961, 60 y.o.   MRN: [id number] Is seen in follow-up status post left below-knee amputation.  Patient has no complaints this morning awaiting authorization for skilled nursing placement.  Patient is 11 days postoperative and will discontinue the wound VAC at discharge  Patient will need 30 days or less of rehabilitation at a skilled nursing facility for discharge from the hospital, prior to discharge to home.

## 2020-12-12 LAB — GLUCOSE, CAPILLARY
Glucose-Capillary: 166 mg/dL — ABNORMAL HIGH (ref 70–99)
Glucose-Capillary: 149 mg/dL — ABNORMAL HIGH (ref 70–99)
Glucose-Capillary: 175 mg/dL — ABNORMAL HIGH (ref 70–99)
Glucose-Capillary: 189 mg/dL — ABNORMAL HIGH (ref 70–99)

## 2020-12-12 NOTE — Discharge Summary (Signed)
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 male who was admitted 11/30/2020 with a chief complaint of left ankle abscess, with a final diagnosis of Left Foot Osteomyelitis.  Patient was brought to the operating room on 11/30/2020 and underwent Procedure(s): LEFT BELOW KNEE AMPUTATION.    Patient was given perioperative antibiotics:  Anti-infectives (From admission, onward)    Start     Dose/Rate Route Frequency Ordered Stop   11/30/20 2300  ceFAZolin (ANCEF) IVPB 2g/100 mL premix        2 g 200 mL/hr over 30 Minutes Intravenous Every 8 hours 11/30/20 1748 12/01/20 0804   11/30/20 1345  ceFAZolin (ANCEF) IVPB 2g/100 mL premix        2 g 200 mL/hr over 30 Minutes Intravenous On call to O.R. 11/30/20 1257 11/30/20 1616   11/30/20 1311  ceFAZolin (ANCEF) 2-4 GM/100ML-% IVPB       Note to Pharmacy: [facility]   : cabinet override      11/30/20 1311 11/30/20 1622     .  Patient was given sequential compression devices, early ambulation, and aspirin for DVT prophylaxis.  Recent vital signs: Patient Vitals for the past 24 hrs:  BP Temp Temp src Pulse Resp SpO2  12/11/20 1935 118/60 98.4 F (36.9 C) Oral 89 18 100 %  12/11/20 1123 (!) 103/55 98.2 F (36.8 C) Oral 91 20 100 %  12/11/20 0856 (!) 112/59 97.7 F (36.5 C) Oral 96 16 98 %  .  Recent laboratory studies: No results found.  Discharge Medications:   Allergies as of 12/12/2020       Reactions   No Known Allergies         Medication List     STOP taking these medications    doxycycline 100 MG tablet Commonly known as: VIBRA-TABS       TAKE these medications    Accu-Chek Aviva Plus test strip Generic drug: glucose blood   Accu-Chek Softclix Lancets lancets    ascorbic acid 1000 MG tablet Commonly known as: VITAMIN C Take 1 tablet (1,000 mg total) by mouth daily.   aspirin 81 MG chewable tablet Chew 81 mg by mouth daily.   BD ULTRA-FINE PEN NEEDLES 29G X 12.7MM Misc Generic drug: Insulin Pen Needle   Lantus SoloStar 100 UNIT/ML Solostar Pen Generic drug: insulin glargine Inject 20 Units into the skin at bedtime.   lisinopril 5 MG tablet Commonly known as: ZESTRIL Take 5 mg by mouth daily.   nutrition supplement (JUVEN) Pack Take 1 packet by mouth 2 (two) times daily between meals.   oxyCODONE 5 MG immediate release tablet Commonly known as: Oxy IR/ROXICODONE Take 1-2 tablets (5-10 mg total) by mouth every 4 (four) hours as needed for moderate pain (pain score 4-6).   simvastatin 10 MG tablet Commonly known as: ZOCOR Take 10 mg by mouth at bedtime.   Systane 0.4-0.3 % Gel ophthalmic gel Generic drug: Polyethyl Glycol-Propyl Glycol Place 1 application into both eyes daily as needed (Dry eye).   tamsulosin 0.4 MG Caps capsule Commonly known as: FLOMAX Take 0.4 mg by mouth daily.   Vitamin D (Ergocalciferol) 50 MCG (2000 UT) Caps Take 2,000 Units by mouth daily.   zinc sulfate 220 (50 Zn) MG capsule  Take 1 capsule (220 mg total) by mouth daily.        Diagnostic Studies: MR HEEL LEFT WO CONTRAST  Result Date: 11/28/2020 CLINICAL DATA:  Left heel pain.  Diabetic foot wound. EXAM: MR OF THE LEFT HEEL WITHOUT CONTRAST TECHNIQUE: Multiplanar, multisequence MR imaging of the left heel was performed. No intravenous contrast was administered. COMPARISON:  X-ray 11/01/2020 FINDINGS: Large soft tissue ulceration underlying the plantar aspect of the calcaneal body with ulcer base extending to the cortex. Subtle erosion of the plantar aspect of the calcaneal body with bone marrow edema throughout the anterior and mid calcaneus with patchy low T1 marrow signal changes compatible with acute osteomyelitis (series 7, images 15-20). Mild  marrow edema within the proximal cuboid adjacent to the calcaneocuboid joint with subtle intermediate T1 signal changes concerning for early acute osteomyelitis. The remaining osseous structures are intact without additional site of bony erosion or marrow replacement. No fracture or dislocation. Pes planus alignment with midfoot arthropathy. No joint effusions. Distal Achilles tendinosis with enthesopathic changes and ossification within the distal tendon. Remaining tendinous structures about the ankle remain grossly intact. No tenosynovitis. Fatty atrophy of the visualized musculature. No acute ligamentous abnormality. Prominent circumferential subcutaneous edema. No organized fluid collection. IMPRESSION: 1. Large soft tissue ulceration underlying the plantar aspect of the hindfoot with acute osteomyelitis of the calcaneal body. 2. Findings concerning for early acute osteomyelitis within the adjacent cuboid. 3. Distal Achilles tendinosis. 4. Pes planus alignment with midfoot arthropathy. These results will be called to the ordering clinician or representative by the Radiologist Assistant, and communication documented in the PACS or [employer]. Electronically Signed   By: Duanne Guess D.O.   On: 11/28/2020 08:13    Patient benefited maximally from their hospital stay and there were no complications.     Disposition: Discharge disposition: 01-Home or Self Care      Discharge Instructions     Apply dressing   Complete by: As directed    Cleanse amputation stump daily with antibacterial soap and water.  Do not submerge.  Patient has 2 medicated stump shrinkers.  After cleansing and drying apply a clean stump shrinker directly to the skin do not place any dressing beneath it.  If it drains through stump shrinker apply dressing over top.  May wash soiled some shrinker after removal and dry.  Should alternate between stump shrinker's daily   Call MD / Call 911   Complete by: As directed    If  you experience chest pain or shortness of breath, CALL 911 and be transported to the hospital emergency room.  If you develope a fever above 101 F, pus (white drainage) or increased drainage or redness at the wound, or calf pain, call your surgeon's office.   Call MD / Call 911   Complete by: As directed    If you experience chest pain or shortness of breath, CALL 911 and be transported to the hospital emergency room.  If you develope a fever above 101 F, pus (white drainage) or increased drainage or redness at the wound, or calf pain, call your surgeon's office.   Constipation Prevention   Complete by: As directed    Drink plenty of fluids.  Prune juice may be helpful.  You may use a stool softener, such as Colace (over the counter) 100 mg twice a day.  Use MiraLax (over the counter) for constipation as needed.   Constipation Prevention   Complete by: As directed    Drink  plenty of fluids.  Prune juice may be helpful.  You may use a stool softener, such as Colace (over the counter) 100 mg twice a day.  Use MiraLax (over the counter) for constipation as needed.   Diet - low sodium heart healthy   Complete by: As directed    Diet - low sodium heart healthy   Complete by: As directed    Increase activity slowly as tolerated   Complete by: As directed    Increase activity slowly as tolerated   Complete by: As directed    Negative Pressure Wound Therapy - Incisional   Complete by: As directed    Show patient how to attach vac   Post-operative opioid taper instructions:   Complete by: As directed    POST-OPERATIVE OPIOID TAPER INSTRUCTIONS: It is important to wean off of your opioid medication as soon as possible. If you do not need pain medication after your surgery it is ok to stop day one. Opioids include: Codeine, Hydrocodone(Norco, Vicodin), Oxycodone(Percocet, oxycontin) and hydromorphone amongst others.  Long term and even short term use of opiods can cause: Increased pain  response Dependence Constipation Depression Respiratory depression And more.  Withdrawal symptoms can include Flu like symptoms Nausea, vomiting And more Techniques to manage these symptoms Hydrate well Eat regular healthy meals Stay active Use relaxation techniques(deep breathing, meditating, yoga) Do Not substitute Alcohol to help with tapering If you have been on opioids for less than two weeks and do not have pain than it is ok to stop all together.  Plan to wean off of opioids This plan should start within one week post op of your joint replacement. Maintain the same interval or time between taking each dose and first decrease the dose.  Cut the total daily intake of opioids by one tablet each day Next start to increase the time between doses. The last dose that should be eliminated is the evening dose.      Post-operative opioid taper instructions:   Complete by: As directed    POST-OPERATIVE OPIOID TAPER INSTRUCTIONS: It is important to wean off of your opioid medication as soon as possible. If you do not need pain medication after your surgery it is ok to stop day one. Opioids include: Codeine, Hydrocodone(Norco, Vicodin), Oxycodone(Percocet, oxycontin) and hydromorphone amongst others.  Long term and even short term use of opiods can cause: Increased pain response Dependence Constipation Depression Respiratory depression And more.  Withdrawal symptoms can include Flu like symptoms Nausea, vomiting And more Techniques to manage these symptoms Hydrate well Eat regular healthy meals Stay active Use relaxation techniques(deep breathing, meditating, yoga) Do Not substitute Alcohol to help with tapering If you have been on opioids for less than two weeks and do not have pain than it is ok to stop all together.  Plan to wean off of opioids This plan should start within one week post op of your joint replacement. Maintain the same interval or time between taking  each dose and first decrease the dose.  Cut the total daily intake of opioids by one tablet each day Next start to increase the time between doses. The last dose that should be eliminated is the evening dose.          Contact information for follow-up providers     Adonis HugueninZamora, Erin R, NP Follow up in 1 week(s).   Specialty: Orthopedic Surgery Contact information: [address] [place] [address] [phone]  Contact information for after-discharge care     Destination     [facility] .   Service: Skilled Nursing Contact information: [address] [place] [address] [phone]                      Signed: [place] Charita Lindenberger 12/12/2020, 7:22 AM

## 2020-12-12 NOTE — Progress Notes (Signed)
Bedside shift report complete. Received patient awake,alert/orientedx4 and able to verbalize needs. NAD noted; respirations easy on room air. R toe amputation noted. L BKA noted. Movement/sensation to extremities intact. Whiteboard updated. All safety measures in place and personal belongings within reach.

## 2020-12-12 NOTE — Progress Notes (Signed)
Physical Therapy Treatment Patient Details Name: [name] MRN: [id number] DOB: [birth year] Today's Date: 12/12/2020    History of Present Illness Patient presented to the hospital with a left heel ulcer. 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 PMH: right trans met amputation ("many years ago"), DMII, unspecified visual impariment.    PT Comments    Pt with improved ambulation tolerance however unfortunately sustained a guided slow descent to the ground due to significant LOB when reaching back with R hand for arm rest of chair and missed it, pt twisted to the L and slid down the front of the chair, despite use of gait belt, pt in closed toe supportive shoe on the R and 2nd person holding the chair.Pt unharmed. This situation validated pt unsafe to return home at this time. RN and RN tech lifted pt back up into chair. Pt agreed. Continue to recommend SNF upon d/c. Acute PT to cont to follow.   Follow Up Recommendations  SNF;Supervision for mobility/OOB     Equipment Recommendations  Wheelchair (measurements PT);Wheelchair cushion (measurements PT)    Recommendations for Other Services       Precautions / Restrictions Precautions Precautions: Fall Required Braces or Orthoses: Other Brace Other Brace: L limb guard (wear when mobilizing OOB) Restrictions Weight Bearing Restrictions: Yes LLE Weight Bearing: Non weight bearing    Mobility  Bed Mobility Overal bed mobility: Needs Assistance Bed Mobility: Supine to Sit     Supine to sit: Min guard Sit to supine: Min guard   General bed mobility comments: used bed rail, HOB elevated    Transfers Overall transfer level: Needs assistance Equipment used: Rolling walker (2 wheeled) Transfers: Sit to/from Stand Sit to Stand: Min guard        Lateral/Scoot Transfers: Min guard General transfer comment: verbal cues for hand placement, min guard for safety during transition of hands from bed to RW, good power up, worked on lateral  scoot/squat pivot transfer  Ambulation/Gait Ambulation/Gait assistance: Herbalist (Feet): 60 Feet Assistive device: Rolling walker (2 wheeled) Gait Pattern/deviations: Step-to pattern Gait velocity: decreased Gait velocity interpretation: <1.8 ft/sec, indicate of risk for recurrent falls General Gait Details: 3 standing rest breaks, with onset of fatigue pt with decreased ability to clear R foot, instructed pt to sit before he "tripped" and fell due to not being able to clear his foot. Pt with R shoe on foot. When reaching back to sit pt missed the arm rest and slowly fell to the floor sliding down the front of the chair onto his bottom, his L limb remained unharmed as well as head. Pt with no report of injury   Stairs             Wheelchair Mobility    Modified Rankin (Stroke Patients Only)       Balance Overall balance assessment: Needs assistance Sitting-balance support: Feet supported Sitting balance-Leahy Scale: Good Sitting balance - Comments: able to don R shoe on while EOB without LOB   Standing balance support: Bilateral upper extremity supported Standing balance-Leahy Scale: Fair Standing balance comment: pt with poor single UE support balance                            Cognition Arousal/Alertness: Awake/alert Behavior During Therapy: WFL for tasks assessed/performed Overall Cognitive Status: Within Functional Limits for tasks assessed  Exercises      General Comments General comments (skin integrity, edema, etc.): wound vac no longer in place      Pertinent Vitals/Pain Pain Assessment: 0-10 Pain Score: 0-No pain    Home Living                      Prior Function            PT Goals (current goals can now be found in the care plan section) Progress towards PT goals: Progressing toward goals    Frequency    Min 3X/week      PT Plan Current plan  remains appropriate    Co-evaluation              AM-PAC PT "6 Clicks" Mobility   Outcome Measure  Help needed turning from your back to your side while in a flat bed without using bedrails?: None Help needed moving from lying on your back to sitting on the side of a flat bed without using bedrails?: A Little Help needed moving to and from a bed to a chair (including a wheelchair)?: A Little Help needed standing up from a chair using your arms (e.g., wheelchair or bedside chair)?: A Little Help needed to walk in hospital room?: A Little Help needed climbing 3-5 steps with a railing? : Total 6 Click Score: 17    End of Session Equipment Utilized During Treatment: Gait belt (2nd person for chair follow near end of amb) Activity Tolerance: Patient tolerated treatment well Patient left: in chair;with call bell/phone within reach;with chair alarm set Nurse Communication: Mobility status PT Visit Diagnosis: Difficulty in walking, not elsewhere classified (R26.2);Other abnormalities of gait and mobility (R26.89)     Time: 1834-3735 PT Time Calculation (min) (ACUTE ONLY): 20 min  Charges:  $Gait Training: 8-22 mins                     Kittie Plater, PT, DPT Acute Rehabilitation Services Pager #: [phone] Office #: [phone]    Berline Lopes 12/12/2020, 5:43 PM

## 2020-12-12 NOTE — TOC Progression Note (Addendum)
Transition of Care [facility]) - Progression Note    Patient Details  Name: Olusegun Gerstenberger MRN: [id number] Date of Birth: [birth year]  [facility] CM/SW Contact  Ralene Bathe, LCSWA Phone Number: 12/12/2020, 8:27 AM  Clinical Narrative:     08:27-  CSW faxed scanned 30 day or less rehab note to [facility] in an attempt to receive a PASRR.    09:49-  CSW called [place] to inquire about insurance authorization.  Insurance Berkley Harvey is still pending.    11:46-  PASRR number received.  Coverage is from 6/29/ 2022- 01/11/2021.   [id number]  16:01-  CSW received a call from [place] requesting the patient's PASRR number.  Insurance authorization is still pending.    Expected Discharge Plan: Skilled Nursing Facility Barriers to Discharge: Continued Medical Work up, English as a second language teacher  Expected Discharge Plan and Services Expected Discharge Plan: Skilled Nursing Facility     Post Acute Care Choice: Skilled Nursing Facility Living arrangements for the past 2 months: Apartment Expected Discharge Date: 12/06/20                                     Social Determinants of Health (SDOH) Interventions    Readmission Risk Interventions No flowsheet data found.

## 2020-12-12 NOTE — Progress Notes (Signed)
Patient is 2 weeks status post below-knee amputation.  He is doing well.  Is planning for transfer care skilled nursing today or tomorrow  Vital signs stable and afebrile alert pleasant to exam.  Wound VAC was removed today pictures on chart.  Well apposed wound edges swelling is well controlled.  He has pain medication prescription on the chart for nursing facility.  Will need 1 week follow-up in our office

## 2020-12-13 ENCOUNTER — Telehealth: Payer: Self-pay | Admitting: Orthopedic Surgery

## 2020-12-13 ENCOUNTER — Encounter: Payer: Medicaid Other | Admitting: Orthopedic Surgery

## 2020-12-13 LAB — GLUCOSE, CAPILLARY
Glucose-Capillary: 150 mg/dL — ABNORMAL HIGH (ref 70–99)
Glucose-Capillary: 140 mg/dL — ABNORMAL HIGH (ref 70–99)

## 2020-12-13 NOTE — Progress Notes (Signed)
Physical Therapy Treatment Patient Details Name: [name] MRN: [id number] DOB: 1961/03/21 Today's Date: 12/13/2020    History of Present Illness Patient presented to the hospital with a left heel ulcer. 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 PMH: right trans met amputation ("many years ago"), DMII, unspecified visual impariment.    PT Comments    Pt is progressing well towards goals. He performed side laying HEP prior to ambulating laps in room. Pt increased ambulation distance this session, however still requires seated rest breaks secondary to L shoulder pain. He is expected to d/c to SNF this PM and will benefit from continued skilled PT to maximize functional independence and safety with mobility.     Follow Up Recommendations  SNF;Supervision for mobility/OOB     Equipment Recommendations  Wheelchair (measurements PT);Wheelchair cushion (measurements PT)    Recommendations for Other Services       Precautions / Restrictions Precautions Precautions: Fall Required Braces or Orthoses: Other Brace Other Brace: L limb guard (wear when mobilizing OOB) Restrictions Weight Bearing Restrictions: Yes LLE Weight Bearing: Non weight bearing    Mobility  Bed Mobility Overal bed mobility: Needs Assistance Bed Mobility: Supine to Sit Rolling: Modified independent (Device/Increase time)   Supine to sit: Supervision     General bed mobility comments: used bed rail, HOB elevated    Transfers Overall transfer level: Needs assistance Equipment used: Rolling walker (2 wheeled) Transfers: Sit to/from Stand Sit to Stand: Min guard;Min assist         General transfer comment: min guard to rise from EOB slightly elevated. Min A to rise from room couch due to low surface. Cues for technique and sequencing.  Ambulation/Gait Ambulation/Gait assistance: Min guard Gait Distance (Feet): 80 Feet ([id number]) Assistive device: Rolling walker (2 wheeled) Gait Pattern/deviations:  Step-to pattern (hop to) Gait velocity: decreased   General Gait Details: Pt ambulating laps in room, reporting that his shoulder is doing better than yesterday. 1 seated rest break. Cues for postural control.   Stairs             Wheelchair Mobility    Modified Rankin (Stroke Patients Only)       Balance Overall balance assessment: Needs assistance Sitting-balance support: Feet supported Sitting balance-Leahy Scale: Good Sitting balance - Comments: able to don R shoe on while EOB without LOB   Standing balance support: Bilateral upper extremity supported Standing balance-Leahy Scale: Fair Standing balance comment: pt with poor single UE support balance                            Cognition Arousal/Alertness: Awake/alert Behavior During Therapy: WFL for tasks assessed/performed Overall Cognitive Status: Within Functional Limits for tasks assessed                                        Exercises Amputee Exercises Hip Extension: AROM;Left;10 reps;Sidelying Hip ABduction/ADduction: AROM;10 reps;Sidelying;Left Knee Extension: AROM;Left;5 reps;Supine    General Comments        Pertinent Vitals/Pain Pain Assessment: Faces Faces Pain Scale: Hurts a little bit Pain Location: left residual limb with mobility tasks, denies pain at rest Pain Descriptors / Indicators: Discomfort;Sore Pain Intervention(s): Monitored during session;Limited activity within patient's tolerance;Repositioned    Home Living  Prior Function            PT Goals (current goals can now be found in the care plan section) Acute Rehab PT Goals Patient Stated Goal: go to rehab. PT Goal Formulation: With patient Time For Goal Achievement: 12/26/20 Potential to Achieve Goals: Good Progress towards PT goals: Progressing toward goals    Frequency    Min 3X/week      PT Plan Current plan remains appropriate    Co-evaluation               AM-PAC PT "6 Clicks" Mobility   Outcome Measure  Help needed turning from your back to your side while in a flat bed without using bedrails?: None Help needed moving from lying on your back to sitting on the side of a flat bed without using bedrails?: A Little Help needed moving to and from a bed to a chair (including a wheelchair)?: A Little Help needed standing up from a chair using your arms (e.g., wheelchair or bedside chair)?: A Little Help needed to walk in hospital room?: A Little Help needed climbing 3-5 steps with a railing? : Total 6 Click Score: 17    End of Session Equipment Utilized During Treatment: Gait belt Activity Tolerance: Patient tolerated treatment well Patient left: in chair;with call bell/phone within reach Nurse Communication: Mobility status PT Visit Diagnosis: Difficulty in walking, not elsewhere classified (R26.2);Other abnormalities of gait and mobility (R26.89)     Time: 0802-2336 PT Time Calculation (min) (ACUTE ONLY): 32 min  Charges:  $Gait Training: 8-22 mins $Therapeutic Exercise: 8-22 mins                     Benjiman Core, [place] Pager [phone] Acute Rehab  Allena Katz 12/13/2020, 11:32 AM

## 2020-12-13 NOTE — TOC Progression Note (Addendum)
Transition of Care [facility]) - Progression Note    Patient Details  Name: [name] MRN: [id number] Date of Birth: 1961-04-12  [facility] CM/SW Contact  Ralene Bathe, LCSWA Phone Number: 12/13/2020, 8:39 AM  Clinical Narrative:    08:21-  CSW called [place] to inquire about the status of insurance authorization.  CSW left a message requesting the above information.    09:31-  CSW received a message that the facility has received authorization and the patient can come to the facility when medically ready.  Care Team Notified.  Expected Discharge Plan: Skilled Nursing Facility Barriers to Discharge: Continued Medical Work up, English as a second language teacher  Expected Discharge Plan and Services Expected Discharge Plan: Skilled Nursing Facility     Post Acute Care Choice: Skilled Nursing Facility Living arrangements for the past 2 months: Apartment Expected Discharge Date: 12/06/20                                     Social Determinants of Health (SDOH) Interventions    Readmission Risk Interventions No flowsheet data found.

## 2020-12-13 NOTE — Progress Notes (Signed)
Is 2-week status post below-knee amputation.  Wound VAC was removed yesterday.  He is doing well.  He is wondering if his leg is supposed to look like it does.  He feels he has more prominence of his knee and less of his amputation stump. Examination of his knee he has no pain he has good range of motion is able to fire his quad muscle no effusion no redness no cellulitis  Patient will discharge to nursing facility hopefully today with insurance approval

## 2020-12-13 NOTE — Discharge Summary (Signed)
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 male who was admitted 11/30/2020 with a chief complaint of left foot osteomyelitis, with a final diagnosis of Left Foot Osteomyelitis.  Patient was brought to the operating room on 11/30/2020 and underwent Procedure(s): LEFT BELOW KNEE AMPUTATION.    Patient was given perioperative antibiotics:  Anti-infectives (From admission, onward)    Start     Dose/Rate Route Frequency Ordered Stop   11/30/20 2300  ceFAZolin (ANCEF) IVPB 2g/100 mL premix        2 g 200 mL/hr over 30 Minutes Intravenous Every 8 hours 11/30/20 1748 12/01/20 0804   11/30/20 1345  ceFAZolin (ANCEF) IVPB 2g/100 mL premix        2 g 200 mL/hr over 30 Minutes Intravenous On call to O.R. 11/30/20 1257 11/30/20 1616   11/30/20 1311  ceFAZolin (ANCEF) 2-4 GM/100ML-% IVPB       Note to Pharmacy: [facility]   : cabinet override      11/30/20 1311 11/30/20 1622     .  Patient was given sequential compression devices, early ambulation, and aspirin for DVT prophylaxis.  Recent vital signs: Patient Vitals for the past 24 hrs:  BP Temp Temp src Pulse Resp SpO2  12/13/20 0700 (!) 119/59 98.2 F (36.8 C) Oral 80 18 100 %  12/12/20 2208 (!) 112/59 98.3 F (36.8 C) Oral 79 16 100 %  12/12/20 1103 (!) 126/52 98.4 F (36.9 C) Oral 78 18 100 %  .  Recent laboratory studies: No results found.  Discharge Medications:   Allergies as of 12/13/2020       Reactions   No Known Allergies         Medication List     STOP taking these medications    doxycycline 100 MG tablet Commonly known as: VIBRA-TABS       TAKE these medications    Accu-Chek Aviva Plus test strip Generic drug: glucose blood   Accu-Chek Softclix Lancets  lancets   ascorbic acid 1000 MG tablet Commonly known as: VITAMIN C Take 1 tablet (1,000 mg total) by mouth daily.   aspirin 81 MG chewable tablet Chew 81 mg by mouth daily.   BD ULTRA-FINE PEN NEEDLES 29G X 12.7MM Misc Generic drug: Insulin Pen Needle   Lantus SoloStar 100 UNIT/ML Solostar Pen Generic drug: insulin glargine Inject 20 Units into the skin at bedtime.   lisinopril 5 MG tablet Commonly known as: ZESTRIL Take 5 mg by mouth daily.   nutrition supplement (JUVEN) Pack Take 1 packet by mouth 2 (two) times daily between meals.   oxyCODONE 5 MG immediate release tablet Commonly known as: Oxy IR/ROXICODONE Take 1-2 tablets (5-10 mg total) by mouth every 4 (four) hours as needed for moderate pain (pain score 4-6).   simvastatin 10 MG tablet Commonly known as: ZOCOR Take 10 mg by mouth at bedtime.   Systane 0.4-0.3 % Gel ophthalmic gel Generic drug: Polyethyl Glycol-Propyl Glycol Place 1 application into both eyes daily as needed (Dry eye).   tamsulosin 0.4 MG Caps capsule Commonly known as: FLOMAX Take 0.4 mg by mouth daily.   Vitamin D (Ergocalciferol) 50 MCG (2000 UT) Caps Take 2,000 Units by mouth daily.   zinc sulfate 220 (50 Zn) MG  capsule Take 1 capsule (220 mg total) by mouth daily.        Diagnostic Studies: MR HEEL LEFT WO CONTRAST  Result Date: 11/28/2020 CLINICAL DATA:  Left heel pain.  Diabetic foot wound. EXAM: MR OF THE LEFT HEEL WITHOUT CONTRAST TECHNIQUE: Multiplanar, multisequence MR imaging of the left heel was performed. No intravenous contrast was administered. COMPARISON:  X-ray 11/01/2020 FINDINGS: Large soft tissue ulceration underlying the plantar aspect of the calcaneal body with ulcer base extending to the cortex. Subtle erosion of the plantar aspect of the calcaneal body with bone marrow edema throughout the anterior and mid calcaneus with patchy low T1 marrow signal changes compatible with acute osteomyelitis (series 7, images  15-20). Mild marrow edema within the proximal cuboid adjacent to the calcaneocuboid joint with subtle intermediate T1 signal changes concerning for early acute osteomyelitis. The remaining osseous structures are intact without additional site of bony erosion or marrow replacement. No fracture or dislocation. Pes planus alignment with midfoot arthropathy. No joint effusions. Distal Achilles tendinosis with enthesopathic changes and ossification within the distal tendon. Remaining tendinous structures about the ankle remain grossly intact. No tenosynovitis. Fatty atrophy of the visualized musculature. No acute ligamentous abnormality. Prominent circumferential subcutaneous edema. No organized fluid collection. IMPRESSION: 1. Large soft tissue ulceration underlying the plantar aspect of the hindfoot with acute osteomyelitis of the calcaneal body. 2. Findings concerning for early acute osteomyelitis within the adjacent cuboid. 3. Distal Achilles tendinosis. 4. Pes planus alignment with midfoot arthropathy. These results will be called to the ordering clinician or representative by the Radiologist Assistant, and communication documented in the PACS or [employer]. Electronically Signed   By: Duanne Guess D.O.   On: 11/28/2020 08:13    Patient benefited maximally from their hospital stay and there were no complications.     Disposition: Discharge disposition: 03-Skilled Nursing Facility      Discharge Instructions     Apply dressing   Complete by: As directed    Cleanse amputation stump daily with antibacterial soap and water.  Do not submerge.  Patient has 2 medicated stump shrinkers.  After cleansing and drying apply a clean stump shrinker directly to the skin do not place any dressing beneath it.  If it drains through stump shrinker apply dressing over top.  May wash soiled some shrinker after removal and dry.  Should alternate between stump shrinker's daily   Call MD / Call 911   Complete by:  As directed    If you experience chest pain or shortness of breath, CALL 911 and be transported to the hospital emergency room.  If you develope a fever above 101 F, pus (white drainage) or increased drainage or redness at the wound, or calf pain, call your surgeon's office.   Call MD / Call 911   Complete by: As directed    If you experience chest pain or shortness of breath, CALL 911 and be transported to the hospital emergency room.  If you develope a fever above 101 F, pus (white drainage) or increased drainage or redness at the wound, or calf pain, call your surgeon's office.   Call MD / Call 911   Complete by: As directed    If you experience chest pain or shortness of breath, CALL 911 and be transported to the hospital emergency room.  If you develope a fever above 101 F, pus (white drainage) or increased drainage or redness at the wound, or calf pain, call your surgeon's office.   Constipation  Prevention   Complete by: As directed    Drink plenty of fluids.  Prune juice may be helpful.  You may use a stool softener, such as Colace (over the counter) 100 mg twice a day.  Use MiraLax (over the counter) for constipation as needed.   Constipation Prevention   Complete by: As directed    Drink plenty of fluids.  Prune juice may be helpful.  You may use a stool softener, such as Colace (over the counter) 100 mg twice a day.  Use MiraLax (over the counter) for constipation as needed.   Constipation Prevention   Complete by: As directed    Drink plenty of fluids.  Prune juice may be helpful.  You may use a stool softener, such as Colace (over the counter) 100 mg twice a day.  Use MiraLax (over the counter) for constipation as needed.   Diet - low sodium heart healthy   Complete by: As directed    Diet - low sodium heart healthy   Complete by: As directed    Diet - low sodium heart healthy   Complete by: As directed    Increase activity slowly as tolerated   Complete by: As directed     Increase activity slowly as tolerated   Complete by: As directed    Increase activity slowly as tolerated   Complete by: As directed    Negative Pressure Wound Therapy - Incisional   Complete by: As directed    Show patient how to attach vac   Post-operative opioid taper instructions:   Complete by: As directed    POST-OPERATIVE OPIOID TAPER INSTRUCTIONS: It is important to wean off of your opioid medication as soon as possible. If you do not need pain medication after your surgery it is ok to stop day one. Opioids include: Codeine, Hydrocodone(Norco, Vicodin), Oxycodone(Percocet, oxycontin) and hydromorphone amongst others.  Long term and even short term use of opiods can cause: Increased pain response Dependence Constipation Depression Respiratory depression And more.  Withdrawal symptoms can include Flu like symptoms Nausea, vomiting And more Techniques to manage these symptoms Hydrate well Eat regular healthy meals Stay active Use relaxation techniques(deep breathing, meditating, yoga) Do Not substitute Alcohol to help with tapering If you have been on opioids for less than two weeks and do not have pain than it is ok to stop all together.  Plan to wean off of opioids This plan should start within one week post op of your joint replacement. Maintain the same interval or time between taking each dose and first decrease the dose.  Cut the total daily intake of opioids by one tablet each day Next start to increase the time between doses. The last dose that should be eliminated is the evening dose.      Post-operative opioid taper instructions:   Complete by: As directed    POST-OPERATIVE OPIOID TAPER INSTRUCTIONS: It is important to wean off of your opioid medication as soon as possible. If you do not need pain medication after your surgery it is ok to stop day one. Opioids include: Codeine, Hydrocodone(Norco, Vicodin), Oxycodone(Percocet, oxycontin) and hydromorphone  amongst others.  Long term and even short term use of opiods can cause: Increased pain response Dependence Constipation Depression Respiratory depression And more.  Withdrawal symptoms can include Flu like symptoms Nausea, vomiting And more Techniques to manage these symptoms Hydrate well Eat regular healthy meals Stay active Use relaxation techniques(deep breathing, meditating, yoga) Do Not substitute Alcohol to help with tapering  If you have been on opioids for less than two weeks and do not have pain than it is ok to stop all together.  Plan to wean off of opioids This plan should start within one week post op of your joint replacement. Maintain the same interval or time between taking each dose and first decrease the dose.  Cut the total daily intake of opioids by one tablet each day Next start to increase the time between doses. The last dose that should be eliminated is the evening dose.      Post-operative opioid taper instructions:   Complete by: As directed    POST-OPERATIVE OPIOID TAPER INSTRUCTIONS: It is important to wean off of your opioid medication as soon as possible. If you do not need pain medication after your surgery it is ok to stop day one. Opioids include: Codeine, Hydrocodone(Norco, Vicodin), Oxycodone(Percocet, oxycontin) and hydromorphone amongst others.  Long term and even short term use of opiods can cause: Increased pain response Dependence Constipation Depression Respiratory depression And more.  Withdrawal symptoms can include Flu like symptoms Nausea, vomiting And more Techniques to manage these symptoms Hydrate well Eat regular healthy meals Stay active Use relaxation techniques(deep breathing, meditating, yoga) Do Not substitute Alcohol to help with tapering If you have been on opioids for less than two weeks and do not have pain than it is ok to stop all together.  Plan to wean off of opioids This plan should start within one week  post op of your joint replacement. Maintain the same interval or time between taking each dose and first decrease the dose.  Cut the total daily intake of opioids by one tablet each day Next start to increase the time between doses. The last dose that should be eliminated is the evening dose.          Contact information for follow-up providers     Adonis HugueninZamora, Erin R, NP Follow up in 1 week(s).   Specialty: Orthopedic Surgery Contact information: [address] [place] [address] [phone]              Contact information for after-discharge care     Destination     [facility] .   Service: Skilled Nursing Contact information: [address] [place] [phone]                      Signed: West BaliMary Anne Jacayla Nordell 12/13/2020, 9:59 AM

## 2020-12-13 NOTE — Telephone Encounter (Signed)
PT called and states when he left the hospital to a place [place] [address] and was suppose to get a a compression sock but never did? Wants someone to call him. He doesn't know the name of the place.   CB: [phone]

## 2020-12-13 NOTE — TOC Transition Note (Signed)
Transition of Care [facility]) - CM/SW Discharge Note   Patient Details  Name: [name] MRN: [id number] Date of Birth: 06-Dec-1960  Transition of Care [facility]) CM/SW Contact:  Ralene Bathe, LCSWA Phone Number: 12/13/2020, 10:15 AM   Clinical Narrative:    Patient will DC to: [place] [facility] Anticipated DC date:  12/13/2020 Transport by: Sharin Mons   Per MD patient ready for DC to SNF. RN to call report prior to discharge [phone]. RN, patient, and facility notified of DC. Discharge Summary and FL2 sent to facility. DC packet on chart. Ambulance transport will be requested for patient once RN reports that patient is ready.   CSW will sign off for now as social work intervention is no longer needed. Please consult [place] again if new needs arise.     Final next level of care: Skilled Nursing Facility Barriers to Discharge: Barriers Resolved   Patient Goals and CMS Choice Patient states their goals for this hospitalization and ongoing recovery are:: to get better and get back home CMS Medicare.gov Compare Post Acute Care list provided to:: Patient Choice offered to / list presented to : Patient  Discharge Placement              Patient chooses bed at:  [facility]) Patient to be transferred to facility by: [facility] Name of family member notified: Patient alert and oriented Patient and family notified of of transfer: 12/13/20  Discharge Plan and Services     Post Acute Care Choice: Skilled Nursing Facility                               Social Determinants of Health (SDOH) Interventions     Readmission Risk Interventions No flowsheet data found.

## 2020-12-14 ENCOUNTER — Telehealth: Payer: Self-pay | Admitting: Orthopedic Surgery

## 2020-12-14 NOTE — Telephone Encounter (Signed)
Duplicate message this has been addressed.  

## 2020-12-14 NOTE — Telephone Encounter (Signed)
[phone] [facility] pt is resident at this facility can you please call to make an appt for next week.   I called and sw pt to advise that he had been given two shrinkers at the hospital and he said that currently he only has the one that he is wearing. I advised that I do not have one here in the office for his sister to pick up but I can try and get one in the office for next week. In the meanwhile I advised the pt that we will call facility and make an appt for next week and I hope to have a shrinker in the office by the time of appt.

## 2020-12-14 NOTE — Telephone Encounter (Signed)
Patient called advised he did not get the compression stocking to go over his stump. Patient asked can his sister come by to pick one up for him? The number to contact patient is [phone]

## 2021-01-03 ENCOUNTER — Telehealth: Payer: Self-pay

## 2021-01-03 NOTE — Telephone Encounter (Signed)
I called pt and he states that he was d/c to [facility] and he has been wearing his shrinker daily. He said the nursing staff is not looking at his incision but everything is healing well. He states that he removes the sock at times and I advised he can and ask the nurse to look at it. Pt has appt on Thursday did not want to move it up. Will call with any other questions.

## 2021-01-03 NOTE — Telephone Encounter (Signed)
Pt called and would like to speak with Dr. Lajoyce Corners regarding his upcoming post op appt.

## 2021-01-10 ENCOUNTER — Ambulatory Visit (INDEPENDENT_AMBULATORY_CARE_PROVIDER_SITE_OTHER): Payer: Medicaid Other | Admitting: Orthopedic Surgery

## 2021-01-10 ENCOUNTER — Other Ambulatory Visit: Payer: Self-pay

## 2021-01-10 DIAGNOSIS — Z89512 Acquired absence of left leg below knee: Secondary | ICD-10-CM

## 2021-01-17 ENCOUNTER — Encounter: Payer: Self-pay | Admitting: Orthopedic Surgery

## 2021-01-17 ENCOUNTER — Telehealth: Payer: Self-pay | Admitting: Orthopedic Surgery

## 2021-01-17 NOTE — Telephone Encounter (Signed)
Pt called requesting a call back about his prescription for his compression shrinker. Please call pt at [phone].

## 2021-01-17 NOTE — Progress Notes (Signed)
Office Visit Note   Patient: [name]           Date of Birth: 1961-06-04           MRN: [id number] Visit Date: 01/10/2021              Requested by: Fleet Contras, MD [address] [place],  [place] [address] PCP: Fleet Contras, MD  Chief Complaint  Patient presents with   Left Leg - Follow-up      HPI: Patient is a 60 year old gentleman who is 6 weeks status post left below-knee amputation.  Patient states he has been having increased swelling.  Assessment & Plan: Visit Diagnoses:  1. Hx of BKA, left (HCC)     Plan: Patient will need to get into a smaller stump shrinker I feel patient is safe for discharge to home at this time with home health nursing and therapy.  Patient was provided a prescription for [facility] for a prosthesis and smaller stump shrinker.  Follow-Up Instructions: Return in about 3 weeks (around 01/31/2021).   Ortho Exam  Patient is alert, oriented, no adenopathy, well-dressed, normal affect, normal respiratory effort. Examination the incision has healed nicely there is no redness no cellulitis he does have pitting edema staples are harvested.  Imaging: No results found.    Labs: Lab Results  Component Value Date   HGBA1C 6.4 (H) 11/30/2020   HGBA1C 7.1 (H) 09/24/2016   HGBA1C 6.8 (H) 06/17/2014   ESRSEDRATE 45 (H) 06/17/2014   CRP 1.5 (H) 06/17/2014   REPTSTATUS 06/19/2014 FINAL 06/17/2014   GRAMSTAIN  06/17/2014    RARE WBC PRESENT, PREDOMINANTLY PMN NO SQUAMOUS EPITHELIAL CELLS SEEN NO ORGANISMS SEEN Performed at [facility]    CULT  06/17/2014    MULTIPLE ORGANISMS PRESENT, NONE PREDOMINANT Note: NO STAPHYLOCOCCUS AUREUS ISOLATED NO GROUP A STREP (S.PYOGENES) ISOLATED Performed at [facility]    LABORGA STAPHYLOCOCCUS AUREUS 11/16/2007   LABORGA MORGANELLA MORGANII 11/16/2007     Lab Results  Component Value Date   ALBUMIN 3.4 (L) 07/27/2015   ALBUMIN 3.3 (L) 04/20/2015   ALBUMIN 3.6 06/17/2014     Lab Results  Component Value Date   MG 1.8 11/26/2007   No results found for: VD25OH  No results found for: PREALBUMIN CBC EXTENDED Latest Ref Rng & Units 12/02/2020 12/01/2020 11/30/2020  WBC 4.0 - 10.5 K/uL 9.7 9.6 10.7(H)  RBC 4.22 - 5.81 MIL/uL 3.64(L) 3.64(L) 4.23  HGB 13.0 - 17.0 g/dL 3.7(J) 6.9(C) 11.1(L)  HCT 39.0 - 52.0 % 30.1(L) 29.8(L) 35.7(L)  PLT 150 - 400 K/uL 271 278 286  NEUTROABS 1.7 - 7.7 K/uL - - -  LYMPHSABS 0.7 - 4.0 K/uL - - -     There is no height or weight on file to calculate BMI.  Orders:  No orders of the defined types were placed in this encounter.  No orders of the defined types were placed in this encounter.    Procedures: No procedures performed  Clinical Data: No additional findings.  ROS:  All other systems negative, except as noted in the HPI. Review of Systems  Objective: Vital Signs: There were no vitals taken for this visit.  Specialty Comments:  No specialty comments available.  PMFS History: Patient Active Problem List   Diagnosis Date Noted   Subacute osteomyelitis, left ankle and foot (HCC) 11/30/2020   Abscess of bursa of left ankle 11/30/2020   Acute osteomyelitis of toe, right (HCC)    Diabetic polyneuropathy associated  with type 2 diabetes mellitus (HCC) 08/14/2016   Chronic venous hypertension (idiopathic) with inflammation of bilateral lower extremity 08/14/2016   Ulcer of left heel, limited to breakdown of skin (HCC) 05/15/2016   Amputated toe of right foot (HCC) 07/27/2015   Great toe pain 06/17/2014   Type II diabetes mellitus with peripheral circulatory disorder (HCC) 06/17/2014   Elevated transaminase level 06/17/2014   HTN (hypertension) 06/17/2014   Visual disturbance    Past Medical History:  Diagnosis Date   Blood transfusion 2009   Diabetes mellitus    type 2   Hyperlipemia    Hypertension    denies   Leg swelling    Osteomyelitis (HCC)    right 5th toe   Visual disturbance     Family  History  Problem Relation Age of Onset   Cancer Mother        breast    Past Surgical History:  Procedure Laterality Date   AMPUTATION Left 2012   Left Great Toe   AMPUTATION Right 04/20/2015   Procedure: Right Great Toe Amputation;  Surgeon: [name], MD;  Location: [facility] OR;  Service: Orthopedics;  Laterality: Right;   AMPUTATION Right 07/27/2015   Procedure: Right 2nd Toe Amputation;  Surgeon: [name], MD;  Location: [facility] OR;  Service: Orthopedics;  Laterality: Right;   AMPUTATION Right 09/24/2016   Procedure: Right 5th Toe Amputation;  Surgeon: [name], MD;  Location: [facility] OR;  Service: Orthopedics;  Laterality: Right;   AMPUTATION Right 01/07/2017   Procedure: Right Foot 3rd and 4th Toe Amputation;  Surgeon: [name], MD;  Location: [facility] OR;  Service: Orthopedics;  Laterality: Right;   AMPUTATION Left 11/30/2020   Procedure: LEFT BELOW KNEE AMPUTATION;  Surgeon: [name], MD;  Location: [facility] OR;  Service: Orthopedics;  Laterality: Left;   CHOLECYSTECTOMY  03/2011   EYE SURGERY     lazer   foot surgery  11/2007   small toe on left foot removed.   NASAL SEPTUM SURGERY  1981   Due to broken nose. Surgery was to repair how bone healed.   PARS PLANA VITRECTOMY  11/17/2011   Procedure: PARS PLANA VITRECTOMY WITH 23 GAUGE;  Surgeon: Shade Flood, MD;  Location: [facility] OR;  Service: Ophthalmology;  Laterality: Right;  With Endolaser   TOE AMPUTATION Left    5 th   TONSILLECTOMY     Social History   Occupational History   Not on file  Tobacco Use   Smoking status: Former    Years: 5.00    Types: Cigarettes    Quit date: 03/25/1979    Years since quitting: 41.8   Smokeless tobacco: Never  Vaping Use   Vaping Use: Never used  Substance and Sexual Activity   Alcohol use: Not Currently    Comment: occasional  24 pk of malt liqour a month   Drug use: No   Sexual activity: Not on file

## 2021-01-18 NOTE — Telephone Encounter (Signed)
Pt calling again asking to speak with [name] about the previous message. The best call back number is [phone].

## 2021-01-18 NOTE — Telephone Encounter (Signed)
I called and lm on vm to advise that at his last visit Dr. Lajoyce Corners advised he needed a smaller shrinker and that he could get this a [facility]. I gave the contact information to call and schedule an appt so that he can pick this up and if he lost his rx I can fax this to the office for him. To call back and let me know.

## 2021-01-21 ENCOUNTER — Telehealth: Payer: Self-pay | Admitting: Orthopedic Surgery

## 2021-01-21 NOTE — Telephone Encounter (Signed)
Pt called requesting for [name] F to fax his prescription Dr. Lajoyce Corners gave him to [facility] on [address]. Pt did not say what the script is for. [facility] phone number is [phone]. Pt asked for a call back [phone].

## 2021-01-21 NOTE — Telephone Encounter (Signed)
Order written and faxed to [facility] called pt to advise.

## 2021-01-23 ENCOUNTER — Telehealth: Payer: Self-pay | Admitting: Orthopedic Surgery

## 2021-01-23 NOTE — Telephone Encounter (Signed)
Called pt to advise that we can take out at  next visit.

## 2021-01-23 NOTE — Telephone Encounter (Signed)
Patient called. He says he has 3 staples left in his leg. Should he come in before next week? His call back number is [phone]

## 2021-01-31 ENCOUNTER — Other Ambulatory Visit: Payer: Self-pay

## 2021-01-31 ENCOUNTER — Ambulatory Visit (INDEPENDENT_AMBULATORY_CARE_PROVIDER_SITE_OTHER): Payer: Medicaid Other | Admitting: Orthopedic Surgery

## 2021-01-31 DIAGNOSIS — Z89512 Acquired absence of left leg below knee: Secondary | ICD-10-CM

## 2021-02-13 ENCOUNTER — Encounter: Payer: Self-pay | Admitting: Orthopedic Surgery

## 2021-02-13 NOTE — Progress Notes (Signed)
Office Visit Note   Patient: [name]           Date of Birth: Jan 26, 1961           MRN: [id number] Visit Date: 01/31/2021              Requested by: Fleet Contras, MD [address] [place],  [place] [address] PCP: Fleet Contras, MD  Chief Complaint  Patient presents with   Left Leg - Routine Post Op    11/30/20 left BKA       HPI: Patient is a 60 year old gentleman who presents 2 months status post left below-knee amputation he is currently wearing the stump shrinker ambulates with a walker.  Patient feels like he is ready to be casted for his prosthesis.  Assessment & Plan: Visit Diagnoses:  1. Hx of BKA, left (HCC)     Plan: Patient will follow-up with biotech in 1 week for prosthetic fitting.    Follow-Up Instructions: Return in about 2 months (around 04/02/2021).   Ortho Exam  Patient is alert, oriented, no adenopathy, well-dressed, normal affect, normal respiratory effort. Examination the surgical incision is well-healed recommended scar massage no redness no cellulitis no drainage.  Imaging: No results found. No images are attached to the encounter.  Labs: Lab Results  Component Value Date   HGBA1C 6.4 (H) 11/30/2020   HGBA1C 7.1 (H) 09/24/2016   HGBA1C 6.8 (H) 06/17/2014   ESRSEDRATE 45 (H) 06/17/2014   CRP 1.5 (H) 06/17/2014   REPTSTATUS 06/19/2014 FINAL 06/17/2014   GRAMSTAIN  06/17/2014    RARE WBC PRESENT, PREDOMINANTLY PMN NO SQUAMOUS EPITHELIAL CELLS SEEN NO ORGANISMS SEEN Performed at [facility]    CULT  06/17/2014    MULTIPLE ORGANISMS PRESENT, NONE PREDOMINANT Note: NO STAPHYLOCOCCUS AUREUS ISOLATED NO GROUP A STREP (S.PYOGENES) ISOLATED Performed at [facility]    LABORGA STAPHYLOCOCCUS AUREUS 11/16/2007   LABORGA MORGANELLA MORGANII 11/16/2007     Lab Results  Component Value Date   ALBUMIN 3.4 (L) 07/27/2015   ALBUMIN 3.3 (L) 04/20/2015   ALBUMIN 3.6 06/17/2014    Lab Results  Component Value Date    MG 1.8 11/26/2007   No results found for: VD25OH  No results found for: PREALBUMIN CBC EXTENDED Latest Ref Rng & Units 12/02/2020 12/01/2020 11/30/2020  WBC 4.0 - 10.5 K/uL 9.7 9.6 10.7(H)  RBC 4.22 - 5.81 MIL/uL 3.64(L) 3.64(L) 4.23  HGB 13.0 - 17.0 g/dL 8.5(U) 3.1(S) 11.1(L)  HCT 39.0 - 52.0 % 30.1(L) 29.8(L) 35.7(L)  PLT 150 - 400 K/uL 271 278 286  NEUTROABS 1.7 - 7.7 K/uL - - -  LYMPHSABS 0.7 - 4.0 K/uL - - -     There is no height or weight on file to calculate BMI.  Orders:  No orders of the defined types were placed in this encounter.  No orders of the defined types were placed in this encounter.    Procedures: No procedures performed  Clinical Data: No additional findings.  ROS:  All other systems negative, except as noted in the HPI. Review of Systems  Objective: Vital Signs: There were no vitals taken for this visit.  Specialty Comments:  No specialty comments available.  PMFS History: Patient Active Problem List   Diagnosis Date Noted   Subacute osteomyelitis, left ankle and foot (HCC) 11/30/2020   Abscess of bursa of left ankle 11/30/2020   Acute osteomyelitis of toe, right (HCC)    Diabetic polyneuropathy associated with type 2 diabetes mellitus (HCC)  08/14/2016   Chronic venous hypertension (idiopathic) with inflammation of bilateral lower extremity 08/14/2016   Ulcer of left heel, limited to breakdown of skin (HCC) 05/15/2016   Amputated toe of right foot (HCC) 07/27/2015   Great toe pain 06/17/2014   Type II diabetes mellitus with peripheral circulatory disorder (HCC) 06/17/2014   Elevated transaminase level 06/17/2014   HTN (hypertension) 06/17/2014   Visual disturbance    Past Medical History:  Diagnosis Date   Blood transfusion 2009   Diabetes mellitus    type 2   Hyperlipemia    Hypertension    denies   Leg swelling    Osteomyelitis (HCC)    right 5th toe   Visual disturbance     Family History  Problem Relation Age of Onset    Cancer Mother        breast    Past Surgical History:  Procedure Laterality Date   AMPUTATION Left 2012   Left Great Toe   AMPUTATION Right 04/20/2015   Procedure: Right Great Toe Amputation;  Surgeon: [name], MD;  Location: [facility] OR;  Service: Orthopedics;  Laterality: Right;   AMPUTATION Right 07/27/2015   Procedure: Right 2nd Toe Amputation;  Surgeon: [name], MD;  Location: [facility] OR;  Service: Orthopedics;  Laterality: Right;   AMPUTATION Right 09/24/2016   Procedure: Right 5th Toe Amputation;  Surgeon: [name], MD;  Location: [facility] OR;  Service: Orthopedics;  Laterality: Right;   AMPUTATION Right 01/07/2017   Procedure: Right Foot 3rd and 4th Toe Amputation;  Surgeon: [name], MD;  Location: [facility] OR;  Service: Orthopedics;  Laterality: Right;   AMPUTATION Left 11/30/2020   Procedure: LEFT BELOW KNEE AMPUTATION;  Surgeon: [name], MD;  Location: [facility] OR;  Service: Orthopedics;  Laterality: Left;   CHOLECYSTECTOMY  03/2011   EYE SURGERY     lazer   foot surgery  11/2007   small toe on left foot removed.   NASAL SEPTUM SURGERY  1981   Due to broken nose. Surgery was to repair how bone healed.   PARS PLANA VITRECTOMY  11/17/2011   Procedure: PARS PLANA VITRECTOMY WITH 23 GAUGE;  Surgeon: Shade Flood, MD;  Location: [facility] OR;  Service: Ophthalmology;  Laterality: Right;  With Endolaser   TOE AMPUTATION Left    5 th   TONSILLECTOMY     Social History   Occupational History   Not on file  Tobacco Use   Smoking status: Former    Years: 5.00    Types: Cigarettes    Quit date: 03/25/1979    Years since quitting: 41.9   Smokeless tobacco: Never  Vaping Use   Vaping Use: Never used  Substance and Sexual Activity   Alcohol use: Not Currently    Comment: occasional  24 pk of malt liqour a month   Drug use: No   Sexual activity: Not on file

## 2021-03-12 ENCOUNTER — Encounter: Payer: Self-pay | Admitting: Orthopedic Surgery

## 2021-03-12 ENCOUNTER — Ambulatory Visit (INDEPENDENT_AMBULATORY_CARE_PROVIDER_SITE_OTHER): Payer: Medicaid Other | Admitting: Physician Assistant

## 2021-03-12 DIAGNOSIS — Z89512 Acquired absence of left leg below knee: Secondary | ICD-10-CM

## 2021-03-12 NOTE — Progress Notes (Signed)
Office Visit Note   Patient: [name]           Date of Birth: 12-Mar-1961           MRN: [id number] Visit Date: 03/12/2021              Requested by: Fleet Contras, MD [address] [place],  [place] [address] PCP: Fleet Contras, MD  Chief Complaint  Patient presents with   Left Leg - Follow-up    11/30/20 left BKA       HPI: Patient is a pleasant 60 year old gentleman who is 3 months status post left below-knee amputation.  He is in the process of being molded for a prosthetic.  He is concerned that the "skin is coming off of my leg "he thinks this is secondary to the shrinker.  He is also requesting a prescription for shoes for his feet.  He does have a history of a third fourth and fifth toe amputations on the right.  He also reports a history of recently hitting his limb about 5 days ago he denies much pain  Assessment & Plan: Visit Diagnoses: No diagnosis found.  Plan: I stressed the importance to the shrinker and explained how it works to the patient.  I did tell him that this was how it removes dead skin.  He is adamant that he is going to try just using the shrinker provided to him by [employer].  Patient already has a follow-up scheduled for 4 weeks.  I do not see any sign of acute injury to his stump  Follow-Up Instructions: No follow-ups on file.   Ortho Exam  Patient is alert, oriented, no adenopathy, well-dressed, normal affect, normal respiratory effort. Examination well-healed amputation stump.  He does have some areas of pink blotchy skin.  But this is not any drainage no cellulitis no signs of infection.  The end of the stump is intact without any fluctuance.  He has no pain to palpation.  He has a spot of drainage on the amputation stump with a small eschar.  Nothing could be expressed except some minimal amount of clear serous fluid  Imaging: No results found. No images are attached to the encounter.  Labs: Lab Results  Component Value Date   HGBA1C  6.4 (H) 11/30/2020   HGBA1C 7.1 (H) 09/24/2016   HGBA1C 6.8 (H) 06/17/2014   ESRSEDRATE 45 (H) 06/17/2014   CRP 1.5 (H) 06/17/2014   REPTSTATUS 06/19/2014 FINAL 06/17/2014   GRAMSTAIN  06/17/2014    RARE WBC PRESENT, PREDOMINANTLY PMN NO SQUAMOUS EPITHELIAL CELLS SEEN NO ORGANISMS SEEN Performed at [facility]    CULT  06/17/2014    MULTIPLE ORGANISMS PRESENT, NONE PREDOMINANT Note: NO STAPHYLOCOCCUS AUREUS ISOLATED NO GROUP A STREP (S.PYOGENES) ISOLATED Performed at [facility]    LABORGA STAPHYLOCOCCUS AUREUS 11/16/2007   LABORGA MORGANELLA MORGANII 11/16/2007     Lab Results  Component Value Date   ALBUMIN 3.4 (L) 07/27/2015   ALBUMIN 3.3 (L) 04/20/2015   ALBUMIN 3.6 06/17/2014    Lab Results  Component Value Date   MG 1.8 11/26/2007   No results found for: VD25OH  No results found for: PREALBUMIN CBC EXTENDED Latest Ref Rng & Units 12/02/2020 12/01/2020 11/30/2020  WBC 4.0 - 10.5 K/uL 9.7 9.6 10.7(H)  RBC 4.22 - 5.81 MIL/uL 3.64(L) 3.64(L) 4.23  HGB 13.0 - 17.0 g/dL 0.6(C) 3.7(S) 11.1(L)  HCT 39.0 - 52.0 % 30.1(L) 29.8(L) 35.7(L)  PLT 150 - 400 K/uL 271  278 286  NEUTROABS 1.7 - 7.7 K/uL - - -  LYMPHSABS 0.7 - 4.0 K/uL - - -     There is no height or weight on file to calculate BMI.  Orders:  No orders of the defined types were placed in this encounter.  No orders of the defined types were placed in this encounter.    Procedures: No procedures performed  Clinical Data: No additional findings.  ROS:  All other systems negative, except as noted in the HPI. Review of Systems  Objective: Vital Signs: There were no vitals taken for this visit.  Specialty Comments:  No specialty comments available.  PMFS History: Patient Active Problem List   Diagnosis Date Noted   Subacute osteomyelitis, left ankle and foot (HCC) 11/30/2020   Abscess of bursa of left ankle 11/30/2020   Acute osteomyelitis of toe, right (HCC)    Diabetic  polyneuropathy associated with type 2 diabetes mellitus (HCC) 08/14/2016   Chronic venous hypertension (idiopathic) with inflammation of bilateral lower extremity 08/14/2016   Ulcer of left heel, limited to breakdown of skin (HCC) 05/15/2016   Amputated toe of right foot (HCC) 07/27/2015   Great toe pain 06/17/2014   Type II diabetes mellitus with peripheral circulatory disorder (HCC) 06/17/2014   Elevated transaminase level 06/17/2014   HTN (hypertension) 06/17/2014   Visual disturbance    Past Medical History:  Diagnosis Date   Blood transfusion 2009   Diabetes mellitus    type 2   Hyperlipemia    Hypertension    denies   Leg swelling    Osteomyelitis (HCC)    right 5th toe   Visual disturbance     Family History  Problem Relation Age of Onset   Cancer Mother        breast    Past Surgical History:  Procedure Laterality Date   AMPUTATION Left 2012   Left Great Toe   AMPUTATION Right 04/20/2015   Procedure: Right Great Toe Amputation;  Surgeon: [name], MD;  Location: [facility] OR;  Service: Orthopedics;  Laterality: Right;   AMPUTATION Right 07/27/2015   Procedure: Right 2nd Toe Amputation;  Surgeon: [name], MD;  Location: [facility] OR;  Service: Orthopedics;  Laterality: Right;   AMPUTATION Right 09/24/2016   Procedure: Right 5th Toe Amputation;  Surgeon: [name], MD;  Location: [facility] OR;  Service: Orthopedics;  Laterality: Right;   AMPUTATION Right 01/07/2017   Procedure: Right Foot 3rd and 4th Toe Amputation;  Surgeon: [name], MD;  Location: [facility] OR;  Service: Orthopedics;  Laterality: Right;   AMPUTATION Left 11/30/2020   Procedure: LEFT BELOW KNEE AMPUTATION;  Surgeon: [name], MD;  Location: [facility] OR;  Service: Orthopedics;  Laterality: Left;   CHOLECYSTECTOMY  03/2011   EYE SURGERY     lazer   foot surgery  11/2007   small toe on left foot removed.   NASAL SEPTUM SURGERY  1981   Due to broken nose. Surgery was to repair how bone healed.   PARS PLANA  VITRECTOMY  11/17/2011   Procedure: PARS PLANA VITRECTOMY WITH 23 GAUGE;  Surgeon: Shade Flood, MD;  Location: [facility] OR;  Service: Ophthalmology;  Laterality: Right;  With Endolaser   TOE AMPUTATION Left    5 th   TONSILLECTOMY     Social History   Occupational History   Not on file  Tobacco Use   Smoking status: Former    Years: 5.00    Types: Cigarettes  Quit date: 03/25/1979    Years since quitting: 41.9   Smokeless tobacco: Never  Vaping Use   Vaping Use: Never used  Substance and Sexual Activity   Alcohol use: Not Currently    Comment: occasional  24 pk of malt liqour a month   Drug use: No   Sexual activity: Not on file

## 2021-03-20 ENCOUNTER — Telehealth: Payer: Self-pay | Admitting: Orthopedic Surgery

## 2021-03-20 NOTE — Telephone Encounter (Signed)
Pt called requesting a call back concerning a referral for physical therapy upstairs. Pt stated Dr. Lajoyce Corners stated to send referral for pt. Please call pt at [phone].

## 2021-03-21 ENCOUNTER — Other Ambulatory Visit: Payer: Self-pay

## 2021-03-21 DIAGNOSIS — Z89512 Acquired absence of left leg below knee: Secondary | ICD-10-CM

## 2021-03-21 NOTE — Telephone Encounter (Signed)
I placed order for physical therapy pt is s/p left BKA 11/30/20 can you please call to sch gait training as soon as possible? Thank you!

## 2021-03-28 ENCOUNTER — Ambulatory Visit: Payer: Medicaid Other | Admitting: Rehabilitation

## 2021-04-04 ENCOUNTER — Ambulatory Visit (INDEPENDENT_AMBULATORY_CARE_PROVIDER_SITE_OTHER): Payer: Medicaid Other | Admitting: Orthopedic Surgery

## 2021-04-04 ENCOUNTER — Encounter: Payer: Self-pay | Admitting: Orthopedic Surgery

## 2021-04-04 DIAGNOSIS — I87323 Chronic venous hypertension (idiopathic) with inflammation of bilateral lower extremity: Secondary | ICD-10-CM

## 2021-04-04 DIAGNOSIS — Z89512 Acquired absence of left leg below knee: Secondary | ICD-10-CM

## 2021-04-04 DIAGNOSIS — L97921 Non-pressure chronic ulcer of unspecified part of left lower leg limited to breakdown of skin: Secondary | ICD-10-CM

## 2021-04-04 NOTE — Progress Notes (Signed)
Office Visit Note   Patient: [name]           Date of Birth: Jan 08, 1961           MRN: [id number] Visit Date: 04/04/2021              Requested by: Fleet Contras, MD [address] [place],  [place] [address] PCP: Fleet Contras, MD  Chief Complaint  Patient presents with   Left Leg - Follow-up      HPI: Patient is a 60 year old gentleman who presents for 2 separate issues.  #1 chronic venous insufficiency with increased swelling and ulcers on the right lower extremity.  #2 patient tried wearing his prosthesis and developed an end bearing blister.  Patient is out of his prosthesis at this time.  Patient feels like the blister has gotten worse with wearing his stump shrinker so he has been wearing the shrinker tube against the skin and the stump shrinker on top.  Assessment & Plan: Visit Diagnoses:  1. Hx of BKA, left (HCC)   2. Chronic venous hypertension (idiopathic) with inflammation of bilateral lower extremity   3. Skin ulcer of left lower leg, limited to breakdown of skin (HCC)     Plan: Will wrap the right lower extremity with a 3 layer compression wrap.  Discussed the importance of elevation.  Discussed that without compression he is already developed ulcers of the right leg and we need to control the swelling.  Recommended dry dressing and the compression of the left lower extremity due to the weeping edema from the venous insufficiency.  We will follow closely.  Follow-Up Instructions: Return in about 1 week (around 04/11/2021).   Ortho Exam  Patient is alert, oriented, no adenopathy, well-dressed, normal affect, normal respiratory effort. Examination patient has massive venous stasis swelling of the right lower extremity there is weeping edema of the arm multiple ulcers.  Examination the left leg he has an end bearing blister on the residual limb on the left which is superficial and 2 cm in diameter.  There is healthy granulation tissue at the  base.  Imaging: No results found. No images are attached to the encounter.  Labs: Lab Results  Component Value Date   HGBA1C 6.4 (H) 11/30/2020   HGBA1C 7.1 (H) 09/24/2016   HGBA1C 6.8 (H) 06/17/2014   ESRSEDRATE 45 (H) 06/17/2014   CRP 1.5 (H) 06/17/2014   REPTSTATUS 06/19/2014 FINAL 06/17/2014   GRAMSTAIN  06/17/2014    RARE WBC PRESENT, PREDOMINANTLY PMN NO SQUAMOUS EPITHELIAL CELLS SEEN NO ORGANISMS SEEN Performed at [facility]    CULT  06/17/2014    MULTIPLE ORGANISMS PRESENT, NONE PREDOMINANT Note: NO STAPHYLOCOCCUS AUREUS ISOLATED NO GROUP A STREP (S.PYOGENES) ISOLATED Performed at [facility]    LABORGA STAPHYLOCOCCUS AUREUS 11/16/2007   LABORGA MORGANELLA MORGANII 11/16/2007     Lab Results  Component Value Date   ALBUMIN 3.4 (L) 07/27/2015   ALBUMIN 3.3 (L) 04/20/2015   ALBUMIN 3.6 06/17/2014    Lab Results  Component Value Date   MG 1.8 11/26/2007   No results found for: VD25OH  No results found for: PREALBUMIN CBC EXTENDED Latest Ref Rng & Units 12/02/2020 12/01/2020 11/30/2020  WBC 4.0 - 10.5 K/uL 9.7 9.6 10.7(H)  RBC 4.22 - 5.81 MIL/uL 3.64(L) 3.64(L) 4.23  HGB 13.0 - 17.0 g/dL 8.5(Y) 8.5(O) 11.1(L)  HCT 39.0 - 52.0 % 30.1(L) 29.8(L) 35.7(L)  PLT 150 - 400 K/uL 271 278 286  NEUTROABS 1.7 - 7.7  K/uL - - -  LYMPHSABS 0.7 - 4.0 K/uL - - -     There is no height or weight on file to calculate BMI.  Orders:  No orders of the defined types were placed in this encounter.  No orders of the defined types were placed in this encounter.    Procedures: No procedures performed  Clinical Data: No additional findings.  ROS:  All other systems negative, except as noted in the HPI. Review of Systems  Objective: Vital Signs: There were no vitals taken for this visit.  Specialty Comments:  No specialty comments available.  PMFS History: Patient Active Problem List   Diagnosis Date Noted   Subacute osteomyelitis, left ankle  and foot (HCC) 11/30/2020   Abscess of bursa of left ankle 11/30/2020   Acute osteomyelitis of toe, right (HCC)    Diabetic polyneuropathy associated with type 2 diabetes mellitus (HCC) 08/14/2016   Chronic venous hypertension (idiopathic) with inflammation of bilateral lower extremity 08/14/2016   Ulcer of left heel, limited to breakdown of skin (HCC) 05/15/2016   Amputated toe of right foot (HCC) 07/27/2015   Great toe pain 06/17/2014   Type II diabetes mellitus with peripheral circulatory disorder (HCC) 06/17/2014   Elevated transaminase level 06/17/2014   HTN (hypertension) 06/17/2014   Visual disturbance    Past Medical History:  Diagnosis Date   Blood transfusion 2009   Diabetes mellitus    type 2   Hyperlipemia    Hypertension    denies   Leg swelling    Osteomyelitis (HCC)    right 5th toe   Visual disturbance     Family History  Problem Relation Age of Onset   Cancer Mother        breast    Past Surgical History:  Procedure Laterality Date   AMPUTATION Left 2012   Left Great Toe   AMPUTATION Right 04/20/2015   Procedure: Right Great Toe Amputation;  Surgeon: [name], MD;  Location: [facility] OR;  Service: Orthopedics;  Laterality: Right;   AMPUTATION Right 07/27/2015   Procedure: Right 2nd Toe Amputation;  Surgeon: [name], MD;  Location: [facility] OR;  Service: Orthopedics;  Laterality: Right;   AMPUTATION Right 09/24/2016   Procedure: Right 5th Toe Amputation;  Surgeon: [name], MD;  Location: [facility] OR;  Service: Orthopedics;  Laterality: Right;   AMPUTATION Right 01/07/2017   Procedure: Right Foot 3rd and 4th Toe Amputation;  Surgeon: [name], MD;  Location: [facility] OR;  Service: Orthopedics;  Laterality: Right;   AMPUTATION Left 11/30/2020   Procedure: LEFT BELOW KNEE AMPUTATION;  Surgeon: [name], MD;  Location: [facility] OR;  Service: Orthopedics;  Laterality: Left;   CHOLECYSTECTOMY  03/2011   EYE SURGERY     lazer   foot surgery  11/2007   small toe on  left foot removed.   NASAL SEPTUM SURGERY  1981   Due to broken nose. Surgery was to repair how bone healed.   PARS PLANA VITRECTOMY  11/17/2011   Procedure: PARS PLANA VITRECTOMY WITH 23 GAUGE;  Surgeon: Shade Flood, MD;  Location: [facility] OR;  Service: Ophthalmology;  Laterality: Right;  With Endolaser   TOE AMPUTATION Left    5 th   TONSILLECTOMY     Social History   Occupational History   Not on file  Tobacco Use   Smoking status: Former    Years: 5.00    Types: Cigarettes    Quit date: 03/25/1979  Years since quitting: 42.0   Smokeless tobacco: Never  Vaping Use   Vaping Use: Never used  Substance and Sexual Activity   Alcohol use: Not Currently    Comment: occasional  24 pk of malt liqour a month   Drug use: No   Sexual activity: Not on file

## 2021-04-10 ENCOUNTER — Ambulatory Visit: Payer: Medicaid Other

## 2021-04-11 ENCOUNTER — Other Ambulatory Visit: Payer: Self-pay

## 2021-04-11 ENCOUNTER — Ambulatory Visit (INDEPENDENT_AMBULATORY_CARE_PROVIDER_SITE_OTHER): Payer: Medicaid Other | Admitting: Orthopedic Surgery

## 2021-04-11 DIAGNOSIS — Z89512 Acquired absence of left leg below knee: Secondary | ICD-10-CM | POA: Diagnosis not present

## 2021-04-23 ENCOUNTER — Encounter: Payer: Self-pay | Admitting: Orthopedic Surgery

## 2021-04-23 NOTE — Progress Notes (Signed)
Office Visit Note   Patient: [name]           Date of Birth: 03-02-1961           MRN: [id number] Visit Date: 04/11/2021              Requested by: Fleet Contras, MD [address] [place],  [place] [address] PCP: Fleet Contras, MD  Chief Complaint  Patient presents with   Left Leg - Follow-up    11/30/20 left BKA       HPI: Patient is a 60 year old gentleman who is over 3 months status post left transtibial amputation.  Patient states he is unable to use his prosthesis that it caused the skin to peel off.  Assessment & Plan: Visit Diagnoses:  1. Hx of BKA, left (HCC)     Plan: Recommended using the right prosthetic under liner sock.  Follow-Up Instructions: Return in about 4 weeks (around 05/09/2021).   Ortho Exam  Patient is alert, oriented, no adenopathy, well-dressed, normal affect, normal respiratory effort. Patient has developed some blisters from tape.  These superficial blisters are healing well there is no redness no cellulitis.  Imaging: No results found.      Labs: Lab Results  Component Value Date   HGBA1C 6.4 (H) 11/30/2020   HGBA1C 7.1 (H) 09/24/2016   HGBA1C 6.8 (H) 06/17/2014   ESRSEDRATE 45 (H) 06/17/2014   CRP 1.5 (H) 06/17/2014   REPTSTATUS 06/19/2014 FINAL 06/17/2014   GRAMSTAIN  06/17/2014    RARE WBC PRESENT, PREDOMINANTLY PMN NO SQUAMOUS EPITHELIAL CELLS SEEN NO ORGANISMS SEEN Performed at [facility]    CULT  06/17/2014    MULTIPLE ORGANISMS PRESENT, NONE PREDOMINANT Note: NO STAPHYLOCOCCUS AUREUS ISOLATED NO GROUP A STREP (S.PYOGENES) ISOLATED Performed at [facility]    LABORGA STAPHYLOCOCCUS AUREUS 11/16/2007   LABORGA MORGANELLA MORGANII 11/16/2007     Lab Results  Component Value Date   ALBUMIN 3.4 (L) 07/27/2015   ALBUMIN 3.3 (L) 04/20/2015   ALBUMIN 3.6 06/17/2014    Lab Results  Component Value Date   MG 1.8 11/26/2007   No results found for: VD25OH  No results found for:  PREALBUMIN CBC EXTENDED Latest Ref Rng & Units 12/02/2020 12/01/2020 11/30/2020  WBC 4.0 - 10.5 K/uL 9.7 9.6 10.7(H)  RBC 4.22 - 5.81 MIL/uL 3.64(L) 3.64(L) 4.23  HGB 13.0 - 17.0 g/dL 9.5(J) 8.8(C) 11.1(L)  HCT 39.0 - 52.0 % 30.1(L) 29.8(L) 35.7(L)  PLT 150 - 400 K/uL 271 278 286  NEUTROABS 1.7 - 7.7 K/uL - - -  LYMPHSABS 0.7 - 4.0 K/uL - - -     There is no height or weight on file to calculate BMI.  Orders:  No orders of the defined types were placed in this encounter.  No orders of the defined types were placed in this encounter.    Procedures: No procedures performed  Clinical Data: No additional findings.  ROS:  All other systems negative, except as noted in the HPI. Review of Systems  Objective: Vital Signs: There were no vitals taken for this visit.  Specialty Comments:  No specialty comments available.  PMFS History: Patient Active Problem List   Diagnosis Date Noted   Subacute osteomyelitis, left ankle and foot (HCC) 11/30/2020   Abscess of bursa of left ankle 11/30/2020   Acute osteomyelitis of toe, right (HCC)    Diabetic polyneuropathy associated with type 2 diabetes mellitus (HCC) 08/14/2016   Chronic venous hypertension (idiopathic) with inflammation of  bilateral lower extremity 08/14/2016   Ulcer of left heel, limited to breakdown of skin ([employer]) 05/15/2016   Amputated toe of right foot ([place]) 07/27/2015   Great toe pain 06/17/2014   Type II diabetes mellitus with peripheral circulatory disorder ([place]) 06/17/2014   Elevated transaminase level 06/17/2014   HTN (hypertension) 06/17/2014   Visual disturbance    Past Medical History:  Diagnosis Date   Blood transfusion 2009   Diabetes mellitus    type 2   Hyperlipemia    Hypertension    denies   Leg swelling    Osteomyelitis (HCC)    right 5th toe   Visual disturbance     Family History  Problem Relation Age of Onset   Cancer Mother        breast    Past Surgical History:  Procedure  Laterality Date   AMPUTATION Left 2012   Left Great Toe   AMPUTATION Right 04/20/2015   Procedure: Right Great Toe Amputation;  Surgeon: Newt Minion, MD;  Location: [facility];  Service: Orthopedics;  Laterality: Right;   AMPUTATION Right 07/27/2015   Procedure: Right 2nd Toe Amputation;  Surgeon: Newt Minion, MD;  Location: [facility];  Service: Orthopedics;  Laterality: Right;   AMPUTATION Right 09/24/2016   Procedure: Right 5th Toe Amputation;  Surgeon: Newt Minion, MD;  Location: [facility];  Service: Orthopedics;  Laterality: Right;   AMPUTATION Right 01/07/2017   Procedure: Right Foot 3rd and 4th Toe Amputation;  Surgeon: Newt Minion, MD;  Location: [facility];  Service: Orthopedics;  Laterality: Right;   AMPUTATION Left 11/30/2020   Procedure: LEFT BELOW KNEE AMPUTATION;  Surgeon: Newt Minion, MD;  Location: [facility];  Service: Orthopedics;  Laterality: Left;   CHOLECYSTECTOMY  03/2011   EYE SURGERY     lazer   foot surgery  11/2007   small toe on left foot removed.   NASAL SEPTUM SURGERY  1981   Due to broken nose. Surgery was to repair how bone healed.   PARS PLANA VITRECTOMY  11/17/2011   Procedure: PARS PLANA VITRECTOMY WITH 23 GAUGE;  Surgeon: Adonis Brook, MD;  Location: [facility];  Service: Ophthalmology;  Laterality: Right;  With Endolaser   TOE AMPUTATION Left    5 th   TONSILLECTOMY     Social History   Occupational History   Not on file  Tobacco Use   Smoking status: Former    Years: 5.00    Types: Cigarettes    Quit date: 03/25/1979    Years since quitting: 42.1   Smokeless tobacco: Never  Vaping Use   Vaping Use: Never used  Substance and Sexual Activity   Alcohol use: Not Currently    Comment: occasional  24 pk of malt liqour a month   Drug use: No   Sexual activity: Not on file

## 2021-05-13 ENCOUNTER — Ambulatory Visit (INDEPENDENT_AMBULATORY_CARE_PROVIDER_SITE_OTHER): Payer: Medicaid Other | Admitting: Orthopedic Surgery

## 2021-05-13 ENCOUNTER — Other Ambulatory Visit: Payer: Self-pay

## 2021-05-13 ENCOUNTER — Encounter: Payer: Self-pay | Admitting: Orthopedic Surgery

## 2021-05-13 DIAGNOSIS — Z89512 Acquired absence of left leg below knee: Secondary | ICD-10-CM | POA: Diagnosis not present

## 2021-05-13 DIAGNOSIS — I87323 Chronic venous hypertension (idiopathic) with inflammation of bilateral lower extremity: Secondary | ICD-10-CM | POA: Diagnosis not present

## 2021-05-13 NOTE — Progress Notes (Signed)
Office Visit Note   Patient: [name]           Date of Birth: [birth year]           MRN: [id number] Visit Date: 05/13/2021              Requested by: Fleet Contras, MD [address] [place],  [place] [address] PCP: Fleet Contras, MD  Chief Complaint  Patient presents with   Left Foot - Wound Check    S/p left bka      HPI: Patient is a 60 year old gentleman status post left transtibial amputation approximately 5 months ago.  Patient states he is set up to be fit with a prosthesis with [facility].  Patient states he has had episodes of swelling and clear drainage from the right leg concerning for infection.  Assessment & Plan: Visit Diagnoses:  1. Hx of BKA, left (HCC)   2. Chronic venous hypertension (idiopathic) with inflammation of bilateral lower extremity     Plan: Continue with compression on the right lower extremity no clinical signs of infection.  Follow-up with [facility] for prosthetic fitting on the left.  Follow-Up Instructions: Return in about 2 months (around 07/13/2021).   Ortho Exam  Patient is alert, oriented, no adenopathy, well-dressed, normal affect, normal respiratory effort. Examination patient has a well consolidated left transtibial amputation no ulcers he is got full extension of the knee.  There is no swelling.  Examination the right leg there is good wrinkling of the skin there is brawny skin color changes in the calf but no open ulcers no tenderness to palpation no drainage.  The transmetatarsal amputation of the right also has no ulcers.  Patient is a new left transtibial  amputee.  Patient's current comorbidities are not expected to impact the ability to function with the prescribed prosthesis. Patient verbally communicates a strong desire to use a prosthesis. Patient currently requires mobility aids to ambulate without a prosthesis.  Expects not to use mobility aids with a new prosthesis.  Patient is a K2 level ambulator that will use a  prosthesis to walk around their home and the community over low level environmental barriers.      Imaging: No results found.   Labs: Lab Results  Component Value Date   HGBA1C 6.4 (H) 11/30/2020   HGBA1C 7.1 (H) 09/24/2016   HGBA1C 6.8 (H) 06/17/2014   ESRSEDRATE 45 (H) 06/17/2014   CRP 1.5 (H) 06/17/2014   REPTSTATUS 06/19/2014 FINAL 06/17/2014   GRAMSTAIN  06/17/2014    RARE WBC PRESENT, PREDOMINANTLY PMN NO SQUAMOUS EPITHELIAL CELLS SEEN NO ORGANISMS SEEN Performed at [facility]    CULT  06/17/2014    MULTIPLE ORGANISMS PRESENT, NONE PREDOMINANT Note: NO STAPHYLOCOCCUS AUREUS ISOLATED NO GROUP A STREP (S.PYOGENES) ISOLATED Performed at [facility]    LABORGA STAPHYLOCOCCUS AUREUS 11/16/2007   LABORGA MORGANELLA MORGANII 11/16/2007     Lab Results  Component Value Date   ALBUMIN 3.4 (L) 07/27/2015   ALBUMIN 3.3 (L) 04/20/2015   ALBUMIN 3.6 06/17/2014    Lab Results  Component Value Date   MG 1.8 11/26/2007   No results found for: VD25OH  No results found for: PREALBUMIN CBC EXTENDED Latest Ref Rng & Units 12/02/2020 12/01/2020 11/30/2020  WBC 4.0 - 10.5 K/uL 9.7 9.6 10.7(H)  RBC 4.22 - 5.81 MIL/uL 3.64(L) 3.64(L) 4.23  HGB 13.0 - 17.0 g/dL 8.9(Q) 1.1(H) 11.1(L)  HCT 39.0 - 52.0 % 30.1(L) 29.8(L) 35.7(L)  PLT 150 - 400 K/uL  271 278 286  NEUTROABS 1.7 - 7.7 K/uL - - -  LYMPHSABS 0.7 - 4.0 K/uL - - -     There is no height or weight on file to calculate BMI.  Orders:  No orders of the defined types were placed in this encounter.  No orders of the defined types were placed in this encounter.    Procedures: No procedures performed  Clinical Data: No additional findings.  ROS:  All other systems negative, except as noted in the HPI. Review of Systems  Objective: Vital Signs: There were no vitals taken for this visit.  Specialty Comments:  No specialty comments available.  PMFS History: Patient Active Problem List    Diagnosis Date Noted   Subacute osteomyelitis, left ankle and foot ([place]) 11/30/2020   Abscess of bursa of left ankle 11/30/2020   Acute osteomyelitis of toe, right ([place])    Diabetic polyneuropathy associated with type 2 diabetes mellitus ([place]) 08/14/2016   Chronic venous hypertension (idiopathic) with inflammation of bilateral lower extremity 08/14/2016   Ulcer of left heel, limited to breakdown of skin ([place]) 05/15/2016   Amputated toe of right foot ([place]) 07/27/2015   Great toe pain 06/17/2014   Type II diabetes mellitus with peripheral circulatory disorder ([place]) 06/17/2014   Elevated transaminase level 06/17/2014   HTN (hypertension) 06/17/2014   Visual disturbance    Past Medical History:  Diagnosis Date   Blood transfusion 2009   Diabetes mellitus    type 2   Hyperlipemia    Hypertension    denies   Leg swelling    Osteomyelitis (HCC)    right 5th toe   Visual disturbance     Family History  Problem Relation Age of Onset   Cancer Mother        breast    Past Surgical History:  Procedure Laterality Date   AMPUTATION Left 2012   Left Great Toe   AMPUTATION Right 04/20/2015   Procedure: Right Great Toe Amputation;  Surgeon: Newt Minion, MD;  Location: [facility];  Service: Orthopedics;  Laterality: Right;   AMPUTATION Right 07/27/2015   Procedure: Right 2nd Toe Amputation;  Surgeon: Newt Minion, MD;  Location: [facility];  Service: Orthopedics;  Laterality: Right;   AMPUTATION Right 09/24/2016   Procedure: Right 5th Toe Amputation;  Surgeon: Newt Minion, MD;  Location: [facility];  Service: Orthopedics;  Laterality: Right;   AMPUTATION Right 01/07/2017   Procedure: Right Foot 3rd and 4th Toe Amputation;  Surgeon: Newt Minion, MD;  Location: [facility];  Service: Orthopedics;  Laterality: Right;   AMPUTATION Left 11/30/2020   Procedure: LEFT BELOW KNEE AMPUTATION;  Surgeon: Newt Minion, MD;  Location: [facility];  Service: Orthopedics;  Laterality: Left;   CHOLECYSTECTOMY  03/2011   EYE  SURGERY     lazer   foot surgery  11/2007   small toe on left foot removed.   NASAL SEPTUM SURGERY  1981   Due to broken nose. Surgery was to repair how bone healed.   PARS PLANA VITRECTOMY  11/17/2011   Procedure: PARS PLANA VITRECTOMY WITH 23 GAUGE;  Surgeon: Adonis Brook, MD;  Location: [facility];  Service: Ophthalmology;  Laterality: Right;  With Endolaser   TOE AMPUTATION Left    5 th   TONSILLECTOMY     Social History   Occupational History   Not on file  Tobacco Use   Smoking status: Former    Years: 5.00    Types:  Cigarettes    Quit date: 03/25/1979    Years since quitting: 42.1   Smokeless tobacco: Never  Vaping Use   Vaping Use: Never used  Substance and Sexual Activity   Alcohol use: Not Currently    Comment: occasional  24 pk of malt liqour a month   Drug use: No   Sexual activity: Not on file

## 2021-05-29 ENCOUNTER — Ambulatory Visit: Payer: Medicaid Other | Admitting: Rehabilitation

## 2021-06-24 ENCOUNTER — Ambulatory Visit (INDEPENDENT_AMBULATORY_CARE_PROVIDER_SITE_OTHER): Payer: Medicaid Other | Admitting: Orthopedic Surgery

## 2021-06-24 ENCOUNTER — Encounter: Payer: Self-pay | Admitting: Orthopedic Surgery

## 2021-06-24 VITALS — Ht 69.0 in | Wt 256.0 lb

## 2021-06-24 DIAGNOSIS — L97921 Non-pressure chronic ulcer of unspecified part of left lower leg limited to breakdown of skin: Secondary | ICD-10-CM | POA: Diagnosis not present

## 2021-06-24 DIAGNOSIS — Z89512 Acquired absence of left leg below knee: Secondary | ICD-10-CM

## 2021-06-25 ENCOUNTER — Encounter: Payer: Self-pay | Admitting: Orthopedic Surgery

## 2021-06-25 NOTE — Progress Notes (Signed)
Office Visit Note   Patient: [name]           Date of Birth: [birth year]           MRN: [id number] Visit Date: 06/24/2021              Requested by: Nolene Ebbs, MD [address] [place],  [place] [address] PCP: Nolene Ebbs, MD  Chief Complaint  Patient presents with   Left Leg - Follow-up    11/30/2020 Left BKA      HPI: Patient is a 61 year old gentleman who is about 7 months status post left transtibial amputation.  Patient has had decreased volume of the residual limb and is end bearing in his socket causing blistering over the residual limb.  Assessment & Plan: Visit Diagnoses:  1. Hx of BKA, left ([place])   2. Skin ulcer of left lower leg, limited to breakdown of skin ([place])     Plan: Recommended patient continue to wear the compression sleeves today we will apply a 4 x 4 Ace wrap and then the shrinker on top.  Discussed diet recommendations and stopping his diet sodas.  Follow-Up Instructions: Return in about 2 weeks (around 07/08/2021).   Ortho Exam  Patient is alert, oriented, no adenopathy, well-dressed, normal affect, normal respiratory effort. Examination patient has a new end bearing ulcer over the left transtibial amputation residual limb that is 2 x 3 cm in diameter and 0.1 mm deep there is no cellulitis there is good granulation tissue there is no tunneling.  Patient also has massive venous stasis swelling and lymphedema proximal to the knee.  Patient feels like the black stump shrinker causes ulcers.  Imaging: No results found. No images are attached to the encounter.  Labs: Lab Results  Component Value Date   HGBA1C 6.4 (H) 11/30/2020   HGBA1C 7.1 (H) 09/24/2016   HGBA1C 6.8 (H) 06/17/2014   ESRSEDRATE 45 (H) 06/17/2014   CRP 1.5 (H) 06/17/2014   REPTSTATUS 06/19/2014 FINAL 06/17/2014   GRAMSTAIN  06/17/2014    RARE WBC PRESENT, PREDOMINANTLY PMN NO SQUAMOUS EPITHELIAL CELLS SEEN NO ORGANISMS SEEN Performed at [facility]  06/17/2014    MULTIPLE ORGANISMS PRESENT, NONE PREDOMINANT Note: NO STAPHYLOCOCCUS AUREUS ISOLATED NO GROUP A STREP (S.PYOGENES) ISOLATED Performed at [facility] 11/16/2007   LABORGA MORGANELLA MORGANII 11/16/2007     Lab Results  Component Value Date   ALBUMIN 3.4 (L) 07/27/2015   ALBUMIN 3.3 (L) 04/20/2015   ALBUMIN 3.6 06/17/2014    Lab Results  Component Value Date   MG 1.8 11/26/2007   No results found for: VD25OH  No results found for: PREALBUMIN CBC EXTENDED Latest Ref Rng & Units 12/02/2020 12/01/2020 11/30/2020  WBC 4.0 - 10.5 K/uL 9.7 9.6 10.7(H)  RBC 4.22 - 5.81 MIL/uL 3.64(L) 3.64(L) 4.23  HGB 13.0 - 17.0 g/dL 9.2(L) 9.5(L) 11.1(L)  HCT 39.0 - 52.0 % 30.1(L) 29.8(L) 35.7(L)  PLT 150 - 400 K/uL 271 278 286  NEUTROABS 1.7 - 7.7 K/uL - - -  LYMPHSABS 0.7 - 4.0 K/uL - - -     Body mass index is 37.8 kg/m.  Orders:  No orders of the defined types were placed in this encounter.  No orders of the defined types were placed in this encounter.    Procedures: No procedures performed  Clinical Data: No additional findings.  ROS:  All other systems negative, except as noted in  the HPI. Review of Systems  Objective: Vital Signs: Ht 5\' 9"  (1.753 m)    Wt 256 lb (116.1 kg)    BMI 37.80 kg/m   Specialty Comments:  No specialty comments available.  PMFS History: Patient Active Problem List   Diagnosis Date Noted   Subacute osteomyelitis, left ankle and foot ([place]) 11/30/2020   Abscess of bursa of left ankle 11/30/2020   Acute osteomyelitis of toe, right ([place])    Diabetic polyneuropathy associated with type 2 diabetes mellitus ([place]) 08/14/2016   Chronic venous hypertension (idiopathic) with inflammation of bilateral lower extremity 08/14/2016   Ulcer of left heel, limited to breakdown of skin ([place]) 05/15/2016   Amputated toe of right foot ([place] [facility]) 07/27/2015   Great toe pain 06/17/2014   Type II diabetes  mellitus with peripheral circulatory disorder ([place]) 06/17/2014   Elevated transaminase level 06/17/2014   HTN (hypertension) 06/17/2014   Visual disturbance    Past Medical History:  Diagnosis Date   Blood transfusion 2009   Diabetes mellitus    type 2   Hyperlipemia    Hypertension    denies   Leg swelling    Osteomyelitis (HCC)    right 5th toe   Visual disturbance     Family History  Problem Relation Age of Onset   Cancer Mother        breast    Past Surgical History:  Procedure Laterality Date   AMPUTATION Left 2012   Left Great Toe   AMPUTATION Right 04/20/2015   Procedure: Right Great Toe Amputation;  Surgeon: Newt Minion, MD;  Location: [facility];  Service: Orthopedics;  Laterality: Right;   AMPUTATION Right 07/27/2015   Procedure: Right 2nd Toe Amputation;  Surgeon: Newt Minion, MD;  Location: [facility];  Service: Orthopedics;  Laterality: Right;   AMPUTATION Right 09/24/2016   Procedure: Right 5th Toe Amputation;  Surgeon: Newt Minion, MD;  Location: [facility];  Service: Orthopedics;  Laterality: Right;   AMPUTATION Right 01/07/2017   Procedure: Right Foot 3rd and 4th Toe Amputation;  Surgeon: Newt Minion, MD;  Location: [facility];  Service: Orthopedics;  Laterality: Right;   AMPUTATION Left 11/30/2020   Procedure: LEFT BELOW KNEE AMPUTATION;  Surgeon: Newt Minion, MD;  Location: [facility];  Service: Orthopedics;  Laterality: Left;   CHOLECYSTECTOMY  03/2011   EYE SURGERY     lazer   foot surgery  11/2007   small toe on left foot removed.   NASAL SEPTUM SURGERY  1981   Due to broken nose. Surgery was to repair how bone healed.   PARS PLANA VITRECTOMY  11/17/2011   Procedure: PARS PLANA VITRECTOMY WITH 23 GAUGE;  Surgeon: Adonis Brook, MD;  Location: [facility];  Service: Ophthalmology;  Laterality: Right;  With Endolaser   TOE AMPUTATION Left    5 th   TONSILLECTOMY     Social History   Occupational History   Not on file  Tobacco Use   Smoking status: Former    Years: 5.00     Types: Cigarettes    Quit date: 03/25/1979    Years since quitting: 42.2   Smokeless tobacco: Never  Vaping Use   Vaping Use: Never used  Substance and Sexual Activity   Alcohol use: Not Currently    Comment: occasional  24 pk of malt liqour a month   Drug use: No   Sexual activity: Not on file

## 2021-07-15 ENCOUNTER — Ambulatory Visit: Payer: Medicaid Other | Admitting: Orthopedic Surgery

## 2021-07-18 ENCOUNTER — Ambulatory Visit: Payer: Medicaid Other | Admitting: Orthopedic Surgery

## 2021-07-22 ENCOUNTER — Ambulatory Visit: Payer: Medicaid Other | Admitting: Orthopedic Surgery

## 2021-08-13 ENCOUNTER — Ambulatory Visit (INDEPENDENT_AMBULATORY_CARE_PROVIDER_SITE_OTHER): Payer: Medicaid Other | Admitting: Orthopedic Surgery

## 2021-08-13 ENCOUNTER — Other Ambulatory Visit: Payer: Self-pay

## 2021-08-13 ENCOUNTER — Encounter: Payer: Self-pay | Admitting: Orthopedic Surgery

## 2021-08-13 DIAGNOSIS — Z89512 Acquired absence of left leg below knee: Secondary | ICD-10-CM

## 2021-08-13 DIAGNOSIS — L97921 Non-pressure chronic ulcer of unspecified part of left lower leg limited to breakdown of skin: Secondary | ICD-10-CM

## 2021-08-13 NOTE — Progress Notes (Signed)
Office Visit Note   Patient: [name]           Date of Birth: [birth year]           MRN: [id number] Visit Date: 08/13/2021              Requested by: Nolene Ebbs, MD [address] [place],  [place] [address] PCP: Nolene Ebbs, MD  Chief Complaint  Patient presents with   Left Leg - Wound Check    Left BKA      HPI: Patient is a 61 year old gentleman who is seen in follow-up for an baring ulcer on the left transtibial amputation.  Patient has had difficulty with fluctuation in his fluid retention and this is because difficulty with consistent fitting and has resulted in an baring pressure when he loses volume.  Patient states that he does not lay down much and sits with his legs dependent.  Assessment & Plan: Visit Diagnoses:  1. Hx of BKA, left ([place])   2. Skin ulcer of left lower leg, limited to breakdown of skin (HCC)   Enter extra-large sleeve he should wear this at all times.  Discussed that if he develops further skin breakdown he needs to follow-up.  He needs to follow-up with [facility] for modification of the socket.  Plan: Patient was given a prosthetic under liner  Follow-Up Instructions: Return in about 3 weeks (around 09/03/2021).   Ortho Exam  Patient is alert, oriented, no adenopathy, well-dressed, normal affect, normal respiratory effort. Examination the end bearing ulcer measures 2 x 4 cm which has healthy granulation tissue is flat and has superficial epithelialization around the edges.  Patient has not developed a new blister that has clear drainage over the anterior aspect of the tibia.  There is no cellulitis no signs of infection no tenderness to palpation.  Imaging: No results found.   Labs: Lab Results  Component Value Date   HGBA1C 6.4 (H) 11/30/2020   HGBA1C 7.1 (H) 09/24/2016   HGBA1C 6.8 (H) 06/17/2014   ESRSEDRATE 45 (H) 06/17/2014   CRP 1.5 (H) 06/17/2014   REPTSTATUS 06/19/2014 FINAL 06/17/2014   GRAMSTAIN  06/17/2014    RARE WBC  PRESENT, PREDOMINANTLY PMN NO SQUAMOUS EPITHELIAL CELLS SEEN NO ORGANISMS SEEN Performed at [facility]  06/17/2014    MULTIPLE ORGANISMS PRESENT, NONE PREDOMINANT Note: NO STAPHYLOCOCCUS AUREUS ISOLATED NO GROUP A STREP (S.PYOGENES) ISOLATED Performed at [facility] 11/16/2007   LABORGA MORGANELLA MORGANII 11/16/2007     Lab Results  Component Value Date   ALBUMIN 3.4 (L) 07/27/2015   ALBUMIN 3.3 (L) 04/20/2015   ALBUMIN 3.6 06/17/2014    Lab Results  Component Value Date   MG 1.8 11/26/2007   No results found for: VD25OH  No results found for: PREALBUMIN CBC EXTENDED Latest Ref Rng & Units 12/02/2020 12/01/2020 11/30/2020  WBC 4.0 - 10.5 K/uL 9.7 9.6 10.7(H)  RBC 4.22 - 5.81 MIL/uL 3.64(L) 3.64(L) 4.23  HGB 13.0 - 17.0 g/dL 9.2(L) 9.5(L) 11.1(L)  HCT 39.0 - 52.0 % 30.1(L) 29.8(L) 35.7(L)  PLT 150 - 400 K/uL 271 278 286  NEUTROABS 1.7 - 7.7 K/uL - - -  LYMPHSABS 0.7 - 4.0 K/uL - - -     There is no height or weight on file to calculate BMI.  Orders:  No orders of the defined types were placed in this encounter.  No orders of the defined types were placed in  this encounter.    Procedures: No procedures performed  Clinical Data: No additional findings.  ROS:  All other systems negative, except as noted in the HPI. Review of Systems  Objective: Vital Signs: There were no vitals taken for this visit.  Specialty Comments:  No specialty comments available.  PMFS History: Patient Active Problem List   Diagnosis Date Noted   Subacute osteomyelitis, left ankle and foot (HCC) 11/30/2020   Abscess of bursa of left ankle 11/30/2020   Acute osteomyelitis of toe, right (HCC)    Diabetic polyneuropathy associated with type 2 diabetes mellitus (HCC) 08/14/2016   Chronic venous hypertension (idiopathic) with inflammation of bilateral lower extremity 08/14/2016   Ulcer of left heel, limited to breakdown of  skin (HCC) 05/15/2016   Amputated toe of right foot (HCC) 07/27/2015   Great toe pain 06/17/2014   Type II diabetes mellitus with peripheral circulatory disorder (HCC) 06/17/2014   Elevated transaminase level 06/17/2014   HTN (hypertension) 06/17/2014   Visual disturbance    Past Medical History:  Diagnosis Date   Blood transfusion 2009   Diabetes mellitus    type 2   Hyperlipemia    Hypertension    denies   Leg swelling    Osteomyelitis (HCC)    right 5th toe   Visual disturbance     Family History  Problem Relation Age of Onset   Cancer Mother        breast    Past Surgical History:  Procedure Laterality Date   AMPUTATION Left 2012   Left Great Toe   AMPUTATION Right 04/20/2015   Procedure: Right Great Toe Amputation;  Surgeon: [name], MD;  Location: [facility] OR;  Service: Orthopedics;  Laterality: Right;   AMPUTATION Right 07/27/2015   Procedure: Right 2nd Toe Amputation;  Surgeon: [name], MD;  Location: [facility] OR;  Service: Orthopedics;  Laterality: Right;   AMPUTATION Right 09/24/2016   Procedure: Right 5th Toe Amputation;  Surgeon: [name], MD;  Location: [facility] OR;  Service: Orthopedics;  Laterality: Right;   AMPUTATION Right 01/07/2017   Procedure: Right Foot 3rd and 4th Toe Amputation;  Surgeon: [name], MD;  Location: [facility] OR;  Service: Orthopedics;  Laterality: Right;   AMPUTATION Left 11/30/2020   Procedure: LEFT BELOW KNEE AMPUTATION;  Surgeon: [name], MD;  Location: [facility] OR;  Service: Orthopedics;  Laterality: Left;   CHOLECYSTECTOMY  03/2011   EYE SURGERY     lazer   foot surgery  11/2007   small toe on left foot removed.   NASAL SEPTUM SURGERY  1981   Due to broken nose. Surgery was to repair how bone healed.   PARS PLANA VITRECTOMY  11/17/2011   Procedure: PARS PLANA VITRECTOMY WITH 23 GAUGE;  Surgeon: Shade Flood, MD;  Location: [facility] OR;  Service: Ophthalmology;  Laterality: Right;  With Endolaser   TOE AMPUTATION Left    5 th    TONSILLECTOMY     Social History   Occupational History   Not on file  Tobacco Use   Smoking status: Former    Years: 5.00    Types: Cigarettes    Quit date: 03/25/1979    Years since quitting: 42.4   Smokeless tobacco: Never  Vaping Use   Vaping Use: Never used  Substance and Sexual Activity   Alcohol use: Not Currently    Comment: occasional  24 pk of malt liqour a month   Drug use: No   Sexual  activity: Not on file

## 2021-09-02 ENCOUNTER — Ambulatory Visit (INDEPENDENT_AMBULATORY_CARE_PROVIDER_SITE_OTHER): Payer: Medicaid Other | Admitting: Orthopedic Surgery

## 2021-09-02 DIAGNOSIS — Z89512 Acquired absence of left leg below knee: Secondary | ICD-10-CM

## 2021-09-08 ENCOUNTER — Encounter: Payer: Self-pay | Admitting: Orthopedic Surgery

## 2021-09-08 NOTE — Progress Notes (Signed)
? ?Office Visit Note ?  ?Patient: [name]           ?Date of Birth: 1961-03-31           ?MRN: [id number] ?Visit Date: 09/02/2021 ?             ?Requested by: Fleet Contras, MD ?[address] ?[place],  [place] [address] ?PCP: Fleet Contras, MD ? ?Chief Complaint  ?Patient presents with  ? Left Leg - Wound Check  ?  Hx left BKA  ? ? ? ? ?HPI: ?Patient is a 61 year old gentleman status post left transtibial amputation. ? ?Assessment & Plan: ?Visit Diagnoses:  ?1. Hx of BKA, left (HCC)   ? ? ?Plan: Patient will follow-up with [facility] for prosthetic fitting.  Recommended using a liner liner. ? ?Follow-Up Instructions: Return in about 4 weeks (around 09/30/2021).  ? ?Ortho Exam ? ?Patient is alert, oriented, no adenopathy, well-dressed, normal affect, normal respiratory effort. ?Examination the residual limb is cold to the touch there is no ischemic gangrenous changes all blisters have healed.  There are no open wounds.  The edema in the thigh has improved. ? ?Imaging: ?No results found. ?No images are attached to the encounter. ? ?Labs: ?Lab Results  ?Component Value Date  ? HGBA1C 6.4 (H) 11/30/2020  ? HGBA1C 7.1 (H) 09/24/2016  ? HGBA1C 6.8 (H) 06/17/2014  ? ESRSEDRATE 45 (H) 06/17/2014  ? CRP 1.5 (H) 06/17/2014  ? REPTSTATUS 06/19/2014 FINAL 06/17/2014  ? GRAMSTAIN  06/17/2014  ?  RARE WBC PRESENT, PREDOMINANTLY PMN ?NO SQUAMOUS EPITHELIAL CELLS SEEN ?NO ORGANISMS SEEN ?Performed at [facility] ?  ? CULT  06/17/2014  ?  MULTIPLE ORGANISMS PRESENT, NONE PREDOMINANT ?Note: NO STAPHYLOCOCCUS AUREUS ISOLATED NO GROUP A STREP (S.PYOGENES) ISOLATED ?Performed at [facility] ?  ? LABORGA STAPHYLOCOCCUS AUREUS 11/16/2007  ? LABORGA MORGANELLA MORGANII 11/16/2007  ? ? ? ?Lab Results  ?Component Value Date  ? ALBUMIN 3.4 (L) 07/27/2015  ? ALBUMIN 3.3 (L) 04/20/2015  ? ALBUMIN 3.6 06/17/2014  ? ? ?Lab Results  ?Component Value Date  ? MG 1.8 11/26/2007  ? ?No results found for: VD25OH ? ?No results  found for: PREALBUMIN ? ?  Latest Ref Rng & Units 12/02/2020  ?  1:32 AM 12/01/2020  ?  7:36 AM 11/30/2020  ?  2:00 PM  ?CBC EXTENDED  ?WBC 4.0 - 10.5 K/uL 9.7   9.6   10.7    ?RBC 4.22 - 5.81 MIL/uL 3.64   3.64   4.23    ?Hemoglobin 13.0 - 17.0 g/dL 9.2   9.5   76.5    ?HCT 39.0 - 52.0 % 30.1   29.8   35.7    ?Platelets 150 - 400 K/uL 271   278   286    ? ? ? ?There is no height or weight on file to calculate BMI. ? ?Orders:  ?No orders of the defined types were placed in this encounter. ? ?No orders of the defined types were placed in this encounter. ? ? ? Procedures: ?No procedures performed ? ?Clinical Data: ?No additional findings. ? ?ROS: ? ?All other systems negative, except as noted in the HPI. ?Review of Systems ? ?Objective: ?Vital Signs: There were no vitals taken for this visit. ? ?Specialty Comments:  ?No specialty comments available. ? ?PMFS History: ?Patient Active Problem List  ? Diagnosis Date Noted  ? Subacute osteomyelitis, left ankle and foot (HCC) 11/30/2020  ? Abscess of bursa of left ankle 11/30/2020  ?  Acute osteomyelitis of toe, right (HCC)   ? Diabetic polyneuropathy associated with type 2 diabetes mellitus (HCC) 08/14/2016  ? Chronic venous hypertension (idiopathic) with inflammation of bilateral lower extremity 08/14/2016  ? Ulcer of left heel, limited to breakdown of skin (HCC) 05/15/2016  ? Amputated toe of right foot (HCC) 07/27/2015  ? Great toe pain 06/17/2014  ? Type II diabetes mellitus with peripheral circulatory disorder (HCC) 06/17/2014  ? Elevated transaminase level 06/17/2014  ? HTN (hypertension) 06/17/2014  ? Visual disturbance   ? ?Past Medical History:  ?Diagnosis Date  ? Blood transfusion 2009  ? Diabetes mellitus   ? type 2  ? Hyperlipemia   ? Hypertension   ? denies  ? Leg swelling   ? Osteomyelitis (HCC)   ? right 5th toe  ? Visual disturbance   ?  ?Family History  ?Problem Relation Age of Onset  ? Cancer Mother   ?     breast  ?  ?Past Surgical History:  ?Procedure  Laterality Date  ? AMPUTATION Left 2012  ? Left Great Toe  ? AMPUTATION Right 04/20/2015  ? Procedure: Right Great Toe Amputation;  Surgeon: [name], MD;  Location: [facility] OR;  Service: Orthopedics;  Laterality: Right;  ? AMPUTATION Right 07/27/2015  ? Procedure: Right 2nd Toe Amputation;  Surgeon: [name], MD;  Location: [facility] OR;  Service: Orthopedics;  Laterality: Right;  ? AMPUTATION Right 09/24/2016  ? Procedure: Right 5th Toe Amputation;  Surgeon: [name], MD;  Location: [facility] OR;  Service: Orthopedics;  Laterality: Right;  ? AMPUTATION Right 01/07/2017  ? Procedure: Right Foot 3rd and 4th Toe Amputation;  Surgeon: [name], MD;  Location: [facility] OR;  Service: Orthopedics;  Laterality: Right;  ? AMPUTATION Left 11/30/2020  ? Procedure: LEFT BELOW KNEE AMPUTATION;  Surgeon: [name], MD;  Location: [facility] OR;  Service: Orthopedics;  Laterality: Left;  ? CHOLECYSTECTOMY  03/2011  ? EYE SURGERY    ? lazer  ? foot surgery  11/2007  ? small toe on left foot removed.  ? NASAL SEPTUM SURGERY  1981  ? Due to broken nose. Surgery was to repair how bone healed.  ? PARS PLANA VITRECTOMY  11/17/2011  ? Procedure: PARS PLANA VITRECTOMY WITH 23 GAUGE;  Surgeon: Shade Flood, MD;  Location: [facility] OR;  Service: Ophthalmology;  Laterality: Right;  With Endolaser  ? TOE AMPUTATION Left   ? 5 th  ? TONSILLECTOMY    ? ?Social History  ? ?Occupational History  ? Not on file  ?Tobacco Use  ? Smoking status: Former  ?  Years: 5.00  ?  Types: Cigarettes  ?  Quit date: 03/25/1979  ?  Years since quitting: 42.4  ? Smokeless tobacco: Never  ?Vaping Use  ? Vaping Use: Never used  ?Substance and Sexual Activity  ? Alcohol use: Not Currently  ?  Comment: occasional  24 pk of malt liqour a month  ? Drug use: No  ? Sexual activity: Not on file  ? ? ? ? ? ?

## 2021-10-02 ENCOUNTER — Other Ambulatory Visit: Payer: Self-pay

## 2021-10-02 ENCOUNTER — Telehealth: Payer: Self-pay | Admitting: Orthopedic Surgery

## 2021-10-02 DIAGNOSIS — Z89512 Acquired absence of left leg below knee: Secondary | ICD-10-CM

## 2021-10-02 NOTE — Telephone Encounter (Signed)
Patient called. He would like a referral for PT sent to [facility]. His call back number is [phone] ?

## 2021-10-02 NOTE — Telephone Encounter (Signed)
I called and sw pt advised order in for [facility] and he can call and make appt.  ?

## 2021-10-03 ENCOUNTER — Ambulatory Visit: Payer: Medicaid Other | Admitting: Orthopedic Surgery

## 2021-10-11 ENCOUNTER — Ambulatory Visit: Payer: Medicaid Other

## 2021-10-24 ENCOUNTER — Ambulatory Visit: Payer: Medicaid Other | Admitting: Orthopedic Surgery

## 2021-11-06 ENCOUNTER — Other Ambulatory Visit: Payer: Self-pay | Admitting: Internal Medicine

## 2021-11-07 LAB — URINE CULTURE
MICRO NUMBER:: 13439934
Result:: NO GROWTH
SPECIMEN QUALITY:: ADEQUATE

## 2021-11-08 ENCOUNTER — Ambulatory Visit: Payer: Medicaid Other

## 2021-11-28 ENCOUNTER — Ambulatory Visit (INDEPENDENT_AMBULATORY_CARE_PROVIDER_SITE_OTHER): Payer: Medicaid Other | Admitting: Orthopedic Surgery

## 2021-11-28 DIAGNOSIS — I890 Lymphedema, not elsewhere classified: Secondary | ICD-10-CM

## 2021-11-28 DIAGNOSIS — Z89512 Acquired absence of left leg below knee: Secondary | ICD-10-CM | POA: Diagnosis not present

## 2021-12-01 ENCOUNTER — Encounter: Payer: Self-pay | Admitting: Orthopedic Surgery

## 2021-12-01 NOTE — Progress Notes (Signed)
Office Visit Note   Patient: [name]           Date of Birth: [birth year]           MRN: [id number] Visit Date: 11/28/2021              Requested by: Fleet Contras, MD [address] [place],  [place] [address] PCP: Fleet Contras, MD  Chief Complaint  Patient presents with   Left Leg - Follow-up    HX BKA  11/30/2020      HPI: Patient is a 61 year old gentleman who is 1 year status post left transtibial amputation.  Patient has been having persistent swelling in the left thigh with persistent lymphedema changes.  He is wearing a shrinker.  Assessment & Plan: Visit Diagnoses:  1. Hx of BKA, left (HCC)   2. Lymphedema     Plan: Patient will benefit from a lymphedema pump we will start the application process.  Follow-Up Instructions: Return in about 4 weeks (around 12/26/2021).   Ortho Exam  Patient is alert, oriented, no adenopathy, well-dressed, normal affect, normal respiratory effort. Examination patient has significant lymphedema swelling in the left thigh left leg.  The left transtibial amputation incision is stable no cellulitis no drainage.  Imaging: No results found. No images are attached to the encounter.  Labs: Lab Results  Component Value Date   HGBA1C 6.4 (H) 11/30/2020   HGBA1C 7.1 (H) 09/24/2016   HGBA1C 6.8 (H) 06/17/2014   ESRSEDRATE 45 (H) 06/17/2014   CRP 1.5 (H) 06/17/2014   REPTSTATUS 06/19/2014 FINAL 06/17/2014   GRAMSTAIN  06/17/2014    RARE WBC PRESENT, PREDOMINANTLY PMN NO SQUAMOUS EPITHELIAL CELLS SEEN NO ORGANISMS SEEN Performed at [facility]    CULT  06/17/2014    MULTIPLE ORGANISMS PRESENT, NONE PREDOMINANT Note: NO STAPHYLOCOCCUS AUREUS ISOLATED NO GROUP A STREP (S.PYOGENES) ISOLATED Performed at [facility]    LABORGA STAPHYLOCOCCUS AUREUS 11/16/2007   LABORGA MORGANELLA MORGANII 11/16/2007     Lab Results  Component Value Date   ALBUMIN 3.4 (L) 07/27/2015   ALBUMIN 3.3 (L) 04/20/2015    ALBUMIN 3.6 06/17/2014    Lab Results  Component Value Date   MG 1.8 11/26/2007   No results found for: "VD25OH"  No results found for: "PREALBUMIN"    Latest Ref Rng & Units 12/02/2020    1:32 AM 12/01/2020    7:36 AM 11/30/2020    2:00 PM  CBC EXTENDED  WBC 4.0 - 10.5 K/uL 9.7  9.6  10.7   RBC 4.22 - 5.81 MIL/uL 3.64  3.64  4.23   Hemoglobin 13.0 - 17.0 g/dL 9.2  9.5  99.3   HCT 71.6 - 52.0 % 30.1  29.8  35.7   Platelets 150 - 400 K/uL 271  278  286      There is no height or weight on file to calculate BMI.  Orders:  No orders of the defined types were placed in this encounter.  No orders of the defined types were placed in this encounter.    Procedures: No procedures performed  Clinical Data: No additional findings.  ROS:  All other systems negative, except as noted in the HPI. Review of Systems  Objective: Vital Signs: There were no vitals taken for this visit.  Specialty Comments:  No specialty comments available.  PMFS History: Patient Active Problem List   Diagnosis Date Noted   Subacute osteomyelitis, left ankle and foot (HCC) 11/30/2020   Abscess of bursa  of left ankle 11/30/2020   Acute osteomyelitis of toe, right (HCC)    Diabetic polyneuropathy associated with type 2 diabetes mellitus (HCC) 08/14/2016   Chronic venous hypertension (idiopathic) with inflammation of bilateral lower extremity 08/14/2016   Ulcer of left heel, limited to breakdown of skin (HCC) 05/15/2016   Amputated toe of right foot (HCC) 07/27/2015   Great toe pain 06/17/2014   Type II diabetes mellitus with peripheral circulatory disorder (HCC) 06/17/2014   Elevated transaminase level 06/17/2014   HTN (hypertension) 06/17/2014   Visual disturbance    Past Medical History:  Diagnosis Date   Blood transfusion 2009   Diabetes mellitus    type 2   Hyperlipemia    Hypertension    denies   Leg swelling    Osteomyelitis (HCC)    right 5th toe   Visual disturbance      Family History  Problem Relation Age of Onset   Cancer Mother        breast    Past Surgical History:  Procedure Laterality Date   AMPUTATION Left 2012   Left Great Toe   AMPUTATION Right 04/20/2015   Procedure: Right Great Toe Amputation;  Surgeon: [name], MD;  Location: [facility] OR;  Service: Orthopedics;  Laterality: Right;   AMPUTATION Right 07/27/2015   Procedure: Right 2nd Toe Amputation;  Surgeon: [name], MD;  Location: [facility] OR;  Service: Orthopedics;  Laterality: Right;   AMPUTATION Right 09/24/2016   Procedure: Right 5th Toe Amputation;  Surgeon: [name], MD;  Location: [facility] OR;  Service: Orthopedics;  Laterality: Right;   AMPUTATION Right 01/07/2017   Procedure: Right Foot 3rd and 4th Toe Amputation;  Surgeon: [name], MD;  Location: [facility] OR;  Service: Orthopedics;  Laterality: Right;   AMPUTATION Left 11/30/2020   Procedure: LEFT BELOW KNEE AMPUTATION;  Surgeon: [name], MD;  Location: [facility] OR;  Service: Orthopedics;  Laterality: Left;   CHOLECYSTECTOMY  03/2011   EYE SURGERY     lazer   foot surgery  11/2007   small toe on left foot removed.   NASAL SEPTUM SURGERY  1981   Due to broken nose. Surgery was to repair how bone healed.   PARS PLANA VITRECTOMY  11/17/2011   Procedure: PARS PLANA VITRECTOMY WITH 23 GAUGE;  Surgeon: Shade Flood, MD;  Location: [facility] OR;  Service: Ophthalmology;  Laterality: Right;  With Endolaser   TOE AMPUTATION Left    5 th   TONSILLECTOMY     Social History   Occupational History   Not on file  Tobacco Use   Smoking status: Former    Years: 5.00    Types: Cigarettes    Quit date: 03/25/1979    Years since quitting: 42.7   Smokeless tobacco: Never  Vaping Use   Vaping Use: Never used  Substance and Sexual Activity   Alcohol use: Not Currently    Comment: occasional  24 pk of malt liqour a month   Drug use: No   Sexual activity: Not on file

## 2021-12-10 ENCOUNTER — Telehealth: Payer: Self-pay | Admitting: Orthopedic Surgery

## 2021-12-17 ENCOUNTER — Emergency Department (HOSPITAL_COMMUNITY)
Admission: EM | Admit: 2021-12-17 | Discharge: 2021-12-17 | Disposition: A | Discharge status: Home / Self Care | Payer: Medicaid Other | Attending: Emergency Medicine | Admitting: Emergency Medicine

## 2021-12-17 ENCOUNTER — Other Ambulatory Visit: Payer: Self-pay

## 2021-12-17 ENCOUNTER — Encounter (HOSPITAL_COMMUNITY): Payer: Self-pay

## 2021-12-17 DIAGNOSIS — T83098A Other mechanical complication of other indwelling urethral catheter, initial encounter: Secondary | ICD-10-CM | POA: Insufficient documentation

## 2021-12-17 DIAGNOSIS — R739 Hyperglycemia, unspecified: Secondary | ICD-10-CM | POA: Insufficient documentation

## 2021-12-17 DIAGNOSIS — Z7982 Long term (current) use of aspirin: Secondary | ICD-10-CM | POA: Insufficient documentation

## 2021-12-17 DIAGNOSIS — T839XXA Unspecified complication of genitourinary prosthetic device, implant and graft, initial encounter: Secondary | ICD-10-CM

## 2021-12-17 LAB — CBG MONITORING, ED: Glucose-Capillary: 208 mg/dL — ABNORMAL HIGH (ref 70–99)

## 2021-12-17 NOTE — ED Notes (Signed)
flushed catheter no clots seen and urine is flowing freely into the catheter bag. Urine light red. Patient stated he was trying to pull cath.  out on his own at home. Patient reminded not to pull on cath.

## 2021-12-17 NOTE — ED Triage Notes (Signed)
Pt BIB EMS. Pt had a foley placed this morning and noticed some bleeding and a little pain with urination.

## 2021-12-17 NOTE — Discharge Instructions (Signed)
Call your urologist tomorrow, let them know that you had bleeding in your Foley and that it is now flowing better into the bag.  Return to the ER if you have fevers cannot urinate or have any additional concerns.

## 2021-12-17 NOTE — ED Provider Notes (Signed)
[facility]-EMERGENCY DEPT Provider Note   CSN: [id number] Arrival date & time: 12/17/21  0007     History  Chief Complaint  Patient presents with   Hematuria    [name] is a 61 y.o. male.  Patient presents to ER chief complaint of blood in his catheter.  He states that when he tries to urinate the urine seems to come from his penis to his depends and does not form to the catheter.  He tried to pull out his Foley without the balloon deflated complaining of some increased pain and increased bleeding.  Denies fevers or cough or vomiting or diarrhea no back pain or abdominal pain reported.       Home Medications Prior to Admission medications   Medication Sig Start Date End Date Taking? Authorizing Provider  ACCU-CHEK AVIVA PLUS test strip  06/10/16   [provider]  ACCU-CHEK SOFTCLIX LANCETS lancets  06/11/16   [provider]  ascorbic acid (VITAMIN C) 1000 MG tablet Take 1 tablet (1,000 mg total) by mouth daily. 12/03/20   Persons, [place], PA  aspirin 81 MG chewable tablet Chew 81 mg by mouth daily.    [provider]  BD ULTRA-FINE PEN NEEDLES 29G X 12.7MM MISC  05/30/16   [provider]  LANTUS SOLOSTAR 100 UNIT/ML Solostar Pen Inject 20 Units into the skin at bedtime.  07/10/16   [provider]  lisinopril (PRINIVIL,ZESTRIL) 5 MG tablet Take 5 mg by mouth daily.    [provider]  nutrition supplement, JUVEN, (JUVEN) PACK Take 1 packet by mouth 2 (two) times daily between meals. 12/03/20   Persons, [place], PA  oxyCODONE (OXY IR/ROXICODONE) 5 MG immediate release tablet Take 1-2 tablets (5-10 mg total) by mouth every 4 (four) hours as needed for moderate pain (pain score 4-6). 12/03/20   Persons, [place], PA  Polyethyl Glycol-Propyl Glycol (SYSTANE) 0.4-0.3 % GEL ophthalmic gel Place 1 application into both eyes daily as needed (Dry eye).    [provider]  simvastatin (ZOCOR) 10 MG  tablet Take 10 mg by mouth at bedtime.     [provider]  tamsulosin (FLOMAX) 0.4 MG CAPS capsule Take 0.4 mg by mouth daily. 11/14/20   [provider]  Vitamin D, Ergocalciferol, 50 MCG (2000 UT) CAPS Take 2,000 Units by mouth daily.    [provider]  zinc sulfate 220 (50 Zn) MG capsule Take 1 capsule (220 mg total) by mouth daily. 12/03/20   Persons, [place], PA      Allergies    No known allergies    Review of Systems   Review of Systems  Constitutional:  Negative for fever.  HENT:  Negative for ear pain and sore throat.   Eyes:  Negative for pain.  Respiratory:  Negative for cough.   Cardiovascular:  Negative for chest pain.  Gastrointestinal:  Negative for abdominal pain.  Genitourinary:  Negative for flank pain.  Musculoskeletal:  Negative for back pain.  Skin:  Negative for color change and rash.  Neurological:  Negative for syncope.  All other systems reviewed and are negative.   Physical Exam Updated Vital Signs BP (!) 184/86   Pulse 78   Temp 98 F (36.7 C) (Oral)   Resp 18   SpO2 99%  Physical Exam Constitutional:      Appearance: He is well-developed.  HENT:     Head: Normocephalic.     Nose: Nose normal.  Eyes:  Extraocular Movements: Extraocular movements intact.  Cardiovascular:     Rate and Rhythm: Normal rate.  Pulmonary:     Effort: Pulmonary effort is normal.  Abdominal:     Tenderness: There is no abdominal tenderness. There is no guarding or rebound.  Genitourinary:    Comments: Small amount of blood in visualized around the catheter insertion site.  Catheter bag has pink-tinged urine. Musculoskeletal:     Comments: Left lower extremity BKA  Skin:    Coloration: Skin is not jaundiced.  Neurological:     Mental Status: He is alert. Mental status is at baseline.     ED Results / Procedures / Treatments   Labs (all labs ordered are listed, but only abnormal results are displayed) Labs Reviewed  CBG  MONITORING, ED - Abnormal; Notable for the following components:      Result Value   Glucose-Capillary 208 (*)    All other components within normal limits    EKG None  Radiology No results found.  Procedures Procedures    Medications Ordered in ED Medications - No data to display  ED Course/ Medical Decision Making/ A&P                           Medical Decision Making  Review of records shows outpatient visit for BKA November 28, 2021.  Cardiac monitoring showing sinus rhythm.  Foley catheter was flushed, pink urine flushed out.  Flowing freely now into the urine bag.  Patient denies any pain or discomfort.  Recommending outpatient follow-up with urology this week.  Recommending immediate return for worsening symptoms or any additional concerns.        Final Clinical Impression(s) / ED Diagnoses Final diagnoses:  Complication of Foley catheter, initial encounter [facility]    Rx / DC Orders ED Discharge Orders     None         Cheryll Cockayne, MD 12/17/21 [phone]

## 2021-12-31 ENCOUNTER — Ambulatory Visit (INDEPENDENT_AMBULATORY_CARE_PROVIDER_SITE_OTHER): Payer: Medicaid Other | Admitting: Orthopedic Surgery

## 2021-12-31 DIAGNOSIS — I890 Lymphedema, not elsewhere classified: Secondary | ICD-10-CM

## 2021-12-31 DIAGNOSIS — Z89512 Acquired absence of left leg below knee: Secondary | ICD-10-CM

## 2022-01-14 ENCOUNTER — Encounter: Payer: Self-pay | Admitting: Orthopedic Surgery

## 2022-01-14 NOTE — Progress Notes (Signed)
Office Visit Note   Patient: [name]           Date of Birth: 05/24/1961           MRN: [id number] Visit Date: 12/31/2021              Requested by: Fleet Contras, MD [address] [place],  [place] [address] PCP: Fleet Contras, MD  Chief Complaint  Patient presents with   Left Leg - Follow-up      HPI: Patient is a 61 year old gentl lady with swelling of the residual limb as well.  There is no cellulitis no odor or drainage no signs of infection.  There is induration of the skin.  eman who presents in follow-up for left transtibial amputation with lymphedema.  Patient is to be fit with temporary pumps today to evaluate his response to the sequential compression.  Assessment & Plan: Visit Diagnoses: No diagnosis found.  Plan: Plan to proceed with lymphedema pumps.  Follow-Up Instructions: Return in about 4 weeks (around 01/28/2022).   Ortho Exam  Patient is alert, oriented, no adenopathy, well-dressed, normal affect, normal respiratory effort. Examination patient has lymphedema swelling of the left lower EXTR  Imaging: No results found. No images are attached to the encounter.  Labs: Lab Results  Component Value Date   HGBA1C 6.4 (H) 11/30/2020   HGBA1C 7.1 (H) 09/24/2016   HGBA1C 6.8 (H) 06/17/2014   ESRSEDRATE 45 (H) 06/17/2014   CRP 1.5 (H) 06/17/2014   REPTSTATUS 06/19/2014 FINAL 06/17/2014   GRAMSTAIN  06/17/2014    RARE WBC PRESENT, PREDOMINANTLY PMN NO SQUAMOUS EPITHELIAL CELLS SEEN NO ORGANISMS SEEN Performed at [facility]    CULT  06/17/2014    MULTIPLE ORGANISMS PRESENT, NONE PREDOMINANT Note: NO STAPHYLOCOCCUS AUREUS ISOLATED NO GROUP A STREP (S.PYOGENES) ISOLATED Performed at [facility]    LABORGA STAPHYLOCOCCUS AUREUS 11/16/2007   LABORGA MORGANELLA MORGANII 11/16/2007     Lab Results  Component Value Date   ALBUMIN 3.4 (L) 07/27/2015   ALBUMIN 3.3 (L) 04/20/2015   ALBUMIN 3.6 06/17/2014    Lab Results   Component Value Date   MG 1.8 11/26/2007   No results found for: "VD25OH"  No results found for: "PREALBUMIN"    Latest Ref Rng & Units 12/02/2020    1:32 AM 12/01/2020    7:36 AM 11/30/2020    2:00 PM  CBC EXTENDED  WBC 4.0 - 10.5 K/uL 9.7  9.6  10.7   RBC 4.22 - 5.81 MIL/uL 3.64  3.64  4.23   Hemoglobin 13.0 - 17.0 g/dL 9.2  9.5  62.1   HCT 30.8 - 52.0 % 30.1  29.8  35.7   Platelets 150 - 400 K/uL 271  278  286      There is no height or weight on file to calculate BMI.  Orders:  No orders of the defined types were placed in this encounter.  No orders of the defined types were placed in this encounter.    Procedures: No procedures performed  Clinical Data: No additional findings.  ROS:  All other systems negative, except as noted in the HPI. Review of Systems  Objective: Vital Signs: There were no vitals taken for this visit.  Specialty Comments:  No specialty comments available.  PMFS History: Patient Active Problem List   Diagnosis Date Noted   Subacute osteomyelitis, left ankle and foot (HCC) 11/30/2020   Abscess of bursa of left ankle 11/30/2020   Acute osteomyelitis of toe,  right [facility])    Diabetic polyneuropathy associated with type 2 diabetes mellitus (HCC) 08/14/2016   Chronic venous hypertension (idiopathic) with inflammation of bilateral lower extremity 08/14/2016   Ulcer of left heel, limited to breakdown of skin (HCC) 05/15/2016   Amputated toe of right foot (HCC) 07/27/2015   Great toe pain 06/17/2014   Type II diabetes mellitus with peripheral circulatory disorder (HCC) 06/17/2014   Elevated transaminase level 06/17/2014   HTN (hypertension) 06/17/2014   Visual disturbance    Past Medical History:  Diagnosis Date   Blood transfusion 2009   Diabetes mellitus    type 2   Hyperlipemia    Hypertension    denies   Leg swelling    Osteomyelitis (HCC)    right 5th toe   Visual disturbance     Family History  Problem Relation Age of  Onset   Cancer Mother        breast    Past Surgical History:  Procedure Laterality Date   AMPUTATION Left 2012   Left Great Toe   AMPUTATION Right 04/20/2015   Procedure: Right Great Toe Amputation;  Surgeon: [name], MD;  Location: [facility] OR;  Service: Orthopedics;  Laterality: Right;   AMPUTATION Right 07/27/2015   Procedure: Right 2nd Toe Amputation;  Surgeon: [name], MD;  Location: [facility] OR;  Service: Orthopedics;  Laterality: Right;   AMPUTATION Right 09/24/2016   Procedure: Right 5th Toe Amputation;  Surgeon: [name], MD;  Location: [facility] OR;  Service: Orthopedics;  Laterality: Right;   AMPUTATION Right 01/07/2017   Procedure: Right Foot 3rd and 4th Toe Amputation;  Surgeon: [name], MD;  Location: [facility] OR;  Service: Orthopedics;  Laterality: Right;   AMPUTATION Left 11/30/2020   Procedure: LEFT BELOW KNEE AMPUTATION;  Surgeon: [name], MD;  Location: [facility] OR;  Service: Orthopedics;  Laterality: Left;   CHOLECYSTECTOMY  03/2011   EYE SURGERY     lazer   foot surgery  11/2007   small toe on left foot removed.   NASAL SEPTUM SURGERY  1981   Due to broken nose. Surgery was to repair how bone healed.   PARS PLANA VITRECTOMY  11/17/2011   Procedure: PARS PLANA VITRECTOMY WITH 23 GAUGE;  Surgeon: Shade Flood, MD;  Location[facility] OR;  Service: Ophthalmology;  Laterality: Right;  With Endolaser   TOE AMPUTATION Left    5 th   TONSILLECTOMY     Social History   Occupational History   Not on file  Tobacco Use   Smoking status: Former    Years: 5.00    Types: Cigarettes    Quit date: 03/25/1979    Years since quitting: 42.8   Smokeless tobacco: Never  Vaping Use   Vaping Use: Never used  Substance and Sexual Activity   Alcohol use: Not Currently    Comment: occasional  24 pk of malt liqour a month   Drug use: No   Sexual activity: Not on file

## 2022-01-21 ENCOUNTER — Telehealth: Payer: Self-pay | Admitting: Orthopedic Surgery

## 2022-01-21 NOTE — Telephone Encounter (Signed)
Message sent to [facility] with [employer] to call pt and explain. There is something pending about a a custom order and I want her to be able to give the pt all of the details. Will hold this message pending confirmation that she will reach out to the pt tomorrow.

## 2022-01-21 NOTE — Telephone Encounter (Signed)
Patient called advised he have not received the Lipedema pump yet. Patient asked if [name] would give him a call. The number to contact patient is [phone]

## 2022-01-22 ENCOUNTER — Telehealth: Payer: Self-pay | Admitting: Orthopedic Surgery

## 2022-01-22 NOTE — Telephone Encounter (Signed)
Patient called in requesting [name] give him a call about what he is supposed to use to get the water off him, please call him and advise on what he is supposed to do

## 2022-01-22 NOTE — Telephone Encounter (Signed)
[employer] rep just responded to advise she will contact pt today and advise that his pump is a custom order and that it does take bit longer to process. It is just now being submitted ot the insurance company and this can take about  business days. She will explain all of this to the pt today.

## 2022-01-22 NOTE — Telephone Encounter (Signed)
This is sa duplicate message. I will sign off on this and respond to the other. The lymphedema pumps are what we are needing to help with this and I will discuss with the pt where [employer] is in the process of getting this delivered to him once I hear back from the company. I have sent to emails to the rep and hope to hear back by the end of day.

## 2022-01-22 NOTE — Telephone Encounter (Signed)
Message sent to [employer] again this morning to find out the status of the lymphedema pumps. I have asked megan to reach out to thte pt today or at least tell me what the status is so that I can relay that information to the pt. I will hold this message and await a return call. I will call the pt by the end of the day if I have not heard back from [facility] and let him know I am working on getting answers for him.

## 2022-02-04 ENCOUNTER — Encounter: Payer: Self-pay | Admitting: Orthopedic Surgery

## 2022-02-04 ENCOUNTER — Ambulatory Visit (INDEPENDENT_AMBULATORY_CARE_PROVIDER_SITE_OTHER): Payer: Medicaid Other | Admitting: Orthopedic Surgery

## 2022-02-04 DIAGNOSIS — I890 Lymphedema, not elsewhere classified: Secondary | ICD-10-CM

## 2022-02-04 DIAGNOSIS — Z89512 Acquired absence of left leg below knee: Secondary | ICD-10-CM

## 2022-02-04 NOTE — Progress Notes (Signed)
Office Visit Note   Patient: [name]           Date of Birth: September 21, 1960           MRN: [id number] Visit Date: 02/04/2022              Requested by: Fleet Contras, MD [address] [place],  [place] [address] PCP: Fleet Contras, MD  Chief Complaint  Patient presents with   Left Leg - Edema    Hx left BKA      HPI: Patient is a 61 year old gentleman who is seen in follow-up for left transtibial amputation with lymphedema.  Patient states that the lymphedema pumps were authorized but he has not received them yet.  Assessment & Plan: Visit Diagnoses:  1. Lymphedema   2. Hx of BKA, left (HCC)     Plan: We we will follow-up on the lymphedema pumps continue with his 3 layer compression socks.  Follow-Up Instructions: Return in about 4 weeks (around 03/04/2022).   Ortho Exam  Patient is alert, oriented, no adenopathy, well-dressed, normal affect, normal respiratory effort. Examination patient has increasing lymphedema of the left thigh and left transtibial amputation.  The amputation is getting longer from the buildup of the lymphatic fluid there is no cellulitis no drainage no signs of infection.  Imaging: No results found. No images are attached to the encounter.  Labs: Lab Results  Component Value Date   HGBA1C 6.4 (H) 11/30/2020   HGBA1C 7.1 (H) 09/24/2016   HGBA1C 6.8 (H) 06/17/2014   ESRSEDRATE 45 (H) 06/17/2014   CRP 1.5 (H) 06/17/2014   REPTSTATUS 06/19/2014 FINAL 06/17/2014   GRAMSTAIN  06/17/2014    RARE WBC PRESENT, PREDOMINANTLY PMN NO SQUAMOUS EPITHELIAL CELLS SEEN NO ORGANISMS SEEN Performed at [facility]    CULT  06/17/2014    MULTIPLE ORGANISMS PRESENT, NONE PREDOMINANT Note: NO STAPHYLOCOCCUS AUREUS ISOLATED NO GROUP A STREP (S.PYOGENES) ISOLATED Performed at [facility]    LABORGA STAPHYLOCOCCUS AUREUS 11/16/2007   LABORGA MORGANELLA MORGANII 11/16/2007     Lab Results  Component Value Date   ALBUMIN 3.4 (L)  07/27/2015   ALBUMIN 3.3 (L) 04/20/2015   ALBUMIN 3.6 06/17/2014    Lab Results  Component Value Date   MG 1.8 11/26/2007   No results found for: "VD25OH"  No results found for: "PREALBUMIN"    Latest Ref Rng & Units 12/02/2020    1:32 AM 12/01/2020    7:36 AM 11/30/2020    2:00 PM  CBC EXTENDED  WBC 4.0 - 10.5 K/uL 9.7  9.6  10.7   RBC 4.22 - 5.81 MIL/uL 3.64  3.64  4.23   Hemoglobin 13.0 - 17.0 g/dL 9.2  9.5  73.5   HCT 32.9 - 52.0 % 30.1  29.8  35.7   Platelets 150 - 400 K/uL 271  278  286      There is no height or weight on file to calculate BMI.  Orders:  No orders of the defined types were placed in this encounter.  No orders of the defined types were placed in this encounter.    Procedures: No procedures performed  Clinical Data: No additional findings.  ROS:  All other systems negative, except as noted in the HPI. Review of Systems  Objective: Vital Signs: There were no vitals taken for this visit.  Specialty Comments:  No specialty comments available.  PMFS History: Patient Active Problem List   Diagnosis Date Noted   Subacute osteomyelitis, left  ankle and foot (HCC) 11/30/2020   Abscess of bursa of left ankle 11/30/2020   Acute osteomyelitis of toe, right (HCC)    Diabetic polyneuropathy associated with type 2 diabetes mellitus (HCC) 08/14/2016   Chronic venous hypertension (idiopathic) with inflammation of bilateral lower extremity 08/14/2016   Ulcer of left heel, limited to breakdown of skin (HCC) 05/15/2016   Amputated toe of right foot (HCC) 07/27/2015   Great toe pain 06/17/2014   Type II diabetes mellitus with peripheral circulatory disorder (HCC) 06/17/2014   Elevated transaminase level 06/17/2014   HTN (hypertension) 06/17/2014   Visual disturbance    Past Medical History:  Diagnosis Date   Blood transfusion 2009   Diabetes mellitus    type 2   Hyperlipemia    Hypertension    denies   Leg swelling    Osteomyelitis (HCC)     right 5th toe   Visual disturbance     Family History  Problem Relation Age of Onset   Cancer Mother        breast    Past Surgical History:  Procedure Laterality Date   AMPUTATION Left 2012   Left Great Toe   AMPUTATION Right 04/20/2015   Procedure: Right Great Toe Amputation;  Surgeon: [name], MD;  Location: [facility] OR;  Service: Orthopedics;  Laterality: Right;   AMPUTATION Right 07/27/2015   Procedure: Right 2nd Toe Amputation;  Surgeon: [name], MD;  Location: [facility] OR;  Service: Orthopedics;  Laterality: Right;   AMPUTATION Right 09/24/2016   Procedure: Right 5th Toe Amputation;  Surgeon: [name], MD;  Location: [facility] OR;  Service: Orthopedics;  Laterality: Right;   AMPUTATION Right 01/07/2017   Procedure: Right Foot 3rd and 4th Toe Amputation;  Surgeon: [name], MD;  Location: [facility] OR;  Service: Orthopedics;  Laterality: Right;   AMPUTATION Left 11/30/2020   Procedure: LEFT BELOW KNEE AMPUTATION;  Surgeon: [name], MD;  Location: [facility] OR;  Service: Orthopedics;  Laterality: Left;   CHOLECYSTECTOMY  03/2011   EYE SURGERY     lazer   foot surgery  11/2007   small toe on left foot removed.   NASAL SEPTUM SURGERY  1981   Due to broken nose. Surgery was to repair how bone healed.   PARS PLANA VITRECTOMY  11/17/2011   Procedure: PARS PLANA VITRECTOMY WITH 23 GAUGE;  Surgeon: Shade Flood, MD;  Location: [facility] OR;  Service: Ophthalmology;  Laterality: Right;  With Endolaser   TOE AMPUTATION Left    5 th   TONSILLECTOMY     Social History   Occupational History   Not on file  Tobacco Use   Smoking status: Former    Years: 5.00    Types: Cigarettes    Quit date: 03/25/1979    Years since quitting: 42.8   Smokeless tobacco: Never  Vaping Use   Vaping Use: Never used  Substance and Sexual Activity   Alcohol use: Not Currently    Comment: occasional  24 pk of malt liqour a month   Drug use: No   Sexual activity: Not on file

## 2022-02-18 ENCOUNTER — Other Ambulatory Visit: Payer: Self-pay | Admitting: Internal Medicine

## 2022-02-19 LAB — CBC
HCT: 25.2 % — ABNORMAL LOW (ref 38.5–50.0)
Hemoglobin: 7.8 g/dL — ABNORMAL LOW (ref 13.2–17.1)
MCH: 25.5 pg — ABNORMAL LOW (ref 27.0–33.0)
MCHC: 31 g/dL — ABNORMAL LOW (ref 32.0–36.0)
MCV: 82.4 fL (ref 80.0–100.0)
MPV: 9.7 fL (ref 7.5–12.5)
Platelets: 314 10*3/uL (ref 140–400)
RBC: 3.06 10*6/uL — ABNORMAL LOW (ref 4.20–5.80)
RDW: 12.6 % (ref 11.0–15.0)
WBC: 9.5 10*3/uL (ref 3.8–10.8)

## 2022-02-19 LAB — COMPLETE METABOLIC PANEL WITH GFR
AG Ratio: 0.8 (calc) — ABNORMAL LOW (ref 1.0–2.5)
ALT: 5 U/L — ABNORMAL LOW (ref 9–46)
AST: 11 U/L (ref 10–35)
Albumin: 3 g/dL — ABNORMAL LOW (ref 3.6–5.1)
Alkaline phosphatase (APISO): 47 U/L (ref 35–144)
BUN/Creatinine Ratio: 15 (calc) (ref 6–22)
BUN: 31 mg/dL — ABNORMAL HIGH (ref 7–25)
CO2: 24 mmol/L (ref 20–32)
Calcium: 8.3 mg/dL — ABNORMAL LOW (ref 8.6–10.3)
Chloride: 101 mmol/L (ref 98–110)
Creat: 2.13 mg/dL — ABNORMAL HIGH (ref 0.70–1.35)
Globulin: 3.6 g/dL (calc) (ref 1.9–3.7)
Glucose, Bld: 103 mg/dL — ABNORMAL HIGH (ref 65–99)
Potassium: 3.7 mmol/L (ref 3.5–5.3)
Sodium: 140 mmol/L (ref 135–146)
Total Bilirubin: 0.3 mg/dL (ref 0.2–1.2)
Total Protein: 6.6 g/dL (ref 6.1–8.1)
eGFR: 34 mL/min/{1.73_m2} — ABNORMAL LOW (ref >=60)

## 2022-02-19 LAB — LIPID PANEL
Cholesterol: 104 mg/dL (ref <200)
HDL: 42 mg/dL (ref >=40)
LDL Cholesterol (Calc): 48 mg/dL (calc)
Non-HDL Cholesterol (Calc): 62 mg/dL (calc) (ref <130)
Total CHOL/HDL Ratio: 2.5 (calc) (ref <5.0)
Triglycerides: 48 mg/dL (ref <150)

## 2022-02-19 LAB — PSA: PSA: 2.85 ng/mL (ref <=4.00)

## 2022-02-19 LAB — VITAMIN D 25 HYDROXY (VIT D DEFICIENCY, FRACTURES): Vit D, 25-Hydroxy: 33 ng/mL (ref 30–100)

## 2022-02-19 LAB — TSH: TSH: 1.23 mIU/L (ref 0.40–4.50)

## 2022-02-27 ENCOUNTER — Other Ambulatory Visit: Payer: Self-pay | Admitting: Internal Medicine

## 2022-02-28 LAB — BASIC METABOLIC PANEL WITH GFR
BUN/Creatinine Ratio: 12 (calc) (ref 6–22)
BUN: 22 mg/dL (ref 7–25)
CO2: 28 mmol/L (ref 20–32)
Calcium: 8.5 mg/dL — ABNORMAL LOW (ref 8.6–10.3)
Chloride: 100 mmol/L (ref 98–110)
Creat: 1.77 mg/dL — ABNORMAL HIGH (ref 0.70–1.35)
Glucose, Bld: 91 mg/dL (ref 65–99)
Potassium: 4.2 mmol/L (ref 3.5–5.3)
Sodium: 138 mmol/L (ref 135–146)
eGFR: 43 mL/min/{1.73_m2} — ABNORMAL LOW (ref >=60)

## 2022-02-28 LAB — EXTRA LAV TOP TUBE

## 2022-03-11 ENCOUNTER — Ambulatory Visit: Payer: Medicaid Other | Admitting: Orthopedic Surgery

## 2022-03-13 ENCOUNTER — Ambulatory Visit: Payer: Medicaid Other | Admitting: Orthopedic Surgery

## 2022-03-27 ENCOUNTER — Encounter: Payer: Self-pay | Admitting: Orthopedic Surgery

## 2022-03-27 ENCOUNTER — Ambulatory Visit (INDEPENDENT_AMBULATORY_CARE_PROVIDER_SITE_OTHER): Payer: Medicaid Other | Admitting: Orthopedic Surgery

## 2022-03-27 DIAGNOSIS — I890 Lymphedema, not elsewhere classified: Secondary | ICD-10-CM | POA: Diagnosis not present

## 2022-03-27 DIAGNOSIS — L97921 Non-pressure chronic ulcer of unspecified part of left lower leg limited to breakdown of skin: Secondary | ICD-10-CM | POA: Diagnosis not present

## 2022-03-27 DIAGNOSIS — Z89512 Acquired absence of left leg below knee: Secondary | ICD-10-CM

## 2022-03-27 NOTE — Progress Notes (Signed)
Office Visit Note   Patient: [name]           Date of Birth: 27-May-1961           MRN: [id number] Visit Date: 03/27/2022              Requested by: Fleet Contras, MD [address] [place],  [place] [address] PCP: Fleet Contras, MD  Chief Complaint  Patient presents with   Right Leg - Follow-up   Left Leg - Follow-up    Hx BKA 11/30/2020      HPI: Patient is a 61 year old gentleman with venous and lymphatic insufficiency who had started with lymphedema pumps.  He states at this time he does not know how to apply the garments.  Patient states that while he was able to use the pumps he had significant decrease swelling in the left lower extremity.  Assessment & Plan: Visit Diagnoses:  1. Lymphedema   2. Hx of BKA, left (HCC)     Plan: We will notify [employer] to provide further in service.  We will place an order for physical therapy for patient to begin gait training with his prosthesis.  Follow-Up Instructions: Return in about 4 weeks (around 04/24/2022).   Ortho Exam  Patient is alert, oriented, no adenopathy, well-dressed, normal affect, normal respiratory effort. Examination patient has significant decrease swelling in the left thigh and left lower extremity.  The ulcers have healed the leg and thigh are soft with decreased swelling there is no cellulitis no drainage.  Imaging: No results found. No images are attached to the encounter.  Labs: Lab Results  Component Value Date   HGBA1C 6.4 (H) 11/30/2020   HGBA1C 7.1 (H) 09/24/2016   HGBA1C 6.8 (H) 06/17/2014   ESRSEDRATE 45 (H) 06/17/2014   CRP 1.5 (H) 06/17/2014   REPTSTATUS 06/19/2014 FINAL 06/17/2014   GRAMSTAIN  06/17/2014    RARE WBC PRESENT, PREDOMINANTLY PMN NO SQUAMOUS EPITHELIAL CELLS SEEN NO ORGANISMS SEEN Performed at [facility]    CULT  06/17/2014    MULTIPLE ORGANISMS PRESENT, NONE PREDOMINANT Note: NO STAPHYLOCOCCUS AUREUS ISOLATED NO GROUP A STREP (S.PYOGENES)  ISOLATED Performed at [facility]    LABORGA STAPHYLOCOCCUS AUREUS 11/16/2007   LABORGA MORGANELLA MORGANII 11/16/2007     Lab Results  Component Value Date   ALBUMIN 3.4 (L) 07/27/2015   ALBUMIN 3.3 (L) 04/20/2015   ALBUMIN 3.6 06/17/2014    Lab Results  Component Value Date   MG 1.8 11/26/2007   Lab Results  Component Value Date   VD25OH 33 02/18/2022    No results found for: "PREALBUMIN"    Latest Ref Rng & Units 02/18/2022    9:30 AM 12/02/2020    1:32 AM 12/01/2020    7:36 AM  CBC EXTENDED  WBC 3.8 - 10.8 Thousand/uL 9.5  9.7  9.6   RBC 4.20 - 5.80 Million/uL 3.06  3.64  3.64   Hemoglobin 13.2 - 17.1 g/dL 7.8  9.2  9.5   HCT 70.3 - 50.0 % 25.2  30.1  29.8   Platelets 140 - 400 Thousand/uL 314  271  278      There is no height or weight on file to calculate BMI.  Orders:  No orders of the defined types were placed in this encounter.  No orders of the defined types were placed in this encounter.    Procedures: No procedures performed  Clinical Data: No additional findings.  ROS:  All other systems negative, except  as noted in the HPI. Review of Systems  Objective: Vital Signs: There were no vitals taken for this visit.  Specialty Comments:  No specialty comments available.  PMFS History: Patient Active Problem List   Diagnosis Date Noted   Subacute osteomyelitis, left ankle and foot ([place]) 11/30/2020   Abscess of bursa of left ankle 11/30/2020   Acute osteomyelitis of toe, right ([place])    Diabetic polyneuropathy associated with type 2 diabetes mellitus ([place]) 08/14/2016   Chronic venous hypertension (idiopathic) with inflammation of bilateral lower extremity 08/14/2016   Ulcer of left heel, limited to breakdown of skin ([place]) 05/15/2016   Amputated toe of right foot ([place]) 07/27/2015   Great toe pain 06/17/2014   Type II diabetes mellitus with peripheral circulatory disorder ([place]) 06/17/2014   Elevated transaminase level 06/17/2014   HTN  (hypertension) 06/17/2014   Visual disturbance    Past Medical History:  Diagnosis Date   Blood transfusion 2009   Diabetes mellitus    type 2   Hyperlipemia    Hypertension    denies   Leg swelling    Osteomyelitis (HCC)    right 5th toe   Visual disturbance     Family History  Problem Relation Age of Onset   Cancer Mother        breast    Past Surgical History:  Procedure Laterality Date   AMPUTATION Left 2012   Left Great Toe   AMPUTATION Right 04/20/2015   Procedure: Right Great Toe Amputation;  Surgeon: Newt Minion, MD;  Location: [place];  Service: Orthopedics;  Laterality: Right;   AMPUTATION Right 07/27/2015   Procedure: Right 2nd Toe Amputation;  Surgeon: Newt Minion, MD;  Location: [place];  Service: Orthopedics;  Laterality: Right;   AMPUTATION Right 09/24/2016   Procedure: Right 5th Toe Amputation;  Surgeon: Newt Minion, MD;  Location: [place];  Service: Orthopedics;  Laterality: Right;   AMPUTATION Right 01/07/2017   Procedure: Right Foot 3rd and 4th Toe Amputation;  Surgeon: Newt Minion, MD;  Location: [place];  Service: Orthopedics;  Laterality: Right;   AMPUTATION Left 11/30/2020   Procedure: LEFT BELOW KNEE AMPUTATION;  Surgeon: Newt Minion, MD;  Location: [place];  Service: Orthopedics;  Laterality: Left;   CHOLECYSTECTOMY  03/2011   EYE SURGERY     lazer   foot surgery  11/2007   small toe on left foot removed.   NASAL SEPTUM SURGERY  1981   Due to broken nose. Surgery was to repair how bone healed.   PARS PLANA VITRECTOMY  11/17/2011   Procedure: PARS PLANA VITRECTOMY WITH 23 GAUGE;  Surgeon: Adonis Brook, MD;  Location: [place];  Service: Ophthalmology;  Laterality: Right;  With Endolaser   TOE AMPUTATION Left    5 th   TONSILLECTOMY     Social History   Occupational History   Not on file  Tobacco Use   Smoking status: Former    Years: 5.00    Types: Cigarettes    Quit date: 03/25/1979    Years since quitting: 43.0   Smokeless tobacco: Never   Vaping Use   Vaping Use: Never used  Substance and Sexual Activity   Alcohol use: Not Currently    Comment: occasional  24 pk of malt liqour a month   Drug use: No   Sexual activity: Not on file

## 2022-04-14 ENCOUNTER — Other Ambulatory Visit: Payer: Self-pay | Admitting: Urology

## 2022-04-15 ENCOUNTER — Other Ambulatory Visit: Payer: Self-pay | Admitting: Urology

## 2022-04-15 NOTE — Progress Notes (Signed)
  Spoke with Zona @ [facility]. There are orders in but we can not see them because they a 2nd sign by Dr. Abner Greenspan.

## 2022-04-21 NOTE — Patient Instructions (Signed)
SURGICAL WAITING ROOM VISITATION Patients having surgery or a procedure may have no more than 2 support people in the waiting area - these visitors may rotate.   Children under the age of 60 must have an adult with them who is not the patient. If the patient needs to stay at the hospital during part of their recovery, the visitor guidelines for inpatient rooms apply. Pre-op nurse will coordinate an appropriate time for 1 support person to accompany patient in pre-op.  This support person may not rotate.    Please refer to the [facility] website for the visitor guidelines for Inpatients (after your surgery is over and you are in a regular room).    Your procedure is scheduled on: 04/28/22   Report to [facility] Main Entrance    Report to admitting at 11:30 AM   Call this number if you have problems the morning of surgery [phone]   Follow clear liquid diet the day before surgery    Do not eat food :After Midnight.   After Midnight you may have the following liquids until ______ AM/ PM DAY OF SURGERY  Water Non-Citrus Juices (without pulp, NO RED) Carbonated Beverages Black Coffee (NO MILK/CREAM OR CREAMERS, sugar ok)  Clear Tea (NO MILK/CREAM OR CREAMERS, sugar ok) regular and decaf                             Plain Jell-O (NO RED)                                           Fruit ices (not with fruit pulp, NO RED)                                     Popsicles (NO RED)                                                               Sports drinks like Gatorade (NO RED)                      If you have questions, please contact your surgeon's office.   FOLLOW BOWEL PREP AND ANY ADDITIONAL PRE OP INSTRUCTIONS YOU RECEIVED FROM YOUR SURGEON'S OFFICE!!!     Oral Hygiene is also important to reduce your risk of infection.                                    Remember - BRUSH YOUR TEETH THE MORNING OF SURGERY WITH YOUR REGULAR TOOTHPASTE   Do NOT smoke after Midnight   Take  these medicines the morning of surgery with A SIP OF WATER: Flomax  DO NOT TAKE ANY ORAL DIABETIC MEDICATIONS DAY OF YOUR SURGERY  How to Manage Your Diabetes Before and After Surgery  Why is it important to control my blood sugar before and after surgery? Improving blood sugar levels before and after surgery helps healing and can limit problems. A way of improving  blood sugar control is eating a healthy diet by:  Eating less sugar and carbohydrates  Increasing activity/exercise  Talking with your doctor about reaching your blood sugar goals High blood sugars (greater than 180 mg/dL) can raise your risk of infections and slow your recovery, so you will need to focus on controlling your diabetes during the weeks before surgery. Make sure that the doctor who takes care of your diabetes knows about your planned surgery including the date and location.  How do I manage my blood sugar before surgery? Check your blood sugar at least 4 times a day, starting 2 days before surgery, to make sure that the level is not too high or low. Check your blood sugar the morning of your surgery when you wake up and every 2 hours until you get to the Short Stay unit. If your blood sugar is less than 70 mg/dL, you will need to treat for low blood sugar: Do not take insulin. Treat a low blood sugar (less than 70 mg/dL) with  cup of clear juice (cranberry or apple), 4 glucose tablets, OR glucose gel. Recheck blood sugar in 15 minutes after treatment (to make sure it is greater than 70 mg/dL). If your blood sugar is not greater than 70 mg/dL on recheck, call [phone] for further instructions. Report your blood sugar to the short stay nurse when you get to Short Stay.  If you are admitted to the hospital after surgery: Your blood sugar will be checked by the staff and you will probably be given insulin after surgery (instead of oral diabetes medicines) to make sure you have good blood sugar levels. The goal for  blood sugar control after surgery is 80-180 mg/dL.   WHAT DO I DO ABOUT MY DIABETES MEDICATION?  Do not take oral diabetes medicines (pills) the morning of surgery.  THE DAY BEFORE SURGERY, take 50% of Lantus at bedtime.     Reviewed and Endorsed by [facility] Patient Education Committee, August 2015  Bring CPAP mask and tubing day of surgery.                              You may not have any metal on your body including jewelry, and body piercing             Do not wear lotions, powders, cologne, or deodorant              Men may shave face and neck.   Do not bring valuables to the hospital. [facility] IS NOT             RESPONSIBLE   FOR VALUABLES.   Contacts, dentures or bridgework may not be worn into surgery.   Bring small overnight bag day of surgery.   DO NOT BRING YOUR HOME MEDICATIONS TO THE HOSPITAL. PHARMACY WILL DISPENSE MEDICATIONS LISTED ON YOUR MEDICATION LIST TO YOU DURING YOUR ADMISSION IN THE HOSPITAL!   Special Instructions: Bring a copy of your healthcare power of attorney and living will documents the day of surgery if you haven't scanned them before.              Please read over the following fact sheets you were given: IF YOU HAVE QUESTIONS ABOUT YOUR PRE-OP INSTRUCTIONS PLEASE CALL [phone] Contras    If you received a COVID test during your pre-op visit  it is requested that you wear a mask when out in public, stay  away from anyone that may not be feeling well and notify your surgeon if you develop symptoms. If you test positive for Covid or have been in contact with anyone that has tested positive in the last 10 days please notify you surgeon.    [facility] - Preparing for Surgery Before surgery, you can play an important role.  Because skin is not sterile, your skin needs to be as free of germs as possible.  You can reduce the number of germs on your skin by washing with CHG (chlorahexidine gluconate) soap before surgery.  CHG is an antiseptic  cleaner which kills germs and bonds with the skin to continue killing germs even after washing. Please DO NOT use if you have an allergy to CHG or antibacterial soaps.  If your skin becomes reddened/irritated stop using the CHG and inform your nurse when you arrive at Short Stay. Do not shave (including legs and underarms) for at least 48 hours prior to the first CHG shower.  You may shave your face/neck.  Please follow these instructions carefully:  1.  Shower with CHG Soap the night before surgery and the  morning of surgery.  2.  If you choose to wash your hair, wash your hair first as usual with your normal  shampoo.  3.  After you shampoo, rinse your hair and body thoroughly to remove the shampoo.                             4.  Use CHG as you would any other liquid soap.  You can apply chg directly to the skin and wash.  Gently with a scrungie or clean washcloth.  5.  Apply the CHG Soap to your body ONLY FROM THE NECK DOWN.   Do   not use on face/ open                           Wound or open sores. Avoid contact with eyes, ears mouth and   genitals (private parts).                       Wash face,  Genitals (private parts) with your normal soap.             6.  Wash thoroughly, paying special attention to the area where your    surgery  will be performed.  7.  Thoroughly rinse your body with warm water from the neck down.  8.  DO NOT shower/wash with your normal soap after using and rinsing off the CHG Soap.                9.  Pat yourself dry with a clean towel.            10.  Wear clean pajamas.            11.  Place clean sheets on your bed the night of your first shower and do not  sleep with pets. Day of Surgery : Do not apply any lotions/deodorants the morning of surgery.  Please wear clean clothes to the hospital/surgery center.  FAILURE TO FOLLOW THESE INSTRUCTIONS MAY RESULT IN THE CANCELLATION OF YOUR SURGERY  PATIENT SIGNATURE_________________________________  NURSE  SIGNATURE__________________________________  ________________________________________________________________________

## 2022-04-21 NOTE — Progress Notes (Addendum)
COVID Vaccine Completed: no  Date of COVID positive in last 90 days: no  PCP - Fleet Contras, MD Cardiologist - n/a  Clearance by Carlena Bjornstad on chart  Chest x-ray - n/a EKG - 04/22/22 Epic/chart Stress Test - n/a ECHO - 2005 Cardiac Cath - n/a Pacemaker/ICD device last checked: no Spinal Cord Stimulator: no  Bowel Prep - clears day before  Sleep Study - n/a CPAP -   Fasting Blood Sugar - 60-120 Checks Blood Sugar 2 times a day  Last dose of GLP1 agonist-  N/A GLP1 instructions:  N/A   Last dose of SGLT-2 inhibitors-  N/A SGLT-2 instructions: N/A   Blood Thinner Instructions: Aspirin Instructions: ASA 81, pt holding 7 days Last Dose:  Activity level: Can perform activities of daily living without stopping and without symptoms of chest pain or shortness of breath. PT w/c bound can transfer from chair to recliner. Denies any assistance at home.   Anesthesia review: new atrial bigeminy, HTN, DM2  Patient denies shortness of breath, fever, cough and chest pain at PAT appointment  Patient verbalized understanding of instructions that were given to them at the PAT appointment. Patient was also instructed that they will need to review over the PAT instructions again at home before surgery.

## 2022-04-22 ENCOUNTER — Encounter (HOSPITAL_COMMUNITY): Payer: Self-pay

## 2022-04-22 ENCOUNTER — Encounter (HOSPITAL_COMMUNITY)
Admission: RE | Admit: 2022-04-22 | Discharge: 2022-04-22 | Disposition: A | Discharge status: Home / Self Care | Payer: Medicaid Other | Source: Ambulatory Visit | Attending: Urology | Admitting: Urology

## 2022-04-22 VITALS — BP 132/64 | HR 77 | Resp 14 | Ht 69.0 in | Wt 250.0 lb

## 2022-04-22 DIAGNOSIS — Z01818 Encounter for other preprocedural examination: Secondary | ICD-10-CM | POA: Diagnosis present

## 2022-04-22 DIAGNOSIS — E1151 Type 2 diabetes mellitus with diabetic peripheral angiopathy without gangrene: Secondary | ICD-10-CM | POA: Insufficient documentation

## 2022-04-22 LAB — BASIC METABOLIC PANEL
Anion gap: 11 (ref 5–15)
BUN: 24 mg/dL — ABNORMAL HIGH (ref 8–23)
CO2: 26 mmol/L (ref 22–32)
Calcium: 8.9 mg/dL (ref 8.9–10.3)
Chloride: 106 mmol/L (ref 98–111)
Creatinine, Ser: 1.42 mg/dL — ABNORMAL HIGH (ref 0.61–1.24)
GFR, Estimated: 56 mL/min — ABNORMAL LOW (ref >60)
Glucose, Bld: 89 mg/dL (ref 70–99)
Potassium: 3.9 mmol/L (ref 3.5–5.1)
Sodium: 143 mmol/L (ref 135–145)

## 2022-04-22 LAB — CBC
HCT: 28.4 % — ABNORMAL LOW (ref 39.0–52.0)
Hemoglobin: 8.5 g/dL — ABNORMAL LOW (ref 13.0–17.0)
MCH: 23.9 pg — ABNORMAL LOW (ref 26.0–34.0)
MCHC: 29.9 g/dL — ABNORMAL LOW (ref 30.0–36.0)
MCV: 80 fL (ref 80.0–100.0)
Platelets: 288 10*3/uL (ref 150–400)
RBC: 3.55 MIL/uL — ABNORMAL LOW (ref 4.22–5.81)
RDW: 15.7 % — ABNORMAL HIGH (ref 11.5–15.5)
WBC: 10 10*3/uL (ref 4.0–10.5)
nRBC: 0 % (ref 0.0–0.2)

## 2022-04-22 LAB — HEMOGLOBIN A1C
Hgb A1c MFr Bld: 5.8 % — ABNORMAL HIGH (ref 4.8–5.6)
Mean Plasma Glucose: 119.76 mg/dL

## 2022-04-22 LAB — GLUCOSE, CAPILLARY: Glucose-Capillary: 85 mg/dL (ref 70–99)

## 2022-04-27 NOTE — Anesthesia Preprocedure Evaluation (Addendum)
Anesthesia Evaluation  Patient identified by MRN, date of birth, ID band Patient awake    Reviewed: Allergy & Precautions, NPO status , Patient's Chart, lab work & pertinent test results  History of Anesthesia Complications Negative for: history of anesthetic complications  Airway Mallampati: III  TM Distance: >3 FB Neck ROM: Full    Dental no notable dental hx.    Pulmonary neg shortness of breath, neg sleep apnea, neg COPD, neg recent URI, former smoker   Pulmonary exam normal breath sounds clear to auscultation       Cardiovascular hypertension (lisinopril), Pt. on medications (-) angina + Peripheral Vascular Disease (s/p left BKA)  (-) Past MI, (-) Cardiac Stents and (-) CABG + dysrhythmias (PACs, bigeminy)  Rhythm:Regular Rate:Normal     Neuro/Psych  Neuromuscular disease (diabetic polyneuropathy)    GI/Hepatic negative GI ROS, Neg liver ROS,,,  Endo/Other  diabetes (Hgb A1c 5.8), Well Controlled, Type 2, Insulin Dependent    Renal/GU Renal disease     Musculoskeletal   Abdominal  (+) + obese  Peds  Hematology  (+) Blood dyscrasia, anemia   Anesthesia Other Findings Osteomyelitis, LD  Reproductive/Obstetrics                             Anesthesia Physical Anesthesia Plan  ASA: 3  Anesthesia Plan: General   Post-op Pain Management:    Induction: Intravenous  PONV Risk Score and Plan: 2 and Dexamethasone and Ondansetron  Airway Management Planned: Oral ETT  Additional Equipment:   Intra-op Plan:   Post-operative Plan: Extubation in OR  Informed Consent: I have reviewed the patients History and Physical, chart, labs and discussed the procedure including the risks, benefits and alternatives for the proposed anesthesia with the patient or authorized representative who has indicated his/her understanding and acceptance.     Dental advisory given  Plan Discussed with: CRNA  and Anesthesiologist  Anesthesia Plan Comments: (Risks of general anesthesia discussed including, but not limited to, sore throat, hoarse voice, chipped/damaged teeth, injury to vocal cords, nausea and vomiting, allergic reactions, lung infection, heart attack, stroke, and death. All questions answered. )       Anesthesia Quick Evaluation

## 2022-04-28 ENCOUNTER — Other Ambulatory Visit: Payer: Self-pay

## 2022-04-28 ENCOUNTER — Ambulatory Visit (HOSPITAL_BASED_OUTPATIENT_CLINIC_OR_DEPARTMENT_OTHER): Payer: Medicaid Other | Admitting: Anesthesiology

## 2022-04-28 ENCOUNTER — Ambulatory Visit (HOSPITAL_COMMUNITY): Payer: Medicaid Other | Admitting: Physician Assistant

## 2022-04-28 ENCOUNTER — Ambulatory Visit (HOSPITAL_COMMUNITY)
Admission: RE | Admit: 2022-04-28 | Discharge: 2022-04-29 | Disposition: A | Discharge status: Home / Self Care | Payer: Medicaid Other | Source: Ambulatory Visit | Attending: Urology | Admitting: Urology

## 2022-04-28 ENCOUNTER — Encounter (HOSPITAL_COMMUNITY)
Admission: RE | Disposition: A | Discharge status: Home / Self Care | Payer: Self-pay | Source: Ambulatory Visit | Attending: Urology

## 2022-04-28 ENCOUNTER — Encounter (HOSPITAL_COMMUNITY): Payer: Self-pay | Admitting: Urology

## 2022-04-28 DIAGNOSIS — F17200 Nicotine dependence, unspecified, uncomplicated: Secondary | ICD-10-CM | POA: Insufficient documentation

## 2022-04-28 DIAGNOSIS — Z87891 Personal history of nicotine dependence: Secondary | ICD-10-CM

## 2022-04-28 DIAGNOSIS — I10 Essential (primary) hypertension: Secondary | ICD-10-CM

## 2022-04-28 DIAGNOSIS — N39498 Other specified urinary incontinence: Secondary | ICD-10-CM | POA: Diagnosis not present

## 2022-04-28 DIAGNOSIS — N401 Enlarged prostate with lower urinary tract symptoms: Secondary | ICD-10-CM | POA: Diagnosis not present

## 2022-04-28 DIAGNOSIS — Z89512 Acquired absence of left leg below knee: Secondary | ICD-10-CM | POA: Insufficient documentation

## 2022-04-28 DIAGNOSIS — R338 Other retention of urine: Secondary | ICD-10-CM | POA: Insufficient documentation

## 2022-04-28 DIAGNOSIS — N138 Other obstructive and reflux uropathy: Secondary | ICD-10-CM | POA: Diagnosis not present

## 2022-04-28 DIAGNOSIS — Z794 Long term (current) use of insulin: Secondary | ICD-10-CM | POA: Diagnosis not present

## 2022-04-28 DIAGNOSIS — E1151 Type 2 diabetes mellitus with diabetic peripheral angiopathy without gangrene: Secondary | ICD-10-CM

## 2022-04-28 DIAGNOSIS — N400 Enlarged prostate without lower urinary tract symptoms: Secondary | ICD-10-CM | POA: Diagnosis not present

## 2022-04-28 DIAGNOSIS — E119 Type 2 diabetes mellitus without complications: Secondary | ICD-10-CM | POA: Insufficient documentation

## 2022-04-28 DIAGNOSIS — Z7982 Long term (current) use of aspirin: Secondary | ICD-10-CM | POA: Insufficient documentation

## 2022-04-28 DIAGNOSIS — R3915 Urgency of urination: Secondary | ICD-10-CM | POA: Diagnosis not present

## 2022-04-28 DIAGNOSIS — N312 Flaccid neuropathic bladder, not elsewhere classified: Secondary | ICD-10-CM | POA: Diagnosis not present

## 2022-04-28 HISTORY — PX: TRANSURETHRAL RESECTION OF PROSTATE: SHX73

## 2022-04-28 LAB — BASIC METABOLIC PANEL
Anion gap: 8 (ref 5–15)
BUN: 21 mg/dL (ref 8–23)
CO2: 26 mmol/L (ref 22–32)
Calcium: 8.7 mg/dL — ABNORMAL LOW (ref 8.9–10.3)
Chloride: 106 mmol/L (ref 98–111)
Creatinine, Ser: 1.45 mg/dL — ABNORMAL HIGH (ref 0.61–1.24)
GFR, Estimated: 54 mL/min — ABNORMAL LOW (ref >60)
Glucose, Bld: 120 mg/dL — ABNORMAL HIGH (ref 70–99)
Potassium: 4.4 mmol/L (ref 3.5–5.1)
Sodium: 140 mmol/L (ref 135–145)

## 2022-04-28 LAB — TYPE AND SCREEN
ABO/RH(D): AB POS
Antibody Screen: NEGATIVE

## 2022-04-28 LAB — CBC
HCT: 31.7 % — ABNORMAL LOW (ref 39.0–52.0)
HCT: 32.4 % — ABNORMAL LOW (ref 39.0–52.0)
Hemoglobin: 9.4 g/dL — ABNORMAL LOW (ref 13.0–17.0)
Hemoglobin: 9.5 g/dL — ABNORMAL LOW (ref 13.0–17.0)
MCH: 23.6 pg — ABNORMAL LOW (ref 26.0–34.0)
MCH: 23.7 pg — ABNORMAL LOW (ref 26.0–34.0)
MCHC: 29.7 g/dL — ABNORMAL LOW (ref 30.0–36.0)
MCHC: 29.3 g/dL — ABNORMAL LOW (ref 30.0–36.0)
MCV: 79.6 fL — ABNORMAL LOW (ref 80.0–100.0)
MCV: 80.8 fL (ref 80.0–100.0)
Platelets: 312 10*3/uL (ref 150–400)
Platelets: 327 10*3/uL (ref 150–400)
RBC: 3.98 MIL/uL — ABNORMAL LOW (ref 4.22–5.81)
RBC: 4.01 MIL/uL — ABNORMAL LOW (ref 4.22–5.81)
RDW: 15.6 % — ABNORMAL HIGH (ref 11.5–15.5)
RDW: 15.6 % — ABNORMAL HIGH (ref 11.5–15.5)
WBC: 10.6 10*3/uL — ABNORMAL HIGH (ref 4.0–10.5)
WBC: 11.1 10*3/uL — ABNORMAL HIGH (ref 4.0–10.5)
nRBC: 0 % (ref 0.0–0.2)
nRBC: 0 % (ref 0.0–0.2)

## 2022-04-28 LAB — GLUCOSE, CAPILLARY
Glucose-Capillary: 106 mg/dL — ABNORMAL HIGH (ref 70–99)
Glucose-Capillary: 111 mg/dL — ABNORMAL HIGH (ref 70–99)
Glucose-Capillary: 190 mg/dL — ABNORMAL HIGH (ref 70–99)

## 2022-04-28 SURGERY — TURP (TRANSURETHRAL RESECTION OF PROSTATE)
Anesthesia: General

## 2022-04-28 MED ORDER — INSULIN DETEMIR 100 UNIT/ML ~~LOC~~ SOLN
20.0000 [IU] | Freq: Every day | SUBCUTANEOUS | Status: DC
  Administered 2022-04-28: 20 [IU] via SUBCUTANEOUS
  Filled 2022-04-28: qty 0.2

## 2022-04-28 MED ORDER — INSULIN ASPART 100 UNIT/ML IJ SOLN
0.0000 [IU] | INTRAMUSCULAR | Status: DC
  Administered 2022-04-28: 3 [IU] via SUBCUTANEOUS
  Administered 2022-04-29: 2 [IU] via SUBCUTANEOUS

## 2022-04-28 MED ORDER — MIDAZOLAM HCL 2 MG/2ML IJ SOLN
INTRAMUSCULAR | Status: AC
  Filled 2022-04-28: qty 2

## 2022-04-28 MED ORDER — STERILE WATER FOR IRRIGATION IR SOLN
Status: DC | PRN
  Administered 2022-04-28: 500 mL

## 2022-04-28 MED ORDER — TAMSULOSIN HCL 0.4 MG PO CAPS
0.4000 mg | ORAL_CAPSULE | Freq: Every day | ORAL | Status: DC
  Filled 2022-04-28: qty 1

## 2022-04-28 MED ORDER — FENTANYL CITRATE (PF) 100 MCG/2ML IJ SOLN
INTRAMUSCULAR | Status: AC
  Filled 2022-04-28: qty 2

## 2022-04-28 MED ORDER — POLYVINYL ALCOHOL 1.4 % OP SOLN: 1.0000 [drp] | Freq: Every day | OPHTHALMIC | Status: DC | PRN

## 2022-04-28 MED ORDER — OXYCODONE HCL 5 MG PO TABS: 5.0000 mg | ORAL_TABLET | Freq: Once | ORAL | Status: DC | PRN

## 2022-04-28 MED ORDER — SIMVASTATIN 10 MG PO TABS
10.0000 mg | ORAL_TABLET | Freq: Every day | ORAL | Status: DC
  Administered 2022-04-28: 10 mg via ORAL
  Filled 2022-04-28: qty 1

## 2022-04-28 MED ORDER — OXYCODONE HCL 5 MG PO TABS: 5.0000 mg | ORAL_TABLET | ORAL | 0 refills | Status: DC | PRN

## 2022-04-28 MED ORDER — PROMETHAZINE HCL 25 MG/ML IJ SOLN: 6.2500 mg | INTRAMUSCULAR | Status: DC | PRN

## 2022-04-28 MED ORDER — PHENYLEPHRINE HCL-NACL 20-0.9 MG/250ML-% IV SOLN
INTRAVENOUS | Status: DC | PRN
  Administered 2022-04-28: 25 ug/min via INTRAVENOUS

## 2022-04-28 MED ORDER — ACETAMINOPHEN 325 MG PO TABS: 650.0000 mg | ORAL_TABLET | ORAL | Status: DC | PRN

## 2022-04-28 MED ORDER — SODIUM CHLORIDE 0.9% FLUSH: 3.0000 mL | INTRAVENOUS | Status: DC | PRN

## 2022-04-28 MED ORDER — BISACODYL 10 MG RE SUPP: 10.0000 mg | Freq: Every day | RECTAL | Status: DC | PRN

## 2022-04-28 MED ORDER — POLYETHYL GLYCOL-PROPYL GLYCOL 0.4-0.3 % OP SOLN: 1.0000 "application " | Freq: Every day | OPHTHALMIC | Status: DC | PRN

## 2022-04-28 MED ORDER — OXYCODONE-ACETAMINOPHEN 5-325 MG PO TABS: 1.0000 | ORAL_TABLET | ORAL | 0 refills | Status: DC | PRN

## 2022-04-28 MED ORDER — SODIUM CHLORIDE 0.9 % IR SOLN
3000.0000 mL | Status: DC
  Administered 2022-04-28 – 2022-04-29 (×4): 3000 mL

## 2022-04-28 MED ORDER — SODIUM CHLORIDE 0.9 % IV SOLN: INTRAVENOUS | Status: DC

## 2022-04-28 MED ORDER — OXYCODONE-ACETAMINOPHEN 5-325 MG PO TABS: 1.0000 | ORAL_TABLET | ORAL | Status: DC | PRN

## 2022-04-28 MED ORDER — OXYCODONE HCL 5 MG/5ML PO SOLN: 5.0000 mg | Freq: Once | ORAL | Status: DC | PRN

## 2022-04-28 MED ORDER — MIDAZOLAM HCL 2 MG/2ML IJ SOLN
INTRAMUSCULAR | Status: DC | PRN
  Administered 2022-04-28 (×2): 1 mg via INTRAVENOUS

## 2022-04-28 MED ORDER — ACETAMINOPHEN 500 MG PO TABS
1000.0000 mg | ORAL_TABLET | Freq: Once | ORAL | Status: AC
  Administered 2022-04-28: 1000 mg via ORAL
  Filled 2022-04-28: qty 2

## 2022-04-28 MED ORDER — CEFAZOLIN SODIUM-DEXTROSE 2-4 GM/100ML-% IV SOLN
2.0000 g | INTRAVENOUS | Status: AC
  Administered 2022-04-28: 2 g via INTRAVENOUS
  Filled 2022-04-28: qty 100

## 2022-04-28 MED ORDER — FENTANYL CITRATE PF 50 MCG/ML IJ SOSY: 25.0000 ug | PREFILLED_SYRINGE | INTRAMUSCULAR | Status: DC | PRN

## 2022-04-28 MED ORDER — ONDANSETRON HCL 4 MG/2ML IJ SOLN: 4.0000 mg | INTRAMUSCULAR | Status: DC | PRN

## 2022-04-28 MED ORDER — ONDANSETRON HCL 4 MG/2ML IJ SOLN
INTRAMUSCULAR | Status: AC
  Filled 2022-04-28: qty 4

## 2022-04-28 MED ORDER — SODIUM CHLORIDE 0.9% FLUSH: 3.0000 mL | Freq: Two times a day (BID) | INTRAVENOUS | Status: DC

## 2022-04-28 MED ORDER — ZOLPIDEM TARTRATE 5 MG PO TABS: 5.0000 mg | ORAL_TABLET | Freq: Every evening | ORAL | Status: DC | PRN

## 2022-04-28 MED ORDER — LACTATED RINGERS IV SOLN: INTRAVENOUS | Status: DC

## 2022-04-28 MED ORDER — ONDANSETRON HCL 4 MG/2ML IJ SOLN
INTRAMUSCULAR | Status: DC | PRN
  Administered 2022-04-28: 4 mg via INTRAVENOUS

## 2022-04-28 MED ORDER — HYDROMORPHONE HCL 1 MG/ML IJ SOLN: 0.5000 mg | INTRAMUSCULAR | Status: DC | PRN

## 2022-04-28 MED ORDER — LIDOCAINE 2% (20 MG/ML) 5 ML SYRINGE
INTRAMUSCULAR | Status: DC | PRN
  Administered 2022-04-28: 100 mg via INTRAVENOUS

## 2022-04-28 MED ORDER — SENNOSIDES-DOCUSATE SODIUM 8.6-50 MG PO TABS: 1.0000 | ORAL_TABLET | Freq: Every evening | ORAL | Status: DC | PRN

## 2022-04-28 MED ORDER — ORAL CARE MOUTH RINSE: 15.0000 mL | Freq: Once | OROMUCOSAL | Status: AC

## 2022-04-28 MED ORDER — SODIUM CHLORIDE 0.9 % IV SOLN: 250.0000 mL | INTRAVENOUS | Status: DC | PRN

## 2022-04-28 MED ORDER — HEPARIN SODIUM (PORCINE) 5000 UNIT/ML IJ SOLN
5000.0000 [IU] | Freq: Three times a day (TID) | INTRAMUSCULAR | Status: DC
  Administered 2022-04-29: 5000 [IU] via SUBCUTANEOUS
  Filled 2022-04-28: qty 1

## 2022-04-28 MED ORDER — SODIUM CHLORIDE 0.9 % IR SOLN
Status: DC | PRN
  Administered 2022-04-28: 36000 mL via INTRAVESICAL

## 2022-04-28 MED ORDER — FENTANYL CITRATE (PF) 250 MCG/5ML IJ SOLN
INTRAMUSCULAR | Status: DC | PRN
  Administered 2022-04-28 (×2): 50 ug via INTRAVENOUS

## 2022-04-28 MED ORDER — OXYBUTYNIN CHLORIDE ER 5 MG PO TB24
10.0000 mg | ORAL_TABLET | Freq: Every day | ORAL | Status: DC
  Administered 2022-04-28: 10 mg via ORAL
  Filled 2022-04-28 (×2): qty 2

## 2022-04-28 MED ORDER — TRIPLE ANTIBIOTIC 3.5-400-5000 EX OINT: 1.0000 | TOPICAL_OINTMENT | Freq: Three times a day (TID) | CUTANEOUS | Status: DC | PRN

## 2022-04-28 MED ORDER — DEXAMETHASONE SODIUM PHOSPHATE 10 MG/ML IJ SOLN
INTRAMUSCULAR | Status: DC | PRN
  Administered 2022-04-28: 5 mg via INTRAVENOUS

## 2022-04-28 MED ORDER — LISINOPRIL 5 MG PO TABS
5.0000 mg | ORAL_TABLET | Freq: Every day | ORAL | Status: DC
  Filled 2022-04-28: qty 1

## 2022-04-28 MED ORDER — PROPOFOL 10 MG/ML IV BOLUS
INTRAVENOUS | Status: DC | PRN
  Administered 2022-04-28: 200 mg via INTRAVENOUS

## 2022-04-28 MED ORDER — CHLORHEXIDINE GLUCONATE 0.12 % MT SOLN
15.0000 mL | Freq: Once | OROMUCOSAL | Status: AC
  Administered 2022-04-28: 15 mL via OROMUCOSAL

## 2022-04-28 MED ORDER — DEXAMETHASONE SODIUM PHOSPHATE 10 MG/ML IJ SOLN
INTRAMUSCULAR | Status: AC
  Filled 2022-04-28: qty 2

## 2022-04-28 MED ORDER — FENTANYL CITRATE (PF) 250 MCG/5ML IJ SOLN
INTRAMUSCULAR | Status: AC
  Filled 2022-04-28: qty 5

## 2022-04-28 SURGICAL SUPPLY — 22 items
BAG URINE DRAIN 2000ML AR STRL (UROLOGICAL SUPPLIES) ×1 IMPLANT
BAG URO CATCHER STRL LF (MISCELLANEOUS) ×1 IMPLANT
CATH FOLEY 3WAY 30CC 22FR (CATHETERS) IMPLANT
CATH FOLEY 3WAY 30CC 24FR (CATHETERS)
CATH URTH STD 24FR FL 3W 2 (CATHETERS) IMPLANT
DRAPE FOOT SWITCH (DRAPES) ×1 IMPLANT
ELECT REM PT RETURN 15FT ADLT (MISCELLANEOUS) IMPLANT
GLOVE SURG LX STRL 7.5 STRW (GLOVE) ×1 IMPLANT
GOWN STRL REUS W/ TWL XL LVL3 (GOWN DISPOSABLE) ×1 IMPLANT
GOWN STRL REUS W/TWL XL LVL3 (GOWN DISPOSABLE) ×1
GUIDEWIRE STR DUAL SENSOR (WIRE) IMPLANT
HOLDER FOLEY CATH W/STRAP (MISCELLANEOUS) IMPLANT
IV CATH 14GX2 1/4 (CATHETERS) IMPLANT
KIT TURNOVER KIT A (KITS) IMPLANT
LOOP CUT BIPOLAR 24F LRG (ELECTROSURGICAL) IMPLANT
MANIFOLD NEPTUNE II (INSTRUMENTS) ×1 IMPLANT
PACK CYSTO (CUSTOM PROCEDURE TRAY) ×1 IMPLANT
SYR 30ML LL (SYRINGE) ×1 IMPLANT
SYR TOOMEY IRRIG 70ML (MISCELLANEOUS) ×1
SYRINGE TOOMEY IRRIG 70ML (MISCELLANEOUS) ×1 IMPLANT
TUBING CONNECTING 10 (TUBING) ×1 IMPLANT
TUBING UROLOGY SET (TUBING) ×1 IMPLANT

## 2022-04-28 NOTE — Progress Notes (Signed)
Patient here for surgery. Patient notified staff he has $ and meds. $ provided to security and key in chart for locker # 16. Meds counted in front of patient and taken to pharmacy.

## 2022-04-28 NOTE — Progress Notes (Addendum)
Patient stated that he is planning to stay over night tonight but is planning to take a cab home after he is discharged.  Dr. Cardell Peach paged to make sure the plan is for the patient to stay overnight tonight before surgery this afternoon    11:57 AM Per Dr Cardell Peach.  Plan is for the patient to stay overnight after surgery.    Evern Bio BSN, Radio producer - Perioperative Services [place] [phone]

## 2022-04-28 NOTE — Anesthesia Postprocedure Evaluation (Signed)
Anesthesia Post Note  Patient: [name]  Procedure(s) Performed: BIPOLAR TRANSURETHRAL RESECTION OF THE PROSTATE (TURP)     Patient location during evaluation: PACU Anesthesia Type: General Level of consciousness: awake Pain management: pain level controlled Vital Signs Assessment: post-procedure vital signs reviewed and stable Respiratory status: spontaneous breathing, nonlabored ventilation and respiratory function stable Cardiovascular status: blood pressure returned to baseline and stable Postop Assessment: no apparent nausea or vomiting Anesthetic complications: no   No notable events documented.  Last Vitals:  Vitals:   04/28/22 1155 04/28/22 1543  BP: (!) 165/84 (!) 151/69  Pulse: 71 85  Resp: 16 18  Temp: 36.9 C 36.9 C  SpO2: 100% 99%    Last Pain:  Vitals:   04/28/22 1155  TempSrc: Oral  PainSc: 0-No pain                 Linton Rump

## 2022-04-28 NOTE — Op Note (Signed)
Operative Note  Preoperative diagnosis:  1.  BPH with bladder outlet obstruction  Postoperative diagnosis: 1.  BPH with bladder outlet obstruction  Procedure(s): 1.  Bipolar transurethral resection of prostate  Surgeon: Jettie Pagan, MD  Assistants:  None  Anesthesia:  General  Complications:  None  EBL:  72ml  Specimens: 1. Prostate chips ID Type Source Tests Collected by Time Destination  1 : prostatic chips Tissue PATH Prostate TURP SURGICAL PATHOLOGY Jannifer Hick, MD 04/28/2022 1439    Drains/Catheters: 1.  22Fr 3 way catheter with 23ml water into balloon  Intraoperative findings:   Trilobar obstructing prostate with very large intravesical component. Excellent open prostatic fossa and excellent hemostasis.  Indication:  [name] is a 61 y.o. male with a history of a hypotonic bladder however with prostate size of 100cc who undergoes CI who is presenting for transurethral resection of the prostate. He had a TRUS volume of 118. UDS demonstrated involuntary void with urgency. Preop cystoscopy demonstrated trilobar obstructing prostate. After thorough discussion including all relevant risk benefits and alternatives, he presents today for a bipolar TURP in a chance to be catheter free.  Description of procedure: The indication, alternatives, benefits and risks were discussed with the patient and informed consent was obtained.  Patient was brought to the operating room table, positioned supine, secured with a safety strap.  Pneumatic compression devices were placed on the lower extremities.  After the administration of intravenous antibiotics and general anesthesia, the patient was repositioned into the dorsal lithotomy position.  All pressure points were carefully padded.  A rectal examination was performed confirming a smooth symmetric enlarged gland.  The genitalia were prepped and draped in standard sterile manner.  A timeout was completed, verifying the correct patient,  surgical procedure and positioning prior to beginning the procedure.  Isotonic sodium chloride was used for irrigation.  A 26 French continuous-flow resectoscope sheath with the visual obturator and a 30 degree lens was advanced under direct vision into the bladder.  The anterior urethra appeared normal in its entirety. The prostatic urethra was elongated with trilobar hyperplasia.  On cystoscopic evaluation, his bladder capacity appeared normal, the bladder wall was noted to expand symmetrically in all dimensions.  There were no tumors, stones or foreign bodies present. The bladder was trabeculated with normal-appearing mucosa.  Both ureteral orifices were in their normal anatomic positions with clear urinary reflux noted bilaterally.  The obturator was removed and replaced by the working element with a resection loop.  The location of the ureteral orifices and the prostatic configuration were again confirmed.  Starting at the bladder neck and proceeding distally to the verumontanum a transurethral section of the prostate was performed using bipolar using energy of 4 and 5 for cutting and coagulation, respectively.  The procedure began at the bladder neck at the 5 o'clock and 7 o'clock positions and carefully carried distally to the verumontanum, resecting the intervening prostatic adenoma. This was a large intravesical component that was completely resected. Next the left lateral lobe was resected to the level of the transverse capsular fibers.  The identical procedure was performed on the right lobe.  Attention was then directed anteriorly and the resection was completed from the 10 o'clock to 2 o'clock positions.  All bleeding vessels were fulgurated achieving meticulous hemostasis.  The bladder was irrigated with a Toomey syringe, ensuring removal of all prostate chips which were sent to pathology for evaluation.  Having completed the resection and the chips removed, we again confirmed hemostasis  with  the loop with coagulating current.  Upon completion of the entire procedure, the bladder and posterior urethra were reexamined, confirming open prostatic urethra and bladder neck without evidence of bleeding or perforation.  Both ureteral orifices and the external sphincter were noted to be intact.  The resectoscope was withdrawn under direct vision and a 22 French three-way Foley catheter with a 30 cc balloon was inserted into the bladder.  The balloon was inflated with 30 cc of sterile water and the catheter was placed on gentle traction.  After multiple manual irrigations ensuring clear return of the irrigant, the procedure was terminated.  The catheter was attached to a drainage bag and continuous bladder irrigation was started with normal saline.  The patient was positioned supine.  At the end of the procedure, all counts were correct.  Patient tolerated the procedure well and was taken to the recovery room satisfactory condition.  Plan: Continuous bladder irrigation overnight with gentle Foley traction.  Plan to discharge home tomorrow with Foley catheter in place and void trial in the office in 3 days. He understands that he may have to continue CIC depending on bladder function.  Matt R. Maudy Yonan MD [facility]  Pager: [phone]

## 2022-04-28 NOTE — Transfer of Care (Signed)
Immediate Anesthesia Transfer of Care Note  Patient: [name]  Procedure(s) Performed: BIPOLAR TRANSURETHRAL RESECTION OF THE PROSTATE (TURP)  Patient Location: PACU  Anesthesia Type:General  Level of Consciousness: sedated  Airway & Oxygen Therapy: Patient Spontanous Breathing and Patient connected to face mask oxygen  Post-op Assessment: Report given to RN and Post -op Vital signs reviewed and stable  Post vital signs: Reviewed and stable  Last Vitals:  Vitals Value Taken Time  BP    Temp    Pulse    Resp    SpO2      Last Pain:  Vitals:   04/28/22 1155  TempSrc: Oral  PainSc: 0-No pain         Complications: No notable events documented.

## 2022-04-28 NOTE — Anesthesia Procedure Notes (Signed)
Procedure Name: LMA Insertion Date/Time: 04/28/2022 2:08 PM  Performed by: Minerva Ends, CRNAPre-anesthesia Checklist: Patient identified, Emergency Drugs available, Suction available and Patient being monitored Patient Re-evaluated:Patient Re-evaluated prior to induction Oxygen Delivery Method: Circle System Utilized Preoxygenation: Pre-oxygenation with 100% oxygen Induction Type: IV induction Ventilation: Mask ventilation without difficulty LMA: LMA inserted and LMA with gastric port inserted LMA Size: 4.0 Number of attempts: 1 Placement Confirmation: positive ETCO2 Tube secured with: Tape Dental Injury: Teeth and Oropharynx as per pre-operative assessment  Comments: Chipping present left front tooth unchanged with LMA placement-- bilat BS Allen-- IV induction Freida Busman-- LMA AM CRNA

## 2022-04-28 NOTE — Anesthesia Procedure Notes (Signed)
Date/Time: 04/28/2022 3:34 PM  Performed by: Minerva Ends, CRNAOxygen Delivery Method: Simple face mask Placement Confirmation: positive ETCO2 and breath sounds checked- equal and bilateral Dental Injury: Teeth and Oropharynx as per pre-operative assessment

## 2022-04-28 NOTE — H&P (Signed)
Office Visit Report     04/08/2022   --------------------------------------------------------------------------------   Cypher Paule  MRN: [id number]  DOB: 1960/09/04, 61 year old Male  SSN:    PRIMARY CARE:    REFERRING:    PROVIDER:  Alen Blew. Lafonda Mosses, M.D.  TREATING:  Jettie Pagan, M.D.  LOCATION:  [facility] 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   #1. Urinary retention/BPH/hypotonic bladder:  -He was initially evaluated in our office in 12/2021 with complaints of worsening lower urine tract symptoms including overflow incontinence. PVR in 12/2021 was greater than 1 L and Foley catheter was placed. He then transition to CIC.  -Urodynamics 02/06/2022 with involuntary void with urgency and urge urinary incontinence. He was unable to generate a voluntary contraction to void. He tried to strain however was unable to void.  -TRUS ultrasound 04/08/2022 118 prostate with very large intravesical component.  -Cystoscopy with trilobar obstructing prostate with very large median lobe and intravesical component. He has a chronically distended bladder with severe trabeculation.  -He states he has been catheterizing about 6 times a day.   He does have a past medical history of poorly controlled diabetes. He takes aspirin. He denies cardiac or pulmonary history.     ALLERGIES: No Allergies    MEDICATIONS: Aspirin 81 mg tablet,chewable  Lisinopril 5 mg tablet  Simvastatin 10 mg tablet  Tamsulosin Hcl 0.4 mg capsule 1 capsule PO Daily  Colace  Lantus  Vitamin D3     GU PSH: Complex cystometrogram, w/ void pressure and urethral pressure profile studies, any technique - 02/06/2022 Complex Uroflow - 02/06/2022 Emg surf Electrd - 02/06/2022 Intrabd voidng Press - 02/06/2022        PSH Notes: Leg/Toe Surgery    NON-GU PSH: No Non-GU PSH    GU PMH: Urinary Retention - 02/13/2022, - 02/06/2022, - 01/16/2022, - 12/30/2021, - 12/16/2021    NON-GU PMH: Diabetes Type 2    FAMILY HISTORY: 1 Daughter - Runs in Family   SOCIAL HISTORY: Marital Status: Unknown Ethnicity: Not Hispanic Or Latino; Race: Black or African American Current Smoking Status: Patient smokes. Has smoked since 12/14/2016.   Tobacco Use Assessment Completed: Used Tobacco in last 30 days? Has never drank.  Does not drink caffeine.    REVIEW OF SYSTEMS:    GU Review Male:   Patient denies frequent urination, hard to postpone urination, burning/ pain with urination, get up at night to urinate, leakage of urine, stream starts and stops, trouble starting your stream, have to strain to urinate , erection problems, and penile pain.  Gastrointestinal (Upper):   Patient denies nausea, vomiting, and indigestion/ heartburn.  Gastrointestinal (Lower):   Patient denies diarrhea and constipation.  Constitutional:   Patient denies fever, night sweats, weight loss, and fatigue.  Skin:   Patient denies skin rash/ lesion and itching.  Eyes:   Patient denies blurred vision and double vision.  Ears/ Nose/ Throat:   Patient denies sore throat and sinus problems.  Hematologic/Lymphatic:   Patient denies swollen glands and easy bruising.  Cardiovascular:   Patient denies leg swelling and chest pains.  Respiratory:   Patient denies cough and shortness of breath.  Endocrine:   Patient denies excessive thirst.  Musculoskeletal:   Patient denies back pain and joint pain.  Neurological:   Patient denies headaches and dizziness.  Psychologic:  Patient denies depression and anxiety.   VITAL SIGNS: None   MULTI-SYSTEM PHYSICAL EXAMINATION:    Constitutional: Well-nourished. No physical deformities. Normally developed. Good grooming.  Respiratory: No labored breathing, no use of accessory muscles.   Cardiovascular: Normal  temperature, normal extremity pulses, no swelling, no varicosities.  Gastrointestinal: No mass, no tenderness, no rigidity, non obese abdomen.     Complexity of Data:  Source Of History:  Patient, Medical Record Summary  Lab Test Review:   PSA  Records Review:   AUA Symptom Score  Urine Test Review:   Urinalysis  Urodynamics Review:   Review Bladder Scan, Review Urodynamics Tests  X-Ray Review: Prostate Ultrasound: Reviewed Films. Reviewed Report. Discussed With Patient.     PROCEDURES:         Prostate Ultrasound - [id number]  Length: 6.8 cm Height: 6.1 cm Width: 5.5 cm Volume: 118.25 ml      The transrectal ultrasound probe is introduced into the rectum, and the prostate is visualized. Ultrasonography is utilized throughout the procedure. At the conclusion of the procedure, the ultrasound probe is removed. The patient tolerates the procedure without complication.  Patient confirmed No Neulasta OnPro Device.     ASSESSMENT:      ICD-10 Details  1 GU:   Urinary Retention - R33.8   2   BPH w/LUTS - N40.1    PLAN:           Document Letter(s):  Created for Patient: Clinical Summary         Notes:   #1. Urinary retention/BPH/hypotonic bladder:  I discussed in detail today findings of urodynamic studies, TRUS ultrasound and cystoscopy findings. We discussed that he does have a very large prostate over 100 cc. He also has poorly controlled diabetes with a chronically appearing distended bladder as well as inability to void on urodynamics. I do think that he does have a component of hypertonicity of his bladder given his history of diabetes. Currently, he is catheter dependent requiring CIC at least 6 times a day.  -We discussed options of TURP for chance to be catheter free. We discussed that there personal studies demonstrating that this can be successful however this still may be unsuccessful meaning that he would have to catheterize himself after the procedure.  -After discussing his  options, he would like to schedule TURP. I discussed risk and benefits including risk of bleeding, infection, persistent obstructive symptoms, irritative symptoms, retrograde ejaculation. Surgery letter sent. Will obtain medical clearance.   Risks and benefits of Transurethral Resection of the Prostate were reviewed in detail including infection, bleeding, blood transfusion, injury to bladder/urethra/surrounding structures, erectile dysfunction, urinary incontinence, bladder neck contracture, persistent obstructive and irritative voiding symptoms, and global anesthesia risks including but not limited to CVA, MI, DVT, PE, pneumonia, and death. He expressed understanding and desire to proceed.   2. Prostate cancer screening: DRE 100 grams, no nodules. PSA 2.8 in 02/2022.   CC Irine SealLuke Machen   Urology Preoperative H&P   Chief Complaint: Urinary retention, hypotonic bladder with large prostate  History of Present Illness: Aldona BarSamuel Kristensen is a 61 y.o. male with Urinary retention, hypotonic bladder with large prostate here for TURP. Denies fevers, chills.     Past Medical History:  Diagnosis Date   Blood transfusion 2009   Diabetes mellitus    type 2   Hyperlipemia    Hypertension    denies   Leg swelling    Osteomyelitis (HCC)    right 5th toe  Visual disturbance     Past Surgical History:  Procedure Laterality Date   AMPUTATION Left 2012   Left Great Toe   AMPUTATION Right 04/20/2015   Procedure: Right Great Toe Amputation;  Surgeon: [name], MD;  Location[facility];  Service: Orthopedics;  Laterality: Right;   AMPUTATION Right 07/27/2015   Procedure: Right 2nd Toe Amputation;  Surgeon: [name], MD;  Location: [facility] OR;  Service: Orthopedics;  Laterality: Right;   AMPUTATION Right 09/24/2016   Procedure: Right 5th Toe Amputation;  Surgeon: [name], MD;  Location: [facility] OR;  Service: Orthopedics;  Laterality: Right;   AMPUTATION Right 01/07/2017   Procedure: Right Foot 3rd and 4th  Toe Amputation;  Surgeon: [name], MD;  Location: [facility] OR;  Service: Orthopedics;  Laterality: Right;   AMPUTATION Left 11/30/2020   Procedure: LEFT BELOW KNEE AMPUTATION;  Surgeon: [name], MD;  Location: [facility] OR;  Service: Orthopedics;  Laterality: Left;   CHOLECYSTECTOMY  03/2011   EYE SURGERY     lazer   foot surgery  11/2007   small toe on left foot removed.   NASAL SEPTUM SURGERY  1981   Due to broken nose. Surgery was to repair how bone healed.   PARS PLANA VITRECTOMY  11/17/2011   Procedure: PARS PLANA VITRECTOMY WITH 23 GAUGE;  Surgeon: Shade Flood, MD;  Location: [facility] OR;  Service: Ophthalmology;  Laterality: Right;  With Endolaser   TOE AMPUTATION Left    5 th   TONSILLECTOMY      Allergies: No Known Allergies  Family History  Problem Relation Age of Onset   Cancer Mother        breast    Social History:  reports that he quit smoking about 43 years ago. His smoking use included cigarettes. He has never used smokeless tobacco. He reports current alcohol use. He reports that he does not currently use drugs.  ROS: A complete review of systems was performed.  All systems are negative except for pertinent findings as noted.  Physical Exam:  Vital signs in last 24 hours: Temp:  [98.4 F (36.9 C)] 98.4 F (36.9 C) (11/13 1155) Pulse Rate:  [71] 71 (11/13 1155) Resp:  [16] 16 (11/13 1155) BP: (165)/(84) 165/84 (11/13 1155) SpO2:  [100 %] 100 % (11/13 1155) Weight:  [568 kg] 113 kg (11/13 1155) Constitutional:  Alert and oriented, No acute distress Cardiovascular: Regular rate and rhythm Respiratory: Normal respiratory effort, Lungs clear bilaterally GI: Abdomen is soft, nontender, nondistended, no abdominal masses GU: No CVA tenderness Lymphatic: No lymphadenopathy Neurologic: Grossly intact, no focal deficits Psychiatric: Normal mood and affect  Laboratory Data:  No results for input(s): "WBC", "HGB", "HCT", "PLT" in the last 72 hours.  No results for  input(s): "NA", "K", "CL", "GLUCOSE", "BUN", "CALCIUM", "CREATININE" in the last 72 hours.  Invalid input(s): "CO3"   Results for orders placed or performed during the hospital encounter of 04/28/22 (from the past 24 hour(s))  Glucose, capillary     Status: Abnormal   Collection Time: 04/28/22 12:34 PM  Result Value Ref Range   Glucose-Capillary 106 (H) 70 - 99 mg/dL   Comment 1 Notify RN    No results found for this or any previous visit (from the past 240 hour(s)).  Renal Function: Recent Labs    04/22/22 1350  CREATININE 1.42*   Estimated Creatinine Clearance: 66.8 mL/min (A) (by C-G formula based on SCr of 1.42 mg/dL (H)).  Radiologic Imaging:  No results found.  I independently reviewed the above imaging studies.  Assessment and Plan Kell Ferris is a 61 y.o. male with urinary retention, hypotonic bladder with large prostate here for TURP    Risks and benefits of Transurethral Resection of the Prostate were reviewed in detail including infection, bleeding, blood transfusion, injury to bladder/urethra/surrounding structures, erectile dysfunction, urinary incontinence, bladder neck contracture, persistent obstructive and irritative voiding symptoms, and global anesthesia risks including but not limited to CVA, MI, DVT, PE, pneumonia, and death. He expressed understanding and desire to proceed.     Matt R. Fiora Weill MD 04/28/2022, 1:29 PM  [facility] Pager: [phone]): [phone]

## 2022-04-29 ENCOUNTER — Encounter (HOSPITAL_COMMUNITY): Payer: Self-pay | Admitting: Urology

## 2022-04-29 DIAGNOSIS — N401 Enlarged prostate with lower urinary tract symptoms: Secondary | ICD-10-CM | POA: Diagnosis not present

## 2022-04-29 LAB — BASIC METABOLIC PANEL
Anion gap: 6 (ref 5–15)
BUN: 21 mg/dL (ref 8–23)
CO2: 28 mmol/L (ref 22–32)
Calcium: 8.4 mg/dL — ABNORMAL LOW (ref 8.9–10.3)
Chloride: 106 mmol/L (ref 98–111)
Creatinine, Ser: 1.4 mg/dL — ABNORMAL HIGH (ref 0.61–1.24)
GFR, Estimated: 57 mL/min — ABNORMAL LOW (ref >60)
Glucose, Bld: 119 mg/dL — ABNORMAL HIGH (ref 70–99)
Potassium: 4.2 mmol/L (ref 3.5–5.1)
Sodium: 140 mmol/L (ref 135–145)

## 2022-04-29 LAB — GLUCOSE, CAPILLARY
Glucose-Capillary: 102 mg/dL — ABNORMAL HIGH (ref 70–99)
Glucose-Capillary: 116 mg/dL — ABNORMAL HIGH (ref 70–99)
Glucose-Capillary: 130 mg/dL — ABNORMAL HIGH (ref 70–99)

## 2022-04-29 LAB — CBC
HCT: 28.7 % — ABNORMAL LOW (ref 39.0–52.0)
Hemoglobin: 8.6 g/dL — ABNORMAL LOW (ref 13.0–17.0)
MCH: 23.7 pg — ABNORMAL LOW (ref 26.0–34.0)
MCHC: 30 g/dL (ref 30.0–36.0)
MCV: 79.1 fL — ABNORMAL LOW (ref 80.0–100.0)
Platelets: 309 10*3/uL (ref 150–400)
RBC: 3.63 MIL/uL — ABNORMAL LOW (ref 4.22–5.81)
RDW: 15.5 % (ref 11.5–15.5)
WBC: 12.2 10*3/uL — ABNORMAL HIGH (ref 4.0–10.5)
nRBC: 0 % (ref 0.0–0.2)

## 2022-04-29 NOTE — Progress Notes (Signed)
Pt given discharge instructions, along with teaching on foley/ leg bag care and exchange. Pt medications returned from pharmacy and $353 returned from locker at security.  Receipts in chart.  Pt belongings returned, including wheelchair, rolling cart, clothing, cell phone, shoes.  Pt stated did not need help waiting for a cab at front entrance.  Taken to front entrance by tech where pt called a cab.

## 2022-04-29 NOTE — Progress Notes (Signed)
  Transition of Care [facility]) Screening Note   Patient Details  Name: [name] Date of Birth: 12/05/1960   Transition of Care [facility]) CM/SW Contact:    Lanier Clam, RN Phone Number: 04/29/2022, 9:45 AM    Transition of Care Department [facility]) has reviewed patient and no TOC needs have been identified at this time. We will continue to monitor patient advancement through interdisciplinary progression rounds. If new patient transition needs arise, please place a TOC consult.

## 2022-04-29 NOTE — Discharge Summary (Signed)
Date of admission: 04/28/2022  Date of discharge: 04/29/2022  Admission diagnosis: BPH, hypotonic bladder  Discharge diagnosis: BPH, hypotonic bladder  Secondary diagnoses: None  History and Physical: For full details, please see admission history and physical. Briefly, [name] is a 61 y.o. year old patient with BPH, hypotonic bladder who underwent TURP.   Hospital Course: The patient recovered in the usual expected fashion.  He had his diet advanced slowly.  Initially managed with IV pain control, then transitioned to PO meds when he was tolerating oral intake.  His labs were stable throughout the hospital course.  He was discharged to home on POD#1.  At the time of discharge the patient was tolerating a regular diet, passing flatus, ambulating, had adequate pain control and was agreeable to discharge.  Follow up as scheduled.    Laboratory values:  Recent Labs    04/28/22 1309 04/28/22 1724 04/29/22 0444  HGB 9.4* 9.5* 8.6*  HCT 31.7* 32.4* 28.7*   Recent Labs    04/28/22 1724 04/29/22 0444  CREATININE 1.45* 1.40*    Disposition: Home  Discharge instruction: The patient was instructed to be ambulatory but told to refrain from heavy lifting, strenuous activity, or driving.   Discharge medications:  Allergies as of 04/29/2022   No Known Allergies      Medication List     STOP taking these medications    oxyCODONE 5 MG immediate release tablet Commonly known as: Oxy IR/ROXICODONE       TAKE these medications    Accu-Chek Aviva Plus test strip Generic drug: glucose blood   Accu-Chek Softclix Lancets lancets   ascorbic acid 1000 MG tablet Commonly known as: VITAMIN C Take 1 tablet (1,000 mg total) by mouth daily.   aspirin 81 MG chewable tablet Chew 81 mg by mouth daily.   BD ULTRA-FINE PEN NEEDLES 29G X 12.7MM Misc Generic drug: Insulin Pen Needle   docusate sodium 100 MG capsule Commonly known as: COLACE Take 100 mg by mouth every other  day.   Lantus SoloStar 100 UNIT/ML Solostar Pen Generic drug: insulin glargine Inject 20 Units into the skin at bedtime.   lisinopril 5 MG tablet Commonly known as: ZESTRIL Take 5 mg by mouth daily.   nutrition supplement (JUVEN) Pack Take 1 packet by mouth 2 (two) times daily between meals.   oxyCODONE-acetaminophen 5-325 MG tablet Commonly known as: Percocet Take 1 tablet by mouth every 4 (four) hours as needed for up to 18 doses for severe pain.   Polyethyl Glycol-Propyl Glycol 0.4-0.3 % Soln Place 1 application  into both eyes daily as needed (Dry eyes).   simvastatin 10 MG tablet Commonly known as: ZOCOR Take 10 mg by mouth at bedtime.   tamsulosin 0.4 MG Caps capsule Commonly known as: FLOMAX Take 0.4 mg by mouth daily.   Vitamin D 50 MCG (2000 UT) tablet Take 2,000 Units by mouth daily.   zinc sulfate 220 (50 Zn) MG capsule Take 1 capsule (220 mg total) by mouth daily.        Followup:   Follow-up Information     [facility] Follow up on 05/02/2022.   Why: 12:45PM Contact information: [address] [place] [address] [phone]                Matt R. Roxine Whittinghill MD [facility]  Pager: [phone]

## 2022-04-30 LAB — SURGICAL PATHOLOGY

## 2022-05-01 ENCOUNTER — Ambulatory Visit: Payer: Medicaid Other | Admitting: Orthopedic Surgery

## 2022-05-12 ENCOUNTER — Encounter: Payer: Self-pay | Admitting: Orthopedic Surgery

## 2022-05-12 ENCOUNTER — Ambulatory Visit (INDEPENDENT_AMBULATORY_CARE_PROVIDER_SITE_OTHER): Payer: Medicaid Other | Admitting: Orthopedic Surgery

## 2022-05-12 DIAGNOSIS — I890 Lymphedema, not elsewhere classified: Secondary | ICD-10-CM | POA: Diagnosis not present

## 2022-05-12 DIAGNOSIS — Z89512 Acquired absence of left leg below knee: Secondary | ICD-10-CM

## 2022-05-12 NOTE — Progress Notes (Signed)
Office Visit Note   Patient: [name]           Date of Birth: 08-Jul-1960           MRN: [id number] Visit Date: 05/12/2022              Requested by: Nolene Ebbs, MD [address] [place],  [place] [address] PCP: Nolene Ebbs, MD  Chief Complaint  Patient presents with   Right Leg - Edema   Left Leg - Edema    Hx left BKA      HPI: Patient is a 61 year old gentleman status post left transtibial amputation.  He does have lymphedema.  Patient has been fitted for his prosthesis.  Patient states he does not want to use his lymphedema pumps or use his leg.  Assessment & Plan: Visit Diagnoses:  1. Lymphedema   2. Hx of BKA, left ([place])     Plan: Discussed that we could set him up with physical therapy to start using his leg.  Patient states he is not interested.  He will continue with his 3 layer of compression on the residual limb.  Follow-Up Instructions: Return in about 3 months (around 08/12/2022).   Ortho Exam  Patient is alert, oriented, no adenopathy, well-dressed, normal affect, normal respiratory effort. Examination the residual limb is well-healed there is no cellulitis there is no swelling within the knee or distal.  The lymphedema in the thigh has improved.  Imaging: No results found. No images are attached to the encounter.  Labs: Lab Results  Component Value Date   HGBA1C 5.8 (H) 04/22/2022   HGBA1C 6.4 (H) 11/30/2020   HGBA1C 7.1 (H) 09/24/2016   ESRSEDRATE 45 (H) 06/17/2014   CRP 1.5 (H) 06/17/2014   REPTSTATUS 06/19/2014 FINAL 06/17/2014   GRAMSTAIN  06/17/2014    RARE WBC PRESENT, PREDOMINANTLY PMN NO SQUAMOUS EPITHELIAL CELLS SEEN NO ORGANISMS SEEN Performed at [facility]  06/17/2014    MULTIPLE ORGANISMS PRESENT, NONE PREDOMINANT Note: NO STAPHYLOCOCCUS AUREUS ISOLATED NO GROUP A STREP (S.PYOGENES) ISOLATED Performed at [facility] 11/16/2007   LABORGA MORGANELLA  MORGANII 11/16/2007     Lab Results  Component Value Date   ALBUMIN 3.4 (L) 07/27/2015   ALBUMIN 3.3 (L) 04/20/2015   ALBUMIN 3.6 06/17/2014    Lab Results  Component Value Date   MG 1.8 11/26/2007   Lab Results  Component Value Date   VD25OH 33 02/18/2022    No results found for: "PREALBUMIN"    Latest Ref Rng & Units 04/29/2022    4:44 AM 04/28/2022    5:24 PM 04/28/2022    1:09 PM  CBC EXTENDED  WBC 4.0 - 10.5 K/uL 12.2  11.1  10.6   RBC 4.22 - 5.81 MIL/uL 3.63  4.01  3.98   Hemoglobin 13.0 - 17.0 g/dL 8.6  9.5  9.4   HCT 39.0 - 52.0 % 28.7  32.4  31.7   Platelets 150 - 400 K/uL 309  327  312      There is no height or weight on file to calculate BMI.  Orders:  No orders of the defined types were placed in this encounter.  No orders of the defined types were placed in this encounter.    Procedures: No procedures performed  Clinical Data: No additional findings.  ROS:  All other systems negative, except as noted in the HPI. Review of Systems  Objective: Vital Signs:  There were no vitals taken for this visit.  Specialty Comments:  No specialty comments available.  PMFS History: Patient Active Problem List   Diagnosis Date Noted   BPH (benign prostatic hyperplasia) 04/28/2022   Subacute osteomyelitis, left ankle and foot ([place]) 11/30/2020   Abscess of bursa of left ankle 11/30/2020   Acute osteomyelitis of toe, right ([place])    Diabetic polyneuropathy associated with type 2 diabetes mellitus ([place]) 08/14/2016   Chronic venous hypertension (idiopathic) with inflammation of bilateral lower extremity 08/14/2016   Ulcer of left heel, limited to breakdown of skin ([place]) 05/15/2016   Amputated toe of right foot ([place]) 07/27/2015   Great toe pain 06/17/2014   Type II diabetes mellitus with peripheral circulatory disorder ([place]) 06/17/2014   Elevated transaminase level 06/17/2014   HTN (hypertension) 06/17/2014   Visual disturbance    Past Medical  History:  Diagnosis Date   Blood transfusion 2009   Diabetes mellitus    type 2   Hyperlipemia    Hypertension    denies   Leg swelling    Osteomyelitis (HCC)    right 5th toe   Visual disturbance     Family History  Problem Relation Age of Onset   Cancer Mother        breast    Past Surgical History:  Procedure Laterality Date   AMPUTATION Left 2012   Left Great Toe   AMPUTATION Right 04/20/2015   Procedure: Right Great Toe Amputation;  Surgeon: Newt Minion, MD;  Location: [place];  Service: Orthopedics;  Laterality: Right;   AMPUTATION Right 07/27/2015   Procedure: Right 2nd Toe Amputation;  Surgeon: Newt Minion, MD;  Location: [place];  Service: Orthopedics;  Laterality: Right;   AMPUTATION Right 09/24/2016   Procedure: Right 5th Toe Amputation;  Surgeon: Newt Minion, MD;  Location: [place];  Service: Orthopedics;  Laterality: Right;   AMPUTATION Right 01/07/2017   Procedure: Right Foot 3rd and 4th Toe Amputation;  Surgeon: Newt Minion, MD;  Location: [place];  Service: Orthopedics;  Laterality: Right;   AMPUTATION Left 11/30/2020   Procedure: LEFT BELOW KNEE AMPUTATION;  Surgeon: Newt Minion, MD;  Location: [place];  Service: Orthopedics;  Laterality: Left;   CHOLECYSTECTOMY  03/2011   EYE SURGERY     lazer   foot surgery  11/2007   small toe on left foot removed.   NASAL SEPTUM SURGERY  1981   Due to broken nose. Surgery was to repair how bone healed.   PARS PLANA VITRECTOMY  11/17/2011   Procedure: PARS PLANA VITRECTOMY WITH 23 GAUGE;  Surgeon: Adonis Brook, MD;  Location: [place];  Service: Ophthalmology;  Laterality: Right;  With Endolaser   TOE AMPUTATION Left    5 th   TONSILLECTOMY     TRANSURETHRAL RESECTION OF PROSTATE N/A 04/28/2022   Procedure: BIPOLAR TRANSURETHRAL RESECTION OF THE PROSTATE (TURP);  Surgeon: Janith Lima, MD;  Location: [facility];  Service: Urology;  Laterality: N/A;  84 MINUTES NEEDED FOR CASE   Social History   Occupational History   Not on  file  Tobacco Use   Smoking status: Former    Years: 5.00    Types: Cigarettes    Quit date: 03/25/1979    Years since quitting: 43.1   Smokeless tobacco: Never  Vaping Use   Vaping Use: Never used  Substance and Sexual Activity   Alcohol use: Yes    Comment: occasional  12 pk of malt  liqour a month   Drug use: Not Currently   Sexual activity: Not on file

## 2022-08-12 ENCOUNTER — Ambulatory Visit (INDEPENDENT_AMBULATORY_CARE_PROVIDER_SITE_OTHER): Payer: Medicaid Other | Admitting: Orthopedic Surgery

## 2022-08-12 DIAGNOSIS — Z89512 Acquired absence of left leg below knee: Secondary | ICD-10-CM

## 2022-08-14 ENCOUNTER — Telehealth: Payer: Self-pay | Admitting: Orthopedic Surgery

## 2022-08-14 NOTE — Telephone Encounter (Signed)
Called patient left message to return call to R/S his appointment with Dr. Sharol Given      Provider will not be in the office

## 2022-08-26 ENCOUNTER — Encounter: Payer: Self-pay | Admitting: Orthopedic Surgery

## 2022-08-26 NOTE — Progress Notes (Signed)
Office Visit Note   Patient: [name]           Date of Birth: [birth year]           MRN: [id number] Visit Date: 08/12/2022              Requested by: Nolene Ebbs, MD [address] [place],  [place] [address] PCP: Nolene Ebbs, MD  Chief Complaint  Patient presents with   Right Leg - Edema   Left Leg - Edema    Hx left BKA      HPI: Patient is a 62 year old gentleman who presents in follow-up for left transtibial amputation.  Currently has a lymphedema pump but is not using it.  Patient's therapy is on hold for prosthetic gait training.  Assessment & Plan: Visit Diagnoses:  1. Hx of BKA, left ([place])     Plan: Prescription is provided for shrinker supplies liner and materials as needed.  Follow-Up Instructions: Return in about 3 months (around 11/10/2022).   Ortho Exam  Patient is alert, oriented, no adenopathy, well-dressed, normal affect, normal respiratory effort. Examination patient is developing increased length to the left residual limb from the lymphedema.  The contour is good.  The thigh and leg are much less swollen.  He is currently wearing 2 compression socks there are no open ulcers no cellulitis.  Patient is an existing left transtibial  amputee.  Patient's current comorbidities are not expected to impact the ability to function with the prescribed prosthesis. Patient verbally communicates a strong desire to use a prosthesis. Patient currently requires mobility aids to ambulate without a prosthesis.  Expects not to use mobility aids with a new prosthesis.  Patient is a K2 level ambulator that will use a prosthesis to walk around their home and the community over low level environmental barriers.      Imaging: No results found. No images are attached to the encounter.  Labs: Lab Results  Component Value Date   HGBA1C 5.8 (H) 04/22/2022   HGBA1C 6.4 (H) 11/30/2020   HGBA1C 7.1 (H) 09/24/2016   ESRSEDRATE 45 (H) 06/17/2014   CRP 1.5 (H)  06/17/2014   REPTSTATUS 06/19/2014 FINAL 06/17/2014   GRAMSTAIN  06/17/2014    RARE WBC PRESENT, PREDOMINANTLY PMN NO SQUAMOUS EPITHELIAL CELLS SEEN NO ORGANISMS SEEN Performed at [facility]  06/17/2014    MULTIPLE ORGANISMS PRESENT, NONE PREDOMINANT Note: NO STAPHYLOCOCCUS AUREUS ISOLATED NO GROUP A STREP (S.PYOGENES) ISOLATED Performed at [facility] 11/16/2007   LABORGA MORGANELLA MORGANII 11/16/2007     Lab Results  Component Value Date   ALBUMIN 3.4 (L) 07/27/2015   ALBUMIN 3.3 (L) 04/20/2015   ALBUMIN 3.6 06/17/2014    Lab Results  Component Value Date   MG 1.8 11/26/2007   Lab Results  Component Value Date   VD25OH 33 02/18/2022    No results found for: "PREALBUMIN"    Latest Ref Rng & Units 04/29/2022    4:44 AM 04/28/2022    5:24 PM 04/28/2022    1:09 PM  CBC EXTENDED  WBC 4.0 - 10.5 K/uL 12.2  11.1  10.6   RBC 4.22 - 5.81 MIL/uL 3.63  4.01  3.98   Hemoglobin 13.0 - 17.0 g/dL 8.6  9.5  9.4   HCT 39.0 - 52.0 % 28.7  32.4  31.7   Platelets 150 - 400 K/uL 309  327  312      There is  no height or weight on file to calculate BMI.  Orders:  No orders of the defined types were placed in this encounter.  No orders of the defined types were placed in this encounter.    Procedures: No procedures performed  Clinical Data: No additional findings.  ROS:  All other systems negative, except as noted in the HPI. Review of Systems  Objective: Vital Signs: There were no vitals taken for this visit.  Specialty Comments:  No specialty comments available.  PMFS History: Patient Active Problem List   Diagnosis Date Noted   BPH (benign prostatic hyperplasia) 04/28/2022   Subacute osteomyelitis, left ankle and foot ([place]) 11/30/2020   Abscess of bursa of left ankle 11/30/2020   Acute osteomyelitis of toe, right ([place])    Diabetic polyneuropathy associated with type 2 diabetes mellitus ([place])  08/14/2016   Chronic venous hypertension (idiopathic) with inflammation of bilateral lower extremity 08/14/2016   Ulcer of left heel, limited to breakdown of skin ([employer]) 05/15/2016   Amputated toe of right foot ([place]) 07/27/2015   Great toe pain 06/17/2014   Type II diabetes mellitus with peripheral circulatory disorder ([place]) 06/17/2014   Elevated transaminase level 06/17/2014   HTN (hypertension) 06/17/2014   Visual disturbance    Past Medical History:  Diagnosis Date   Blood transfusion 2009   Diabetes mellitus    type 2   Hyperlipemia    Hypertension    denies   Leg swelling    Osteomyelitis (HCC)    right 5th toe   Visual disturbance     Family History  Problem Relation Age of Onset   Cancer Mother        breast    Past Surgical History:  Procedure Laterality Date   AMPUTATION Left 2012   Left Great Toe   AMPUTATION Right 04/20/2015   Procedure: Right Great Toe Amputation;  Surgeon: Newt Minion, MD;  Location: [facility];  Service: Orthopedics;  Laterality: Right;   AMPUTATION Right 07/27/2015   Procedure: Right 2nd Toe Amputation;  Surgeon: Newt Minion, MD;  Location: [facility];  Service: Orthopedics;  Laterality: Right;   AMPUTATION Right 09/24/2016   Procedure: Right 5th Toe Amputation;  Surgeon: Newt Minion, MD;  Location: [facility];  Service: Orthopedics;  Laterality: Right;   AMPUTATION Right 01/07/2017   Procedure: Right Foot 3rd and 4th Toe Amputation;  Surgeon: Newt Minion, MD;  Location: [facility];  Service: Orthopedics;  Laterality: Right;   AMPUTATION Left 11/30/2020   Procedure: LEFT BELOW KNEE AMPUTATION;  Surgeon: Newt Minion, MD;  Location: [facility];  Service: Orthopedics;  Laterality: Left;   CHOLECYSTECTOMY  03/2011   EYE SURGERY     lazer   foot surgery  11/2007   small toe on left foot removed.   NASAL SEPTUM SURGERY  1981   Due to broken nose. Surgery was to repair how bone healed.   PARS PLANA VITRECTOMY  11/17/2011   Procedure: PARS PLANA VITRECTOMY WITH 23  GAUGE;  Surgeon: Adonis Brook, MD;  Location: [facility];  Service: Ophthalmology;  Laterality: Right;  With Endolaser   TOE AMPUTATION Left    5 th   TONSILLECTOMY     TRANSURETHRAL RESECTION OF PROSTATE N/A 04/28/2022   Procedure: BIPOLAR TRANSURETHRAL RESECTION OF THE PROSTATE (TURP);  Surgeon: Janith Lima, MD;  Location: [facility];  Service: Urology;  Laterality: N/A;  [place] FOR CASE   Social History   Occupational History  Not on file  Tobacco Use   Smoking status: Former    Years: 5.00    Types: Cigarettes    Quit date: 03/25/1979    Years since quitting: 43.4   Smokeless tobacco: Never  Vaping Use   Vaping Use: Never used  Substance and Sexual Activity   Alcohol use: Yes    Comment: occasional  12 pk of malt liqour a month   Drug use: Not Currently   Sexual activity: Not on file

## 2022-11-10 ENCOUNTER — Ambulatory Visit: Payer: Medicaid Other | Admitting: Orthopedic Surgery

## 2022-11-11 ENCOUNTER — Ambulatory Visit (INDEPENDENT_AMBULATORY_CARE_PROVIDER_SITE_OTHER): Payer: Medicaid Other | Admitting: Orthopedic Surgery

## 2022-11-11 ENCOUNTER — Other Ambulatory Visit (INDEPENDENT_AMBULATORY_CARE_PROVIDER_SITE_OTHER): Payer: Medicaid Other

## 2022-11-11 DIAGNOSIS — Z89512 Acquired absence of left leg below knee: Secondary | ICD-10-CM

## 2022-11-11 DIAGNOSIS — M25511 Pain in right shoulder: Secondary | ICD-10-CM

## 2022-11-13 ENCOUNTER — Encounter: Payer: Self-pay | Admitting: Orthopedic Surgery

## 2022-11-13 DIAGNOSIS — M25511 Pain in right shoulder: Secondary | ICD-10-CM

## 2022-11-13 MED ORDER — METHYLPREDNISOLONE ACETATE 40 MG/ML IJ SUSP
40.0000 mg | INTRAMUSCULAR | Status: AC | PRN
  Administered 2022-11-13: 40 mg via INTRA_ARTICULAR

## 2022-11-13 MED ORDER — LIDOCAINE HCL 1 % IJ SOLN
5.0000 mL | INTRAMUSCULAR | Status: AC | PRN
  Administered 2022-11-13: 5 mL

## 2022-11-13 NOTE — Progress Notes (Signed)
Office Visit Note   Patient: [name]           Date of Birth: Jun 23, 1960           MRN: [id number] Visit Date: 11/11/2022              Requested by: Fleet Contras, MD [address] [place],  [place] [address] PCP: Fleet Contras, MD  Chief Complaint  Patient presents with   Right Leg - Edema, Follow-up   Left Leg - Edema, Follow-up    Hx left BKA      HPI: Patient is a 62 year old gentleman left transtibial amputee.  Patient has had a prescription for [employer] for new prosthetic supplies.  Patient states that he acutely injured his right shoulder with 2 to 3 days of pain anteriorly over the shoulder.  Assessment & Plan: Visit Diagnoses:  1. Acute pain of right shoulder   2. Hx of BKA, left (HCC)     Plan: Right shoulder was injected in the subacromial space.  Patient will follow-up with [employer] for prosthetic evaluation.  Follow-Up Instructions: Return in about 4 weeks (around 12/09/2022).   Ortho Exam  Patient is alert, oriented, no adenopathy, well-dressed, normal affect, normal respiratory effort. Examination of the right shoulder patient has good active and passive range of motion.  He has pain to palpation of the biceps tendon and pain with Neer and Hawkins impingement test.  Imaging: No results found. No images are attached to the encounter.  Labs: Lab Results  Component Value Date   HGBA1C 5.8 (H) 04/22/2022   HGBA1C 6.4 (H) 11/30/2020   HGBA1C 7.1 (H) 09/24/2016   ESRSEDRATE 45 (H) 06/17/2014   CRP 1.5 (H) 06/17/2014   REPTSTATUS 06/19/2014 FINAL 06/17/2014   GRAMSTAIN  06/17/2014    RARE WBC PRESENT, PREDOMINANTLY PMN NO SQUAMOUS EPITHELIAL CELLS SEEN NO ORGANISMS SEEN Performed at [facility]    CULT  06/17/2014    MULTIPLE ORGANISMS PRESENT, NONE PREDOMINANT Note: NO STAPHYLOCOCCUS AUREUS ISOLATED NO GROUP A STREP (S.PYOGENES) ISOLATED Performed at [facility]    LABORGA STAPHYLOCOCCUS AUREUS 11/16/2007   LABORGA  MORGANELLA MORGANII 11/16/2007     Lab Results  Component Value Date   ALBUMIN 3.4 (L) 07/27/2015   ALBUMIN 3.3 (L) 04/20/2015   ALBUMIN 3.6 06/17/2014    Lab Results  Component Value Date   MG 1.8 11/26/2007   Lab Results  Component Value Date   VD25OH 33 02/18/2022    No results found for: "PREALBUMIN"    Latest Ref Rng & Units 04/29/2022    4:44 AM 04/28/2022    5:24 PM 04/28/2022    1:09 PM  CBC EXTENDED  WBC 4.0 - 10.5 K/uL 12.2  11.1  10.6   RBC 4.22 - 5.81 MIL/uL 3.63  4.01  3.98   Hemoglobin 13.0 - 17.0 g/dL 8.6  9.5  9.4   HCT 47.4 - 52.0 % 28.7  32.4  31.7   Platelets 150 - 400 K/uL 309  327  312      There is no height or weight on file to calculate BMI.  Orders:  Orders Placed This Encounter  Procedures   XR Shoulder Right   No orders of the defined types were placed in this encounter.    Procedures: Large Joint Inj: R subacromial bursa on 11/13/2022 8:10 AM Indications: diagnostic evaluation and pain Details: 22 G 1.5 in needle, posterior approach  Arthrogram: No  Medications: 5 mL lidocaine 1 %; 40  mg methylPREDNISolone acetate 40 MG/ML Outcome: tolerated well, no immediate complications Procedure, treatment alternatives, risks and benefits explained, specific risks discussed. Consent was given by the patient. Immediately prior to procedure a time out was called to verify the correct patient, procedure, equipment, support staff and site/side marked as required. Patient was prepped and draped in the usual sterile fashion.      Clinical Data: No additional findings.  ROS:  All other systems negative, except as noted in the HPI. Review of Systems  Objective: Vital Signs: There were no vitals taken for this visit.  Specialty Comments:  No specialty comments available.  PMFS History: Patient Active Problem List   Diagnosis Date Noted   BPH (benign prostatic hyperplasia) 04/28/2022   Subacute osteomyelitis, left ankle and foot (HCC)  11/30/2020   Abscess of bursa of left ankle 11/30/2020   Acute osteomyelitis of toe, right (HCC)    Diabetic polyneuropathy associated with type 2 diabetes mellitus (HCC) 08/14/2016   Chronic venous hypertension (idiopathic) with inflammation of bilateral lower extremity 08/14/2016   Ulcer of left heel, limited to breakdown of skin (HCC) 05/15/2016   Amputated toe of right foot (HCC) 07/27/2015   Great toe pain 06/17/2014   Type II diabetes mellitus with peripheral circulatory disorder (HCC) 06/17/2014   Elevated transaminase level 06/17/2014   HTN (hypertension) 06/17/2014   Visual disturbance    Past Medical History:  Diagnosis Date   Blood transfusion 2009   Diabetes mellitus    type 2   Hyperlipemia    Hypertension    denies   Leg swelling    Osteomyelitis (HCC)    right 5th toe   Visual disturbance     Family History  Problem Relation Age of Onset   Cancer Mother        breast    Past Surgical History:  Procedure Laterality Date   AMPUTATION Left 2012   Left Great Toe   AMPUTATION Right 04/20/2015   Procedure: Right Great Toe Amputation;  Surgeon: [name], MD;  Location[facility];  Service: Orthopedics;  Laterality: Right;   AMPUTATION Right 07/27/2015   Procedure: Right 2nd Toe Amputation;  Surgeon: [name], MD;  Location: [facility] OR;  Service: Orthopedics;  Laterality: Right;   AMPUTATION Right 09/24/2016   Procedure: Right 5th Toe Amputation;  Surgeon: [name], MD;  Location: [facility] OR;  Service: Orthopedics;  Laterality: Right;   AMPUTATION Right 01/07/2017   Procedure: Right Foot 3rd and 4th Toe Amputation;  Surgeon: [name], MD;  Location: [facility] OR;  Service: Orthopedics;  Laterality: Right;   AMPUTATION Left 11/30/2020   Procedure: LEFT BELOW KNEE AMPUTATION;  Surgeon: [name], MD;  [facility];  Service: Orthopedics;  Laterality: Left;   CHOLECYSTECTOMY  03/2011   EYE SURGERY     lazer   foot surgery  11/2007   small toe on left foot  removed.   NASAL SEPTUM SURGERY  1981   Due to broken nose. Surgery was to repair how bone healed.   PARS PLANA VITRECTOMY  11/17/2011   Procedure: PARS PLANA VITRECTOMY WITH 23 GAUGE;  Surgeon: Shade Flood, MD;  Location: [facility] OR;  Service: Ophthalmology;  Laterality: Right;  With Endolaser   TOE AMPUTATION Left    5 th   TONSILLECTOMY     TRANSURETHRAL RESECTION OF PROSTATE N/A 04/28/2022   Procedure: BIPOLAR TRANSURETHRAL RESECTION OF THE PROSTATE (TURP);  Surgeon: Jannifer Hick, MD;  Location[facility];  Service: Urology;  Laterality: N/A;  90 MINUTES NEEDED FOR CASE   Social History   Occupational History   Not on file  Tobacco Use   Smoking status: Former    Years: 5    Types: Cigarettes    Quit date: 03/25/1979    Years since quitting: 43.6   Smokeless tobacco: Never  Vaping Use   Vaping Use: Never used  Substance and Sexual Activity   Alcohol use: Yes    Comment: occasional  12 pk of malt liqour a month   Drug use: Not Currently   Sexual activity: Not on file

## 2022-12-12 ENCOUNTER — Ambulatory Visit: Payer: Medicaid Other | Admitting: Family

## 2024-01-14 ENCOUNTER — Other Ambulatory Visit: Payer: Self-pay

## 2024-01-14 ENCOUNTER — Emergency Department (HOSPITAL_COMMUNITY)

## 2024-01-14 ENCOUNTER — Inpatient Hospital Stay (HOSPITAL_COMMUNITY)
Admission: EM | Admit: 2024-01-14 | Discharge: 2024-01-21 | DRG: 853 | Disposition: A | Discharge status: Skilled Nursing Facility | Attending: Infectious Diseases | Admitting: Infectious Diseases

## 2024-01-14 ENCOUNTER — Encounter (HOSPITAL_COMMUNITY): Payer: Self-pay

## 2024-01-14 DIAGNOSIS — W050XXA Fall from non-moving wheelchair, initial encounter: Secondary | ICD-10-CM | POA: Diagnosis present

## 2024-01-14 DIAGNOSIS — K3580 Unspecified acute appendicitis: Secondary | ICD-10-CM

## 2024-01-14 DIAGNOSIS — Z7985 Long-term (current) use of injectable non-insulin antidiabetic drugs: Secondary | ICD-10-CM

## 2024-01-14 DIAGNOSIS — Z89512 Acquired absence of left leg below knee: Secondary | ICD-10-CM | POA: Diagnosis not present

## 2024-01-14 DIAGNOSIS — E8809 Other disorders of plasma-protein metabolism, not elsewhere classified: Secondary | ICD-10-CM | POA: Diagnosis present

## 2024-01-14 DIAGNOSIS — K529 Noninfective gastroenteritis and colitis, unspecified: Secondary | ICD-10-CM | POA: Diagnosis present

## 2024-01-14 DIAGNOSIS — Z6836 Body mass index (BMI) 36.0-36.9, adult: Secondary | ICD-10-CM

## 2024-01-14 DIAGNOSIS — Z794 Long term (current) use of insulin: Secondary | ICD-10-CM | POA: Diagnosis not present

## 2024-01-14 DIAGNOSIS — N189 Chronic kidney disease, unspecified: Secondary | ICD-10-CM | POA: Diagnosis present

## 2024-01-14 DIAGNOSIS — I129 Hypertensive chronic kidney disease with stage 1 through stage 4 chronic kidney disease, or unspecified chronic kidney disease: Secondary | ICD-10-CM | POA: Diagnosis present

## 2024-01-14 DIAGNOSIS — I480 Paroxysmal atrial fibrillation: Secondary | ICD-10-CM | POA: Diagnosis present

## 2024-01-14 DIAGNOSIS — I10 Essential (primary) hypertension: Secondary | ICD-10-CM | POA: Diagnosis present

## 2024-01-14 DIAGNOSIS — F209 Schizophrenia, unspecified: Secondary | ICD-10-CM | POA: Diagnosis present

## 2024-01-14 DIAGNOSIS — K35211 Acute appendicitis with generalized peritonitis, with perforation and abscess: Secondary | ICD-10-CM | POA: Diagnosis present

## 2024-01-14 DIAGNOSIS — N390 Urinary tract infection, site not specified: Secondary | ICD-10-CM | POA: Diagnosis not present

## 2024-01-14 DIAGNOSIS — G4733 Obstructive sleep apnea (adult) (pediatric): Secondary | ICD-10-CM | POA: Diagnosis present

## 2024-01-14 DIAGNOSIS — R6520 Severe sepsis without septic shock: Secondary | ICD-10-CM | POA: Diagnosis not present

## 2024-01-14 DIAGNOSIS — K3532 Acute appendicitis with perforation and localized peritonitis, without abscess: Secondary | ICD-10-CM | POA: Diagnosis not present

## 2024-01-14 DIAGNOSIS — Z993 Dependence on wheelchair: Secondary | ICD-10-CM

## 2024-01-14 DIAGNOSIS — Z9714 Presence of artificial left leg (complete) (partial): Secondary | ICD-10-CM

## 2024-01-14 DIAGNOSIS — R6521 Severe sepsis with septic shock: Secondary | ICD-10-CM | POA: Diagnosis present

## 2024-01-14 DIAGNOSIS — Y92009 Unspecified place in unspecified non-institutional (private) residence as the place of occurrence of the external cause: Secondary | ICD-10-CM

## 2024-01-14 DIAGNOSIS — Z87891 Personal history of nicotine dependence: Secondary | ICD-10-CM

## 2024-01-14 DIAGNOSIS — I4892 Unspecified atrial flutter: Secondary | ICD-10-CM | POA: Diagnosis present

## 2024-01-14 DIAGNOSIS — A419 Sepsis, unspecified organism: Principal | ICD-10-CM

## 2024-01-14 DIAGNOSIS — E872 Acidosis, unspecified: Secondary | ICD-10-CM | POA: Diagnosis present

## 2024-01-14 DIAGNOSIS — Z79899 Other long term (current) drug therapy: Secondary | ICD-10-CM

## 2024-01-14 DIAGNOSIS — E1165 Type 2 diabetes mellitus with hyperglycemia: Secondary | ICD-10-CM | POA: Diagnosis present

## 2024-01-14 DIAGNOSIS — K567 Ileus, unspecified: Secondary | ICD-10-CM | POA: Diagnosis not present

## 2024-01-14 DIAGNOSIS — Z89411 Acquired absence of right great toe: Secondary | ICD-10-CM

## 2024-01-14 DIAGNOSIS — E1151 Type 2 diabetes mellitus with diabetic peripheral angiopathy without gangrene: Secondary | ICD-10-CM | POA: Diagnosis present

## 2024-01-14 DIAGNOSIS — E669 Obesity, unspecified: Secondary | ICD-10-CM | POA: Diagnosis present

## 2024-01-14 DIAGNOSIS — Z7901 Long term (current) use of anticoagulants: Secondary | ICD-10-CM

## 2024-01-14 DIAGNOSIS — Z1152 Encounter for screening for COVID-19: Secondary | ICD-10-CM | POA: Diagnosis not present

## 2024-01-14 DIAGNOSIS — E1140 Type 2 diabetes mellitus with diabetic neuropathy, unspecified: Secondary | ICD-10-CM | POA: Diagnosis present

## 2024-01-14 DIAGNOSIS — Z5982 Transportation insecurity: Secondary | ICD-10-CM

## 2024-01-14 DIAGNOSIS — E1159 Type 2 diabetes mellitus with other circulatory complications: Secondary | ICD-10-CM | POA: Diagnosis not present

## 2024-01-14 DIAGNOSIS — N179 Acute kidney failure, unspecified: Secondary | ICD-10-CM

## 2024-01-14 DIAGNOSIS — I700 Atherosclerosis of aorta: Secondary | ICD-10-CM | POA: Diagnosis present

## 2024-01-14 DIAGNOSIS — E876 Hypokalemia: Secondary | ICD-10-CM | POA: Diagnosis not present

## 2024-01-14 DIAGNOSIS — M6282 Rhabdomyolysis: Secondary | ICD-10-CM | POA: Diagnosis present

## 2024-01-14 DIAGNOSIS — E860 Dehydration: Secondary | ICD-10-CM | POA: Diagnosis present

## 2024-01-14 DIAGNOSIS — I483 Typical atrial flutter: Secondary | ICD-10-CM | POA: Diagnosis not present

## 2024-01-14 DIAGNOSIS — N400 Enlarged prostate without lower urinary tract symptoms: Secondary | ICD-10-CM | POA: Diagnosis present

## 2024-01-14 DIAGNOSIS — E785 Hyperlipidemia, unspecified: Secondary | ICD-10-CM | POA: Diagnosis present

## 2024-01-14 DIAGNOSIS — E1152 Type 2 diabetes mellitus with diabetic peripheral angiopathy with gangrene: Secondary | ICD-10-CM | POA: Diagnosis not present

## 2024-01-14 DIAGNOSIS — Z89412 Acquired absence of left great toe: Secondary | ICD-10-CM

## 2024-01-14 DIAGNOSIS — E1122 Type 2 diabetes mellitus with diabetic chronic kidney disease: Secondary | ICD-10-CM | POA: Diagnosis present

## 2024-01-14 DIAGNOSIS — K3533 Acute appendicitis with perforation and localized peritonitis, with abscess: Secondary | ICD-10-CM | POA: Diagnosis not present

## 2024-01-14 DIAGNOSIS — Z89421 Acquired absence of other right toe(s): Secondary | ICD-10-CM

## 2024-01-14 DIAGNOSIS — T796XXD Traumatic ischemia of muscle, subsequent encounter: Secondary | ICD-10-CM | POA: Diagnosis not present

## 2024-01-14 DIAGNOSIS — Z7982 Long term (current) use of aspirin: Secondary | ICD-10-CM

## 2024-01-14 DIAGNOSIS — K35219 Acute appendicitis with generalized peritonitis, with abscess, unspecified as to perforation: Secondary | ICD-10-CM | POA: Diagnosis not present

## 2024-01-14 DIAGNOSIS — Z9079 Acquired absence of other genital organ(s): Secondary | ICD-10-CM

## 2024-01-14 LAB — COMPREHENSIVE METABOLIC PANEL WITH GFR
ALT: 21 U/L (ref 0–44)
AST: 50 U/L — ABNORMAL HIGH (ref 15–41)
Albumin: 3.1 g/dL — ABNORMAL LOW (ref 3.5–5.0)
Alkaline Phosphatase: 63 U/L (ref 38–126)
Anion gap: 15 (ref 5–15)
BUN: 36 mg/dL — ABNORMAL HIGH (ref 8–23)
CO2: 23 mmol/L (ref 22–32)
Calcium: 8.7 mg/dL — ABNORMAL LOW (ref 8.9–10.3)
Chloride: 102 mmol/L (ref 98–111)
Creatinine, Ser: 2.47 mg/dL — ABNORMAL HIGH (ref 0.61–1.24)
GFR, Estimated: 28 mL/min — ABNORMAL LOW (ref >60)
Glucose, Bld: 240 mg/dL — ABNORMAL HIGH (ref 70–99)
Potassium: 3.5 mmol/L (ref 3.5–5.1)
Sodium: 140 mmol/L (ref 135–145)
Total Bilirubin: 0.7 mg/dL (ref 0.0–1.2)
Total Protein: 7.4 g/dL (ref 6.5–8.1)

## 2024-01-14 LAB — I-STAT CG4 LACTIC ACID, ED
Lactic Acid, Venous: 5.5 mmol/L (ref 0.5–1.9)
Lactic Acid, Venous: 2.4 mmol/L (ref 0.5–1.9)

## 2024-01-14 LAB — URINALYSIS, W/ REFLEX TO CULTURE (INFECTION SUSPECTED)
Bilirubin Urine: NEGATIVE
Glucose, UA: 50 mg/dL — AB
Ketones, ur: NEGATIVE mg/dL
Nitrite: NEGATIVE
Protein, ur: 100 mg/dL — AB
Specific Gravity, Urine: 1.017 (ref 1.005–1.030)
WBC, UA: >50 WBC/hpf (ref 0–5)
pH: 5 (ref 5.0–8.0)

## 2024-01-14 LAB — CBC WITH DIFFERENTIAL/PLATELET
Basophils Absolute: 0 K/uL (ref 0.0–0.1)
Basophils Relative: 0 %
Eosinophils Absolute: 0 K/uL (ref 0.0–0.5)
Eosinophils Relative: 0 %
HCT: 44 % (ref 39.0–52.0)
Hemoglobin: 13.5 g/dL (ref 13.0–17.0)
Lymphocytes Relative: 1 %
Lymphs Abs: 0.2 K/uL — ABNORMAL LOW (ref 0.7–4.0)
MCH: 25 pg — ABNORMAL LOW (ref 26.0–34.0)
MCHC: 30.7 g/dL (ref 30.0–36.0)
MCV: 81.6 fL (ref 80.0–100.0)
Monocytes Absolute: 0.8 K/uL (ref 0.1–1.0)
Monocytes Relative: 4 %
Neutro Abs: 18 K/uL — ABNORMAL HIGH (ref 1.7–7.7)
Neutrophils Relative %: 95 %
Platelets: 296 K/uL (ref 150–400)
RBC: 5.39 MIL/uL (ref 4.22–5.81)
RDW: 16.1 % — ABNORMAL HIGH (ref 11.5–15.5)
WBC: 18.9 K/uL — ABNORMAL HIGH (ref 4.0–10.5)
nRBC: 0 % (ref 0.0–0.2)

## 2024-01-14 LAB — HIV ANTIBODY (ROUTINE TESTING W REFLEX): HIV Screen 4th Generation wRfx: NONREACTIVE

## 2024-01-14 LAB — TYPE AND SCREEN
ABO/RH(D): AB POS
Antibody Screen: NEGATIVE

## 2024-01-14 LAB — RESP PANEL BY RT-PCR (RSV, FLU A&B, COVID)  RVPGX2
Influenza A by PCR: NEGATIVE
Influenza B by PCR: NEGATIVE
Resp Syncytial Virus by PCR: NEGATIVE
SARS Coronavirus 2 by RT PCR: NEGATIVE

## 2024-01-14 LAB — CBG MONITORING, ED
Glucose-Capillary: 213 mg/dL — ABNORMAL HIGH (ref 70–99)
Glucose-Capillary: 137 mg/dL — ABNORMAL HIGH (ref 70–99)

## 2024-01-14 LAB — PROTIME-INR
INR: 1.5 — ABNORMAL HIGH (ref 0.8–1.2)
Prothrombin Time: 18.4 s — ABNORMAL HIGH (ref 11.4–15.2)

## 2024-01-14 LAB — LACTIC ACID, PLASMA: Lactic Acid, Venous: 1.4 mmol/L (ref 0.5–1.9)

## 2024-01-14 LAB — LIPASE, BLOOD: Lipase: 31 U/L (ref 11–51)

## 2024-01-14 LAB — CK: Total CK: 2755 U/L — ABNORMAL HIGH (ref 49–397)

## 2024-01-14 MED ORDER — LACTATED RINGERS IV SOLN: INTRAVENOUS | Status: AC

## 2024-01-14 MED ORDER — MORPHINE SULFATE (PF) 4 MG/ML IV SOLN
4.0000 mg | Freq: Once | INTRAVENOUS | Status: AC
  Administered 2024-01-14: 4 mg via INTRAVENOUS
  Filled 2024-01-14: qty 1

## 2024-01-14 MED ORDER — HYDROMORPHONE HCL 1 MG/ML IJ SOLN: 0.5000 mg | INTRAMUSCULAR | Status: DC | PRN

## 2024-01-14 MED ORDER — LACTATED RINGERS IV BOLUS
1000.0000 mL | Freq: Once | INTRAVENOUS | Status: AC
  Administered 2024-01-14: 1000 mL via INTRAVENOUS

## 2024-01-14 MED ORDER — SODIUM CHLORIDE 0.9 % IV BOLUS
1000.0000 mL | Freq: Once | INTRAVENOUS | Status: AC
  Administered 2024-01-14: 1000 mL via INTRAVENOUS

## 2024-01-14 MED ORDER — ACETAMINOPHEN 325 MG PO TABS
650.0000 mg | ORAL_TABLET | Freq: Four times a day (QID) | ORAL | Status: AC | PRN
Start: 2024-01-14 — End: ?
  Administered 2024-01-15: 650 mg via ORAL
  Filled 2024-01-14 (×2): qty 2

## 2024-01-14 MED ORDER — METRONIDAZOLE 500 MG/100ML IV SOLN
500.0000 mg | Freq: Two times a day (BID) | INTRAVENOUS | Status: DC
  Administered 2024-01-14 – 2024-01-16 (×4): 500 mg via INTRAVENOUS
  Filled 2024-01-14 (×4): qty 100

## 2024-01-14 MED ORDER — SODIUM CHLORIDE 0.9 % IV SOLN
2.0000 g | INTRAVENOUS | Status: DC
  Administered 2024-01-15 – 2024-01-16 (×2): 2 g via INTRAVENOUS
  Filled 2024-01-14 (×2): qty 20

## 2024-01-14 MED ORDER — INSULIN ASPART 100 UNIT/ML IJ SOLN
0.0000 [IU] | Freq: Three times a day (TID) | INTRAMUSCULAR | Status: DC
  Administered 2024-01-15 (×2): 3 [IU] via SUBCUTANEOUS
  Administered 2024-01-16: 2 [IU] via SUBCUTANEOUS
  Administered 2024-01-16: 3 [IU] via SUBCUTANEOUS
  Administered 2024-01-16: 2 [IU] via SUBCUTANEOUS
  Administered 2024-01-17 (×2): 3 [IU] via SUBCUTANEOUS
  Administered 2024-01-17: 5 [IU] via SUBCUTANEOUS
  Administered 2024-01-18 – 2024-01-19 (×5): 3 [IU] via SUBCUTANEOUS
  Administered 2024-01-19: 5 [IU] via SUBCUTANEOUS
  Administered 2024-01-20: 3 [IU] via SUBCUTANEOUS
  Administered 2024-01-20: 8 [IU] via SUBCUTANEOUS
  Administered 2024-01-20: 3 [IU] via SUBCUTANEOUS
  Administered 2024-01-21: 5 [IU] via SUBCUTANEOUS
  Administered 2024-01-21: 3 [IU] via SUBCUTANEOUS

## 2024-01-14 MED ORDER — OXYCODONE HCL 5 MG PO TABS: 5.0000 mg | ORAL_TABLET | ORAL | Status: DC | PRN

## 2024-01-14 MED ORDER — INSULIN ASPART 100 UNIT/ML IJ SOLN
0.0000 [IU] | Freq: Every day | INTRAMUSCULAR | Status: DC
  Administered 2024-01-18 – 2024-01-19 (×2): 2 [IU] via SUBCUTANEOUS

## 2024-01-14 MED ORDER — ACETAMINOPHEN 650 MG RE SUPP: 650.0000 mg | Freq: Four times a day (QID) | RECTAL | Status: DC | PRN

## 2024-01-14 MED ORDER — PIPERACILLIN-TAZOBACTAM 3.375 G IVPB 30 MIN
3.3750 g | INTRAVENOUS | Status: AC
  Administered 2024-01-14: 3.375 g via INTRAVENOUS
  Filled 2024-01-14: qty 50

## 2024-01-14 MED ORDER — VANCOMYCIN VARIABLE DOSE PER UNSTABLE RENAL FUNCTION (PHARMACIST DOSING): Status: DC

## 2024-01-14 MED ORDER — VANCOMYCIN HCL 2000 MG/400ML IV SOLN
2000.0000 mg | Freq: Once | INTRAVENOUS | Status: AC
  Administered 2024-01-14: 2000 mg via INTRAVENOUS
  Filled 2024-01-14: qty 400

## 2024-01-14 MED ORDER — HEPARIN SODIUM (PORCINE) 5000 UNIT/ML IJ SOLN
5000.0000 [IU] | Freq: Three times a day (TID) | INTRAMUSCULAR | Status: DC
  Administered 2024-01-14 – 2024-01-20 (×17): 5000 [IU] via SUBCUTANEOUS
  Filled 2024-01-14 (×16): qty 1

## 2024-01-14 MED ORDER — ONDANSETRON HCL 4 MG/2ML IJ SOLN
4.0000 mg | Freq: Once | INTRAMUSCULAR | Status: AC
  Administered 2024-01-14: 4 mg via INTRAVENOUS
  Filled 2024-01-14: qty 2

## 2024-01-14 MED ORDER — POLYETHYLENE GLYCOL 3350 17 G PO PACK
17.0000 g | PACK | Freq: Every day | ORAL | Status: AC | PRN
Start: 2024-01-14 — End: ?

## 2024-01-14 MED ORDER — ACETAMINOPHEN 325 MG PO TABS
975.0000 mg | ORAL_TABLET | Freq: Once | ORAL | Status: AC
  Administered 2024-01-14: 975 mg via ORAL
  Filled 2024-01-14: qty 3

## 2024-01-14 MED ORDER — METOCLOPRAMIDE HCL 5 MG/ML IJ SOLN
10.0000 mg | Freq: Once | INTRAMUSCULAR | Status: AC
  Administered 2024-01-14: 10 mg via INTRAVENOUS
  Filled 2024-01-14: qty 2

## 2024-01-14 MED ORDER — PANTOPRAZOLE SODIUM 40 MG IV SOLR
40.0000 mg | Freq: Once | INTRAVENOUS | Status: AC
  Administered 2024-01-14: 40 mg via INTRAVENOUS
  Filled 2024-01-14: qty 10

## 2024-01-14 NOTE — Progress Notes (Signed)
 Pharmacy Antibiotic Note  [name] is a 63 y.o. male admitted on 01/14/2024 with sepsis.  Pharmacy has been consulted for Vancomycin  dosing. Pt also on Rocephin  and Flagyl .  Pt with AKI - SCr up to 2.47.  Plan: Vanc 2 gm IV loading dose given Will f/u Scr in a.m. for further Vanc dosing      Temp (24hrs), Avg:100.6 F (38.1 C), Min:99.3 F (37.4 C), Max:101.8 F (38.8 C)  Recent Labs  Lab 01/14/24 1342 01/14/24 1357 01/14/24 1619  WBC 18.9*  --   --   CREATININE 2.47*  --   --   LATICACIDVEN  --  5.5* 2.4*    CrCl cannot be calculated (Unknown ideal weight.).    No Known Allergies  Antimicrobials this admission: 7/31 Zosyn  x 1 7/31 Vanc >>  7/31 Rocephin  >>  7/31 Flagyl  >>  Microbiology results: 7/31 BCx:   Thank you for allowing pharmacy to be a part of this patient's care.  Vito Ralph, PharmD, BCPS Please see amion for complete clinical pharmacist phone list 01/14/2024 4:29 PM

## 2024-01-14 NOTE — Progress Notes (Signed)
 ED Pharmacy Antibiotic Sign Off An antibiotic consult was received from an ED provider for Vancomycin  and Zosyn  per pharmacy dosing for sepsis. A chart review was completed to assess appropriateness.   The following one time order(s) were placed:  Vancomycin  2gm IV and Zosyn  3.375gm IV  Further antibiotic and/or antibiotic pharmacy consults should be ordered by the admitting provider if indicated.   Thank you for allowing pharmacy to be a part of this patient's care.   Vito Ralph, PharmD, BCPS Please see amion for complete clinical pharmacist phone list 01/14/24 3:12 PM

## 2024-01-14 NOTE — ED Provider Notes (Signed)
 [facility] Provider Note   CSN: [id number] Arrival date & time: 01/14/24  1328     Patient presents with: Emesis   [name] is a 63 y.o. male.  With a history of type 2 diabetes status post left BKA, right toes and osteomyelitis who presents to the ED after being found down.  Patient reports falling out of his wheelchair around 2200 last night.  He was unable to get up under his own power and remained on the bathroom floor overnight.  EMS noted him to be covered in vomit blood which was dark in color.  Patient states he was in his normal state of health last night prior to the fall.  Denies any traumatic injury.  No head trauma loss of consciousness.  Denies current headache chest pain shortness of breath abdominal pain changes in bowel habits.  No anticoagulation based on outpatient pharmacy records    Emesis      Prior to Admission medications   Medication Sig Start Date End Date Taking? Authorizing Provider  ACCU-CHEK AVIVA PLUS test strip  06/10/16   [provider]  ACCU-CHEK SOFTCLIX LANCETS lancets  06/11/16   [provider]  ascorbic acid  (VITAMIN C) 1000 MG tablet Take 1 tablet (1,000 mg total) by mouth daily. Patient not taking: Reported on 04/17/2022 12/03/20   Persons, Ronal Dragon, [place]  aspirin  81 MG chewable tablet Chew 81 mg by mouth daily.    [provider]  BD ULTRA-FINE PEN NEEDLES 29G X 12.7MM MISC  05/30/16   [provider]  Cholecalciferol  (VITAMIN D ) 50 MCG (2000 UT) tablet Take 2,000 Units by mouth daily. 11/15/21   [provider]  docusate sodium  (COLACE) 100 MG capsule Take 100 mg by mouth every other day.    [provider]  LANTUS  SOLOSTAR 100 UNIT/ML Solostar Pen Inject 20 Units into the skin at bedtime.  07/10/16   [provider]  lisinopril  (PRINIVIL ,ZESTRIL ) 5 MG tablet Take 5 mg by mouth daily.    [provider]  nutrition supplement,  JUVEN, (JUVEN) PACK Take 1 packet by mouth 2 (two) times daily between meals. Patient not taking: Reported on 04/17/2022 12/03/20   Persons, Ronal Dragon, [place]  oxyCODONE -acetaminophen  (PERCOCET) 5-325 MG tablet Take 1 tablet by mouth every 4 (four) hours as needed for up to 18 doses for severe pain. 04/28/22   Selma Donnice SAUNDERS, MD  Polyethyl Glycol-Propyl Glycol 0.4-0.3 % SOLN Place 1 application  into both eyes daily as needed (Dry eyes).    [provider]  simvastatin  (ZOCOR ) 10 MG tablet Take 10 mg by mouth at bedtime.     [provider]  tamsulosin  (FLOMAX ) 0.4 MG CAPS capsule Take 0.4 mg by mouth daily. 11/14/20   [provider]  zinc  sulfate 220 (50 Zn) MG capsule Take 1 capsule (220 mg total) by mouth daily. Patient not taking: Reported on 04/17/2022 12/03/20   Persons, Ronal Dragon, [place]    Allergies: Patient has no known allergies.    Review of Systems  Gastrointestinal:  Positive for vomiting.    Updated Vital Signs BP (!) 182/92   Pulse (!) 132   Temp (S) (!) 101.8 F (38.8 C) (Axillary)   Resp 16   SpO2 99%   Physical Exam Vitals and nursing note reviewed.  HENT:     Head: Normocephalic and atraumatic.  Eyes:     Pupils: Pupils are equal, round, and reactive to light.  Cardiovascular:     Rate and Rhythm: Normal rate and regular rhythm.  Pulmonary:     Effort: Pulmonary effort is normal.     Breath sounds: Normal breath sounds.  Abdominal:     Palpations: Abdomen is soft.     Tenderness: There is no abdominal tenderness.  Musculoskeletal:     Comments: Status post left BKA Status post amputation of all toes on the right foot No overlying skin changes No sacral decubitus ulcer   Skin:    General: Skin is warm and dry.  Neurological:     General: No focal deficit present.     Mental Status: He is alert.     Sensory: No sensory deficit.     Motor: No weakness.  Psychiatric:        Mood and Affect: Mood normal.     (all labs ordered are  listed, but only abnormal results are displayed) Labs Reviewed  COMPREHENSIVE METABOLIC PANEL WITH GFR - Abnormal; Notable for the following components:      Result Value   Glucose, Bld 240 (*)    BUN 36 (*)    Creatinine, Ser 2.47 (*)    Calcium 8.7 (*)    Albumin 3.1 (*)    AST 50 (*)    GFR, Estimated 28 (*)    All other components within normal limits  CBC WITH DIFFERENTIAL/PLATELET - Abnormal; Notable for the following components:   WBC 18.9 (*)    MCH 25.0 (*)    RDW 16.1 (*)    Neutro Abs 18.0 (*)    Lymphs Abs 0.2 (*)    All other components within normal limits  CK - Abnormal; Notable for the following components:   Total CK 2,755 (*)    All other components within normal limits  PROTIME-INR - Abnormal; Notable for the following components:   Prothrombin Time 18.4 (*)    INR 1.5 (*)    All other components within normal limits  CBG MONITORING, ED - Abnormal; Notable for the following components:   Glucose-Capillary 213 (*)    All other components within normal limits  I-STAT CG4 LACTIC ACID, ED - Abnormal; Notable for the following components:   Lactic Acid, Venous 5.5 (*)    All other components within normal limits  RESP PANEL BY RT-PCR (RSV, FLU A&B, COVID)  RVPGX2  CULTURE, BLOOD (ROUTINE X 2)  CULTURE, BLOOD (ROUTINE X 2)  LIPASE, BLOOD  URINALYSIS, W/ REFLEX TO CULTURE (INFECTION SUSPECTED)  I-STAT CG4 LACTIC ACID, ED  TYPE AND SCREEN    EKG: EKG Interpretation Date/Time:  Thursday January 14 2024 13:41:30 EDT Ventricular Rate:  133 PR Interval:  152 QRS Duration:  100 QT Interval:  275 QTC Calculation: 409 R Axis:   4  Text Interpretation: Sinus tachycardia Low voltage, precordial leads Consider anterior infarct Nonspecific T abnormalities, lateral leads Confirmed by Pamella Sharper [phone]) on 01/14/2024 4:14:04 PM  Radiology: CT ABDOMEN PELVIS WO CONTRAST Result Date: 01/14/2024 CLINICAL DATA:  Sepsis EXAM: CT ABDOMEN AND PELVIS WITHOUT CONTRAST  TECHNIQUE: Multidetector CT imaging of the abdomen and pelvis was performed following the standard protocol without IV contrast. RADIATION DOSE REDUCTION: This exam was performed according to the departmental dose-optimization program which includes automated exposure control, adjustment of the mA and/or kV according to patient size and/or use of iterative reconstruction technique. COMPARISON:  CT abdomen and pelvis 08/21/2023 FINDINGS: Lower chest: No acute abnormality. Hepatobiliary: No focal liver abnormality is seen. Status post cholecystectomy. No biliary  dilatation. Pancreas: Unremarkable. No pancreatic ductal dilatation or surrounding inflammatory changes. Spleen: Normal in size without focal abnormality. Adrenals/Urinary Tract: There is mild nonspecific bilateral perinephric fat stranding. There is no hydronephrosis or urinary tract calculus. The adrenal glands and bladder are within normal limits Stomach/Bowel: There is wall thickening of the cecum and ascending colon as well as terminal ileum. There are matted small bowel loops in the right lower quadrant with wall thickening and surrounding inflammation. There is dilated appendix measuring 10 cm image 6/23. There is marked mesenteric edema and interloop fluid in the right lower quadrant. This is not definitively centered surrounding the appendix. There is a questionable tiny focus of free air in the right lower quadrant adjacent to terminal ileum image 6/39. No evidence for bowel obstruction there is wall thickening of the distal esophagus. The stomach is within normal limits. Vascular/Lymphatic: Aorta and IVC are normal in size. There are atherosclerotic calcifications of the aorta. There are multiple nonenlarged retroperitoneal and central mesenteric lymph nodes. Reproductive: Prostate is unremarkable. Other: There is a small fat containing umbilical hernia. Musculoskeletal: No acute or significant osseous findings. IMPRESSION: 1. Marked inflammatory  process in the right lower quadrant with wall thickening of the cecum, ascending colon, terminal ileum and matted small bowel loops. There is marked mesenteric edema and interloop fluid. There is a questionable tiny focus of free air in the right lower quadrant adjacent to the terminal ileum. Findings are concerning for perforated viscus, possibly from the terminal ileum. 2. The appendix is dilated measuring 10 cm. Findings may be related to appendicitis or reactive change. 3. Wall thickening of the distal esophagus may represent esophagitis. 4. Aortic atherosclerosis. Aortic Atherosclerosis (ICD10-I70.0). Electronically Signed   By: Greig Pique M.D.   On: 01/14/2024 15:36   DG Chest Portable 1 View Result Date: 01/14/2024 CLINICAL DATA:  ?pna.  Unwitnessed fall.  Weakness. EXAM: PORTABLE CHEST 1 VIEW COMPARISON:  11/13/2011. FINDINGS: Please note portions of bilateral lung bases/lateral costophrenic angles is not included in the film. Low lung volume. Bilateral lung fields are clear. Bilateral costophrenic angles are clear. Normal cardio-mediastinal silhouette. No acute osseous abnormalities. The soft tissues are within normal limits. IMPRESSION: No active disease. Electronically Signed   By: Ree Molt M.D.   On: 01/14/2024 14:03     .Critical Care  Performed by: Pamella Ozell LABOR, DO Authorized by: Pamella Ozell LABOR, DO   Critical care provider statement:    Critical care time (minutes):  30   Critical care was necessary to treat or prevent imminent or life-threatening deterioration of the following conditions:  Sepsis   Critical care was time spent personally by me on the following activities:  Development of treatment plan with patient or surrogate, discussions with consultants, evaluation of patient's response to treatment, examination of patient, ordering and review of laboratory studies, ordering and review of radiographic studies, ordering and performing treatments and interventions, pulse  oximetry, re-evaluation of patient's condition and review of old charts   I assumed direction of critical care for this patient from another provider in my specialty: no      Medications Ordered in the ED  piperacillin -tazobactam (ZOSYN ) IVPB 3.375 g (3.375 g Intravenous New Bag/Given 01/14/24 1613)  vancomycin  (VANCOREADY) IVPB 2000 mg/400 mL (2,000 mg Intravenous New Bag/Given 01/14/24 1612)  sodium chloride  0.9 % bolus 1,000 mL (0 mLs Intravenous Stopped 01/14/24 1458)  ondansetron  (ZOFRAN ) injection 4 mg (4 mg Intravenous Given 01/14/24 1400)  acetaminophen  (TYLENOL ) tablet 975 mg (975 mg Oral  Given 01/14/24 1532)  metoCLOPramide  (REGLAN ) injection 10 mg (10 mg Intravenous Given 01/14/24 1600)  pantoprazole  (PROTONIX ) injection 40 mg (40 mg Intravenous Given 01/14/24 1600)  morphine  (PF) 4 MG/ML injection 4 mg (4 mg Intravenous Given 01/14/24 1600)    Clinical Course as of 01/14/24 1614  Thu Jan 14, 2024  1520 Initial laboratory workup notable for leukocytosis, AKI elevation venous lactic.  Blood pressure remains hypertensive.  LILLETTE Ozell Marine DO, am transitioning care of this patient to the oncoming provider pending results of CT abdomen pelvis, reevaluation and admission [MP]    Clinical Course User Index [MP] Marine Ozell LABOR, DO                                 Medical Decision Making 63 year old male with history as above presenting after being found down on bathroom floor overnight.  Clemens out of wheelchair around 2200.  Covered in dark vomit.  Initial vital signs notable for tachycardia tachypnea.  Hypertensive.  No acute traumatic findings on my exam.  Exam most concerning for acute infectious process versus rhabdomyolysis.  Will obtain laboratory workup including cultures lactic acid, CK, UA and chest x-ray.  Will cover with broad-spectrum antibiotics vancomycin  and Zosyn  considering his risk factors.  Will provide IV fluids for rehydration.  Plan for admission  Amount and/or  Complexity of Data Reviewed Labs: ordered. Radiology: ordered.  Risk OTC drugs. Prescription drug management. Decision regarding hospitalization.        Final diagnoses:  Sepsis with acute renal failure without septic shock, due to unspecified organism, unspecified acute renal failure type (HCC)  Colitis  AKI (acute kidney injury) [facility])    ED Discharge Orders     None          Marine Ozell LABOR, DO 01/14/24 1614

## 2024-01-14 NOTE — Consult Note (Signed)
error  ROS:ROS.

## 2024-01-14 NOTE — ED Triage Notes (Signed)
 [facility] reports pt coming from home. Neighbors found pt prone on the floor. Pt states he slipped out of his wheelchair onto the bathroom floor. Pt states he was too weak to get out of the floor so he was there all night since about 2200. Pt states he is on a blood thinner but does not recall which one. Pt did vomit with EMS.

## 2024-01-14 NOTE — ED Provider Notes (Signed)
 Assumed care from Dr. Pamella at 330.  Patient coming in after falling out of his wheelchair and lying in the floor overnight.  Upon arrival here patient has diffuse lower abdominal pain, fever and concerns for sepsis.  Lactic acid and white count are elevated with AKI with a creatinine of 2.47 today.  Patient has been covered broadly with antibiotics.  Patient was waiting on imaging. I have independently visualized and interpreted pt's images today. CT in the abdomen of the pelvis has returned and findings show marked inflammatory process in the right lower quadrant.  Radiology reports that the appendix is dilated and measures 10 cm concerning for possible appendicitis or reactive changes as there is also thickening of the cecum ascending colon terminal ileum and matted small bowel loops with marked mesenteric edema and interloop fluid with a questionable tiny focus of free air.  Will discuss the case with general surgery.  Patient will need admitted for sepsis as well. Hospitalist called for admission.   Doretha Folks, MD 01/14/24 [phone]

## 2024-01-14 NOTE — H&P (Addendum)
 Date: 01/14/2024               Patient Name:  Tiffany Talarico MRN: [id number]  DOB: August 04, 1960 Age / Sex: 63 y.o., male   PCP: Shelda Atlas, MD         Medical Service: Internal Medicine Teaching Service         Attending Physician: Dr. Reyes Fenton      First Contact: Armando Rossetti, MD Chart   Second Contact: Dr. Libby Blanch, DO          Pager Information: First Contact Pager: [phone]   Second Contact Pager: [phone]   SUBJECTIVE   Chief Complaint: Fall, Abdominal pain  History of Present Illness: Jordi Lacko is a 63 y.o. person living with a history of DM2 who is wheel chair bound due to L BKA, previous R transmet, HTN, he states that he began to have abd pain yesterday Presents with diffuse lower abdominal pain after fall from wheelchair; was down overnight at home. Endorses abdominal pain, fever, and nausea.   Denies vomiting (despite EMS note of bloody emesis), diarrhea, chest pain, or shortness of breath. Unable to elicit more history due to altered mental status.  Rest of history obtained from chart review.  Please see ED course.   ED Course:  Patient brought to ED by EMS after being found on the floor following a fall from his wheelchair, reportedly down overnight. On arrival, he was febrile with diffuse lower abdominal pain and signs concerning for sepsis. Initial labs notable for elevated lactic acid, leukocytosis, and acute kidney injury (Cr 2.47).  CT abdomen/pelvis obtained and reviewed independently: significant inflammatory changes in the right lower quadrant. Appendix is dilated (10 cm), concerning for possible appendicitis vs. reactive changes. Also noted: thickening of the cecum, ascending colon, and terminal ileum; matted small bowel loops; marked mesenteric edema; interloop fluid; and a questionable tiny focus of free air, raising concern for early perforation or phlegmon/abscess.  Patient received an initial IV fluid bolus and is now on maintenance  fluids. Sepsis code activated. Broad-spectrum antibiotics initiated including vancomycin  and Zosyn .  General surgery consulted for further evaluation and management. Plan to admit for sepsis of abdominal origin and close monitoring.  Meds:   Vitamin C 1000 mg daily Aspirin  81 mg daily Vitamin D  50 mcg daily (2000 international units ) daily  Colace 100 mg daily Lantus  20 units nightly Lisinopril  5 mg daily Oxycodone  5 mg every 4 hours as needed Simvastatin  10 mg daily Flomax  0.4 mg daily Zinc  sulfate 220 mg daily Acidophilus capsile    Patient reported:   Current Facility-Administered Medications for the 01/14/24 encounter [facility] Encounter)  Medication   acidophilus (RISAQUAD) capsule 1 capsule   No outpatient medications have been marked as taking for the 01/14/24 encounter [facility] Encounter).    Past Medical History  - Hypertension  - Type 2 Diabetes Mellitus  - Benign Prostatic Hyperplasia  - Abscess of dorsum of left ankle - Osteomyelitis of toe, status post left BKA   Past Surgical History Past Surgical History:  Procedure Laterality Date   AMPUTATION Left 2012   Left Great Toe   AMPUTATION Right 04/20/2015   Procedure: Right Great Toe Amputation;  Surgeon: Jerona Harden GAILS, MD;  Location: [facility] OR;  Service: Orthopedics;  Laterality: Right;   AMPUTATION Right 07/27/2015   Procedure: Right 2nd Toe Amputation;  Surgeon: Jerona Harden GAILS, MD;  Location: [facility] OR;  Service: Orthopedics;  Laterality: Right;   AMPUTATION Right 09/24/2016  Procedure: Right 5th Toe Amputation;  Surgeon: Jerona Harden GAILS, MD;  Location: [facility] OR;  Service: Orthopedics;  Laterality: Right;   AMPUTATION Right 01/07/2017   Procedure: Right Foot 3rd and 4th Toe Amputation;  Surgeon: Harden Jerona GAILS, MD;  Location: [facility] OR;  Service: Orthopedics;  Laterality: Right;   AMPUTATION Left 11/30/2020   Procedure: LEFT BELOW KNEE AMPUTATION;  Surgeon: Harden Jerona GAILS, MD;  Location: [facility] OR;  Service: Orthopedics;   Laterality: Left;   CHOLECYSTECTOMY  03/2011   EYE SURGERY     lazer   foot surgery  11/2007   small toe on left foot removed.   NASAL SEPTUM SURGERY  1981   Due to broken nose. Surgery was to repair how bone healed.   PARS PLANA VITRECTOMY  11/17/2011   Procedure: PARS PLANA VITRECTOMY WITH 23 GAUGE;  Surgeon: Jestine Bunnell, MD;  Location: [facility] OR;  Service: Ophthalmology;  Laterality: Right;  With Endolaser   TOE AMPUTATION Left    5 th   TONSILLECTOMY     TRANSURETHRAL RESECTION OF PROSTATE N/A 04/28/2022   Procedure: BIPOLAR TRANSURETHRAL RESECTION OF THE PROSTATE (TURP);  Surgeon: Selma Donnice SAUNDERS, MD;  Location: [facility] ORS;  Service: Urology;  Laterality: N/A;  90 MINUTES NEEDED FOR CASE     Social: Thank you SOCIAL HISTORY: Marital Status: Unknown Ethnicity: Not Hispanic Or Latino; Race: Black or African American Current Smoking Status: ????? Pt was not cooperative, could not take HX Has never drank.  Does not drink caffeine.   PCP:  Shelda Atlas, MD   Family History:  Family History  Problem Relation Age of Onset   Cancer Mother        breast     Allergies: Allergies as of 01/14/2024   (No Known Allergies)    Review of Systems: A complete ROS was negative except as per HPI.   OBJECTIVE:   Physical Exam: Blood pressure 113/67, pulse (!) 124, temperature (S) (!) 101.8 F (38.8 C), temperature source (S) Axillary, resp. rate 14, height 5' 9 (1.753 m), weight 110.7 kg, SpO2 96%. General appearance: fatigued and mild distress, ill-appearing, some dried blood noted on the lip Eyes: negative findings: conjunctivae and sclerae normal and pupils equal, round, reactive to light and accomodation Throat: lips, mucosa, and tongue normal; teeth and gums normal Neck: no adenopathy and supple, symmetrical, trachea midline Resp: clear to auscultation bilaterally Cardio: Tachycardic rate, regular rhythm Extremities: edema none. Abdomen: Soft without chair bound due to L BKA, previous R transmet, HTN, he states that he began to have abd pain yesterday Presents with diffuse lower abdominal pain after fall from wheelchair; was down overnight at home. Endorses abdominal pain, fever, and nausea.   Denies vomiting (despite EMS note of bloody emesis), diarrhea, chest pain, or shortness of breath. Unable to elicit more history due to altered mental status.  Rest of history obtained from chart review.  Please see ED course.   ED Course:  Patient brought to ED by EMS after being found on the floor following a fall from his wheelchair, reportedly down overnight. On arrival, he was febrile with diffuse lower abdominal pain and signs concerning for sepsis. Initial labs notable for elevated lactic acid, leukocytosis, and acute kidney injury (Cr 2.47).  CT abdomen/pelvis obtained and reviewed independently: significant inflammatory changes in the right lower quadrant. Appendix is dilated (10 cm), concerning for possible appendicitis vs. reactive changes. Also noted: thickening of the cecum, ascending colon, and terminal ileum; matted small bowel loops; marked mesenteric edema; interloop fluid; and a questionable tiny focus of free air, raising concern for early perforation or phlegmon/abscess.  Patient received an initial IV fluid bolus and is now on maintenance  fluids. Sepsis code activated. Broad-spectrum antibiotics initiated including vancomycin  and Zosyn .  General surgery consulted for further evaluation and management. Plan to admit for sepsis of abdominal origin and close monitoring.  Meds:   Vitamin C 1000 mg daily Aspirin  81 mg daily Vitamin D  50 mcg daily (2000 international units ) daily  Colace 100 mg daily Lantus  20 units nightly Lisinopril  5 mg daily Oxycodone  5 mg every 4 hours as needed Simvastatin  10 mg daily Flomax  0.4 mg daily Zinc  sulfate 220 mg daily Acidophilus capsile    Patient reported:   Current Facility-Administered Medications for the 01/14/24 encounter [facility] Encounter)  Medication   acidophilus (RISAQUAD) capsule 1 capsule   No outpatient medications have been marked as taking for the 01/14/24 encounter [facility] Encounter).    Past Medical History  - Hypertension  - Type 2 Diabetes Mellitus  - Benign Prostatic Hyperplasia  - Abscess of dorsum of left ankle - Osteomyelitis of toe, status post left BKA   Past Surgical History Past Surgical History:  Procedure Laterality Date   AMPUTATION Left 2012   Left Great Toe   AMPUTATION Right 04/20/2015   Procedure: Right Great Toe Amputation;  Surgeon: Jerona Harden GAILS, MD;  Location: [facility] OR;  Service: Orthopedics;  Laterality: Right;   AMPUTATION Right 07/27/2015   Procedure: Right 2nd Toe Amputation;  Surgeon: Jerona Harden GAILS, MD;  Location: [facility] OR;  Service: Orthopedics;  Laterality: Right;   AMPUTATION Right 09/24/2016  Procedure: Right 5th Toe Amputation;  Surgeon: Jerona Harden GAILS, MD;  Location: [facility] OR;  Service: Orthopedics;  Laterality: Right;   AMPUTATION Right 01/07/2017   Procedure: Right Foot 3rd and 4th Toe Amputation;  Surgeon: Harden Jerona GAILS, MD;  Location: [facility] OR;  Service: Orthopedics;  Laterality: Right;   AMPUTATION Left 11/30/2020   Procedure: LEFT BELOW KNEE AMPUTATION;  Surgeon: Harden Jerona GAILS, MD;  Location: [facility] OR;  Service: Orthopedics;   Laterality: Left;   CHOLECYSTECTOMY  03/2011   EYE SURGERY     lazer   foot surgery  11/2007   small toe on left foot removed.   NASAL SEPTUM SURGERY  1981   Due to broken nose. Surgery was to repair how bone healed.   PARS PLANA VITRECTOMY  11/17/2011   Procedure: PARS PLANA VITRECTOMY WITH 23 GAUGE;  Surgeon: Jestine Bunnell, MD;  Location: [facility] OR;  Service: Ophthalmology;  Laterality: Right;  With Endolaser   TOE AMPUTATION Left    5 th   TONSILLECTOMY     TRANSURETHRAL RESECTION OF PROSTATE N/A 04/28/2022   Procedure: BIPOLAR TRANSURETHRAL RESECTION OF THE PROSTATE (TURP);  Surgeon: Selma Donnice SAUNDERS, MD;  Location: [facility] ORS;  Service: Urology;  Laterality: N/A;  90 MINUTES NEEDED FOR CASE     Social: Thank you SOCIAL HISTORY: Marital Status: Unknown Ethnicity: Not Hispanic Or Latino; Race: Black or African American Current Smoking Status: ????? Pt was not cooperative, could not take HX Has never drank.  Does not drink caffeine.   PCP:  Shelda Atlas, MD   Family History:  Family History  Problem Relation Age of Onset   Cancer Mother        breast     Allergies: Allergies as of 01/14/2024   (No Known Allergies)    Review of Systems: A complete ROS was negative except as per HPI.   OBJECTIVE:   Physical Exam: Blood pressure 113/67, pulse (!) 124, temperature (S) (!) 101.8 F (38.8 C), temperature source (S) Axillary, resp. rate 14, height 5' 9 (1.753 m), weight 110.7 kg, SpO2 96%. General appearance: fatigued and mild distress, ill-appearing, some dried blood noted on the lip Eyes: negative findings: conjunctivae and sclerae normal and pupils equal, round, reactive to light and accomodation Throat: lips, mucosa, and tongue normal; teeth and gums normal Neck: no adenopathy and supple, symmetrical, trachea midline Resp: clear to auscultation bilaterally Cardio: Tachycardic rate, regular rhythm Extremities: edema none. Abdomen: Soft without mass or ascites, hypoactive  bowel sounds, diffuse abdominal tenderness significantly in right upper quadrant  Labs: CBC    Component Value Date/Time   WBC 18.9 (H) 01/14/2024 1342   RBC 5.39 01/14/2024 1342   HGB 13.5 01/14/2024 1342   HCT 44.0 01/14/2024 1342   PLT 296 01/14/2024 1342   MCV 81.6 01/14/2024 1342   MCH 25.0 (L) 01/14/2024 1342   MCHC 30.7 01/14/2024 1342   RDW 16.1 (H) 01/14/2024 1342   LYMPHSABS 0.2 (L) 01/14/2024 1342   MONOABS 0.8 01/14/2024 1342   EOSABS 0.0 01/14/2024 1342   BASOSABS 0.0 01/14/2024 1342     CMP     Component Value Date/Time   NA 140 01/14/2024 1342   K 3.5 01/14/2024 1342   CL 102 01/14/2024 1342   CO2 23 01/14/2024 1342   GLUCOSE 240 (H) 01/14/2024 1342   BUN 36 (H) 01/14/2024 1342   CREATININE 2.47 (H) 01/14/2024 1342   CREATININE 1.77 (H) 02/27/2022 0000   CALCIUM 8.7 (L) 01/14/2024 1342  PROT 7.4 01/14/2024 1342   ALBUMIN 3.1 (L) 01/14/2024 1342   AST 50 (H) 01/14/2024 1342   ALT 21 01/14/2024 1342   ALKPHOS 63 01/14/2024 1342   BILITOT 0.7 01/14/2024 1342   GFRNONAA 28 (L) 01/14/2024 1342   GFRAA >60 01/07/2017 0915    Imaging:  CT ABDOMEN PELVIS WO CONTRAST Result Date: 01/14/2024 CLINICAL DATA:  Sepsis EXAM: CT ABDOMEN AND PELVIS WITHOUT CONTRAST TECHNIQUE: Multidetector CT imaging of the abdomen and pelvis was performed following the standard protocol without IV contrast. RADIATION DOSE REDUCTION: This exam was performed according to the departmental dose-optimization program which includes automated exposure control, adjustment of the mA and/or kV according to patient size and/or use of iterative reconstruction technique. COMPARISON:  CT abdomen and pelvis 08/21/2023 FINDINGS: Lower chest: No acute abnormality. Hepatobiliary: No focal liver abnormality is seen. Status post cholecystectomy. No biliary dilatation. Pancreas: Unremarkable. No pancreatic ductal dilatation or surrounding inflammatory changes. Spleen: Normal in size without focal  abnormality. Adrenals/Urinary Tract: There is mild nonspecific bilateral perinephric fat stranding. There is no hydronephrosis or urinary tract calculus. The adrenal glands and bladder are within normal limits Stomach/Bowel: There is wall thickening of the cecum and ascending colon as well as terminal ileum. There are matted small bowel loops in the right lower quadrant with wall thickening and surrounding inflammation. There is dilated appendix measuring 10 cm image 6/23. There is marked mesenteric edema and interloop fluid in the right lower quadrant. This is not definitively centered surrounding the appendix. There is a questionable tiny focus of free air in the right lower quadrant adjacent to terminal ileum image 6/39. No evidence for bowel obstruction there is wall thickening of the distal esophagus. The stomach is within normal limits. Vascular/Lymphatic: Aorta and IVC are normal in size. There are atherosclerotic calcifications of the aorta. There are multiple nonenlarged retroperitoneal and central mesenteric lymph nodes. Reproductive: Prostate is unremarkable. Other: There is a small fat containing umbilical hernia. Musculoskeletal: No acute or significant osseous findings. IMPRESSION: 1. Marked inflammatory process in the right lower quadrant with wall thickening of the cecum, ascending colon, terminal ileum and matted small bowel loops. There is marked mesenteric edema and interloop fluid. There is a questionable tiny focus of free air in the right lower quadrant adjacent to the terminal ileum. Findings are concerning for perforated viscus, possibly from the terminal ileum. 2. The appendix is dilated measuring 10 cm. Findings may be related to appendicitis or reactive change. 3. Wall thickening of the distal esophagus may represent esophagitis. 4. Aortic atherosclerosis. Aortic Atherosclerosis (ICD10-I70.0). Electronically Signed   By: Greig Pique M.D.   On: 01/14/2024 15:36   DG Chest Portable 1  View Result Date: 01/14/2024 CLINICAL DATA:  ?pna.  Unwitnessed fall.  Weakness. EXAM: PORTABLE CHEST 1 VIEW COMPARISON:  11/13/2011. FINDINGS: Please note portions of bilateral lung bases/lateral costophrenic angles is not included in the film. Low lung volume. Bilateral lung fields are clear. Bilateral costophrenic angles are clear. Normal cardio-mediastinal silhouette. No acute osseous abnormalities. The soft tissues are within normal limits. IMPRESSION: No active disease. Electronically Signed   By: Ree Molt M.D.   On: 01/14/2024 14:03      ASSESSMENT & PLAN:   Assessment & Plan by Problem: Principal Problem:   Sepsis (HCC) Active Problems:   HTN (hypertension)   AKI (acute kidney injury) (HCC)   Acute appendicitis   Type 2 diabetes mellitus with circulatory disorder (HCC)   Hx of left BKA (HCC)   Lactic  acidosis   Shermar Friedland is a 63 y.o. person living with a history of DM2 who is wheel chair bound due to L BKA, previous R transmet, HTN, he states that he began to have abd pain yesterday Presents with diffuse lower abdominal pain after fall from wheelchair; was down overnight at home. Endorses abdominal pain, fever, and nausea.   Denies vomiting (despite EMS note of bloody emesis), diarrhea, chest pain, or shortness of breath. Unable to elicit more history due to altered mental status.  Rest of history obtained from chart review.  Please see ED course.   ED Course:  Patient brought to ED by EMS after being found on the floor following a fall from his wheelchair, reportedly down overnight. On arrival, he was febrile with diffuse lower abdominal pain and signs concerning for sepsis. Initial labs notable for elevated lactic acid, leukocytosis, and acute kidney injury (Cr 2.47).  CT abdomen/pelvis obtained and reviewed independently: significant inflammatory changes in the right lower quadrant. Appendix is dilated (10 cm), concerning for possible appendicitis vs. reactive changes. Also noted: thickening of the cecum, ascending colon, and terminal ileum; matted small bowel loops; marked mesenteric edema; interloop fluid; and a questionable tiny focus of free air, raising concern for early perforation or phlegmon/abscess.  Patient received an initial IV fluid bolus and is now on maintenance  fluids. Sepsis code activated. Broad-spectrum antibiotics initiated including vancomycin  and Zosyn .  General surgery consulted for further evaluation and management. Plan to admit for sepsis of abdominal origin and close monitoring.  Meds:   Vitamin C 1000 mg daily Aspirin  81 mg daily Vitamin D  50 mcg daily (2000 international units ) daily  Colace 100 mg daily Lantus  20 units nightly Lisinopril  5 mg daily Oxycodone  5 mg every 4 hours as needed Simvastatin  10 mg daily Flomax  0.4 mg daily Zinc  sulfate 220 mg daily Acidophilus capsile    Patient reported:   Current Facility-Administered Medications for the 01/14/24 encounter [facility] Encounter)  Medication   acidophilus (RISAQUAD) capsule 1 capsule   No outpatient medications have been marked as taking for the 01/14/24 encounter [facility] Encounter).    Past Medical History  - Hypertension  - Type 2 Diabetes Mellitus  - Benign Prostatic Hyperplasia  - Abscess of dorsum of left ankle - Osteomyelitis of toe, status post left BKA   Past Surgical History Past Surgical History:  Procedure Laterality Date   AMPUTATION Left 2012   Left Great Toe   AMPUTATION Right 04/20/2015   Procedure: Right Great Toe Amputation;  Surgeon: Jerona Harden GAILS, MD;  Location: [facility] OR;  Service: Orthopedics;  Laterality: Right;   AMPUTATION Right 07/27/2015   Procedure: Right 2nd Toe Amputation;  Surgeon: Jerona Harden GAILS, MD;  Location: [facility] OR;  Service: Orthopedics;  Laterality: Right;   AMPUTATION Right 09/24/2016  Procedure: Right 5th Toe Amputation;  Surgeon: Jerona Harden GAILS, MD;  Location: [facility] OR;  Service: Orthopedics;  Laterality: Right;   AMPUTATION Right 01/07/2017   Procedure: Right Foot 3rd and 4th Toe Amputation;  Surgeon: Harden Jerona GAILS, MD;  Location: [facility] OR;  Service: Orthopedics;  Laterality: Right;   AMPUTATION Left 11/30/2020   Procedure: LEFT BELOW KNEE AMPUTATION;  Surgeon: Harden Jerona GAILS, MD;  Location: [facility] OR;  Service: Orthopedics;   Laterality: Left;   CHOLECYSTECTOMY  03/2011   EYE SURGERY     lazer   foot surgery  11/2007   small toe on left foot removed.   NASAL SEPTUM SURGERY  1981   Due to broken nose. Surgery was to repair how bone healed.   PARS PLANA VITRECTOMY  11/17/2011   Procedure: PARS PLANA VITRECTOMY WITH 23 GAUGE;  Surgeon: Jestine Bunnell, MD;  Location: [facility] OR;  Service: Ophthalmology;  Laterality: Right;  With Endolaser   TOE AMPUTATION Left    5 th   TONSILLECTOMY     TRANSURETHRAL RESECTION OF PROSTATE N/A 04/28/2022   Procedure: BIPOLAR TRANSURETHRAL RESECTION OF THE PROSTATE (TURP);  Surgeon: Selma Donnice SAUNDERS, MD;  Location: [facility] ORS;  Service: Urology;  Laterality: N/A;  90 MINUTES NEEDED FOR CASE     Social: Thank you SOCIAL HISTORY: Marital Status: Unknown Ethnicity: Not Hispanic Or Latino; Race: Black or African American Current Smoking Status: ????? Pt was not cooperative, could not take HX Has never drank.  Does not drink caffeine.   PCP:  Shelda Atlas, MD   Family History:  Family History  Problem Relation Age of Onset   Cancer Mother        breast     Allergies: Allergies as of 01/14/2024   (No Known Allergies)    Review of Systems: A complete ROS was negative except as per HPI.   OBJECTIVE:   Physical Exam: Blood pressure 113/67, pulse (!) 124, temperature (S) (!) 101.8 F (38.8 C), temperature source (S) Axillary, resp. rate 14, height 5' 9 (1.753 m), weight 110.7 kg, SpO2 96%. General appearance: fatigued and mild distress, ill-appearing, some dried blood noted on the lip Eyes: negative findings: conjunctivae and sclerae normal and pupils equal, round, reactive to light and accomodation Throat: lips, mucosa, and tongue normal; teeth and gums normal Neck: no adenopathy and supple, symmetrical, trachea midline Resp: clear to auscultation bilaterally Cardio: Tachycardic rate, regular rhythm Extremities: edema none. Abdomen: Soft without chair bound due to L BKA, previous R transmet, HTN, he states that he began to have abd pain yesterday. He denies n/v. He fell from his wheel chair at home and is unclear how long he was down (hours). Paramedics came, found him covered in dark emesis, and took him to the hospital. He is unclear how they were notified,  admitted for sepsis and abdominal pain on hospital day 0  #Sepsis #Suspected acute appendicitis #Lactic acidosis Patient with fever (T 101.8), tachycardia (HR 122), leukocytosis (WBC 18.9), and abdominal source concerning for sepsis. Likely secondary to intra-abdominal infection (possible complicated appendicitis). He reports that his abdominal pain began yesterday and also endorsed nausea without emesis, although prehospital notes indicate he was found covered in bloody vomit. . On arrival, he had diffuse lower abdominal pain, fever, and signs concerning for sepsis. Labs revealed elevated lactic acid, leukocytosis, and acute kidney injury (creatinine 2.47). Imaging shows a dilated  appendix (10 cm), with findings concerning for appendicitis or reactive changes, including thickening of the cecum, ascending colon, and terminal ileum, as well as mesenteric edema, matted small bowel loops, interloop fluid, and possible tiny focus of free air. His CT scan was concerning for perforated viscus near the ileum, esophagitis, and appendicitis/dilated appendix (10cm).  -Discontinue Zosyn ,  -Start metronidazole , ceftriaxone , and vancomycin  day 1 -Blood cultures  -General surgery consult for suspected intra-abdominal source -Monitor vitals, WBC, lactate, renal functions -Monitor vital signs, fluid status, and urine output closely -Trend lactic acid, CBC, BMP -Monitor abdominal exam for worsening or signs of perforation -Discuss case with general surgery regarding possible appendicitis and need for intervention -Maintain NPO status pending surgical evaluation  #Acute kidney injury (AKI)  Likely multifactorial: sepsis-related hypoperfusion, possible dehydration, or rhabdomyolysis. - lactated ringers  bolus 1,000 mL - lactated ringers  infusion at 125 mL/hr, Intravenous, Continuous   #Rhabdomyolysis Rhabdomyolysis likely due to prolonged immobility. CK elevated at 1755. Electrolytes stable. -Continue IV fluids, lactated ringers  infusion at 125 mL/hr, Intravenous, Continuous -Monitor CK, renal function, electrolytes, and urine output  #Fall:  Pt states he slipped out of his wheelchair onto the bathroom floor. Pt states he was too weak to get out of the floor so he was there all night since about 2200. Pt states he is on a blood thinner but does not recall which one. Pt did vomit with EMS. EMS noted him to be covered in vomit blood which was dark in color. overnight. EMS noted him to be covered in vomit blood which was dark in color. Patient states he was in his normal state of health last night prior to the fall. Denies any traumatic injury. No head trauma loss of consciousness   Mechanical fall with possible traumatic injury ,concern for occult trauma - Morphine  4 mg, Intravenous, Once, On Thu 01/14/24 at 1600, For 1 dose  -PT/OT  #Hypertension Blood pressures elevated currently.  Will fluid resuscitate patient.  Will hold off on home antihypertensive with concern for sepsis. - Monitor blood pressure closely  #Diabetes Patient with type 2 diabetes, glucose elevated at 213.  Last A1c over 1 year ago 5.8. -Hold home Lantus  20 units nightly - Start sliding scale insulin  - Monitor blood glucose levels closely  #Hematemesis Patient did have some concern for hematemesis.  On my exam, did have some dried blood on his mouth.  Will continue to monitor for further hematemesis. - Monitor clinically  Best practice: Diet: NPO VTE: Heparin  IVF: LR,100cc/hr Code: Full  Disposition planning: Prior to Admission Living Arrangement: Home, living alone Anticipated Discharge Location: Home  Dispo: Admit patient to Inpatient with expected length of stay greater than 2 midnights.  Signed: Jeorgia Helming, MD Internal Medicine Resident  01/14/2024, 6:42 PM  Please contact IM Residency On-Call Pager at: [phone] or [phone].

## 2024-01-14 NOTE — ED Notes (Signed)
 Sepsis timer started on accident

## 2024-01-14 NOTE — ED Notes (Signed)
 Patient transported to CT

## 2024-01-14 NOTE — Sepsis Progress Note (Signed)
 Notified provider of need to order repeat lactic acid for 1700.

## 2024-01-14 NOTE — Consult Note (Signed)
 [name] 08-26-1960  [id number].    Requesting MD: Dr. Benton Shone Chief Complaint/Reason for Consult: R sided abdominal pain, fever, elevated lactic  HPI: [name] is a 63 y.o. male with a history of hypertension and diabetes status post lower extremity amputations who is wheelchair-bound who presented after being found down.  Patient reports yesterday he began having diffuse right sided abdominal pain with associated vomiting (without nausea) and diarrhea.  He reports he slid out of his wheelchair last night and was unable to get up.  He was found by EMS today covered in bloody vomit per notes.  Patient just received pain medication and has to be woken frequently during the visit to obtain history.  He reports that he still has diffuse right sided abdominal pain.  No current nausea.  Last bowel movement was yesterday and nonbloody.  He reports this was a normal bowel movement initially but then reported this was diarrhea.  He denies prior colonoscopy.  He denies prior abdominal surgery but has cholecystectomy listed on his chart review which also correlates with imaging.  He denies any blood thinner use but has 81 mg aspirin  listed on chart review.  He denies personal or family history of IBD.  ROS: ROS As above, see hpi  Family History  Problem Relation Age of Onset   Cancer Mother        breast    Past Medical History:  Diagnosis Date   Blood transfusion 2009   Diabetes mellitus    type 2   Hyperlipemia    Hypertension    denies   Leg swelling    Osteomyelitis (HCC)    right 5th toe   Visual disturbance     Past Surgical History:  Procedure Laterality Date   AMPUTATION Left 2012   Left Great Toe   AMPUTATION Right 04/20/2015   Procedure: Right Great Toe Amputation;  Surgeon: Jerona Harden GAILS, MD;  Location: [facility] OR;  Service: Orthopedics;  Laterality: Right;   AMPUTATION Right 07/27/2015   Procedure: Right 2nd Toe Amputation;  Surgeon: Jerona Harden GAILS, MD;   Location: [facility] OR;  Service: Orthopedics;  Laterality: Right;   AMPUTATION Right 09/24/2016   Procedure: Right 5th Toe Amputation;  Surgeon: Jerona Harden GAILS, MD;  Location: [facility] OR;  Service: Orthopedics;  Laterality: Right;   AMPUTATION Right 01/07/2017   Procedure: Right Foot 3rd and 4th Toe Amputation;  Surgeon: Harden Jerona GAILS, MD;  Location: [facility] OR;  Service: Orthopedics;  Laterality: Right;   AMPUTATION Left 11/30/2020   Procedure: LEFT BELOW KNEE AMPUTATION;  Surgeon: Harden Jerona GAILS, MD;  Location: [facility] OR;  Service: Orthopedics;  Laterality: Left;   CHOLECYSTECTOMY  03/2011   EYE SURGERY     lazer   foot surgery  11/2007   small toe on left foot removed.   NASAL SEPTUM SURGERY  1981   Due to broken nose. Surgery was to repair how bone healed.   PARS PLANA VITRECTOMY  11/17/2011   Procedure: PARS PLANA VITRECTOMY WITH 23 GAUGE;  Surgeon: Jestine Bunnell, MD;  Location: [facility] OR;  Service: Ophthalmology;  Laterality: Right;  With Endolaser   TOE AMPUTATION Left    5 th   TONSILLECTOMY     TRANSURETHRAL RESECTION OF PROSTATE N/A 04/28/2022   Procedure: BIPOLAR TRANSURETHRAL RESECTION OF THE PROSTATE (TURP);  Surgeon: Selma Donnice SAUNDERS, MD;  Location: [facility] ORS;  Service: Urology;  Laterality: N/A;  90 MINUTES NEEDED FOR CASE    Social  History:  reports that he quit smoking about 44 years ago. His smoking use included cigarettes. He started smoking about 49 years ago. He has never used smokeless tobacco. He reports current alcohol  use. He reports that he does not currently use drugs.  Allergies: No Known Allergies  (Not in a hospital admission)    Physical Exam: Blood pressure (!) 142/71, pulse (!) 126, temperature (S) (!) 101.8 F (38.8 C), temperature source (S) Axillary, resp. rate 20, height 5' 9 (1.753 m), weight 110.7 kg, SpO2 98%. General: pleasant, WD/WN male who is laying in bed in NAD HEENT: head is normocephalic, atraumatic.  Sclera are non-icteric.  Heart: Tachycardic Lungs: Respiratory  effort nonlabored Abd: On exam patient has mild tenderness to palpation in the RLQ with more significant tender in the RUQ with grimacing and at least voluntary guarding intermittently. Patient just received morphine  prior to my arrival and was very lethargic, requiring me to wake him up multiple times during history and exam. There were several times when palpating the RUQ that he did not show signs of involuntary guarding.  The remainder of his abdomen was soft, without tenderness to palpation and there was no evidence of rigidity.  MS: no BUE or BLE edema Psych: A&Ox4    Results for orders placed or performed during the hospital encounter of 01/14/24 (from the past 48 hours)  Resp panel by RT-PCR (RSV, Flu A&B, Covid) Anterior Nasal Swab     Status: None   Collection Time: 01/14/24  1:33 PM   Specimen: Anterior Nasal Swab  Result Value Ref Range   SARS Coronavirus 2 by RT PCR NEGATIVE NEGATIVE   Influenza A by PCR NEGATIVE NEGATIVE   Influenza B by PCR NEGATIVE NEGATIVE    Comment: (NOTE) The Xpert Xpress SARS-CoV-2/FLU/RSV plus assay is intended as an aid in the diagnosis of influenza from Nasopharyngeal swab specimens and should not be used as a sole basis for treatment. Nasal washings and aspirates are unacceptable for Xpert Xpress SARS-CoV-2/FLU/RSV testing.  Fact Sheet for Patients: BloggerCourse.com  Fact Sheet for Healthcare Providers: SeriousBroker.it  This test is not yet approved or cleared by the [place]  FDA and has been authorized for detection and/or diagnosis of SARS-CoV-2 by FDA under an Emergency Use Authorization (EUA). This EUA will remain in effect (meaning this test can be used) for the duration of the COVID-19 declaration under Section 564(b)(1) of the Act, 21 U.S.C. section 360bbb-3(b)(1), unless the authorization is terminated or revoked.     Resp Syncytial Virus by PCR NEGATIVE NEGATIVE    Comment:  (NOTE) Fact Sheet for Patients: BloggerCourse.com  Fact Sheet for Healthcare Providers: SeriousBroker.it  This test is not yet approved or cleared by the [place]  FDA and has been authorized for detection and/or diagnosis of SARS-CoV-2 by FDA under an Emergency Use Authorization (EUA). This EUA will remain in effect (meaning this test can be used) for the duration of the COVID-19 declaration under Section 564(b)(1) of the Act, 21 U.S.C. section 360bbb-3(b)(1), unless the authorization is terminated or revoked.  Performed at [facility], [address], [place], [place] [address]   CBG monitoring, ED     Status: Abnormal   Collection Time: 01/14/24  1:38 PM  Result Value Ref Range   Glucose-Capillary 213 (H) 70 - 99 mg/dL    Comment: Glucose reference range applies only to samples taken after fasting for at least 8 hours.  Comprehensive metabolic panel     Status: Abnormal  Collection Time: 01/14/24  1:42 PM  Result Value Ref Range   Sodium 140 135 - 145 mmol/L   Potassium 3.5 3.5 - 5.1 mmol/L   Chloride 102 98 - 111 mmol/L   CO2 23 22 - 32 mmol/L   Glucose, Bld 240 (H) 70 - 99 mg/dL    Comment: Glucose reference range applies only to samples taken after fasting for at least 8 hours.   BUN 36 (H) 8 - 23 mg/dL   Creatinine, Ser 7.52 (H) 0.61 - 1.24 mg/dL   Calcium 8.7 (L) 8.9 - 10.3 mg/dL   Total Protein 7.4 6.5 - 8.1 g/dL   Albumin 3.1 (L) 3.5 - 5.0 g/dL   AST 50 (H) 15 - 41 U/L   ALT 21 0 - 44 U/L   Alkaline Phosphatase 63 38 - 126 U/L   Total Bilirubin 0.7 0.0 - 1.2 mg/dL   GFR, Estimated 28 (L) >60 mL/min    Comment: (NOTE) Calculated using the CKD-EPI Creatinine Equation (2021)    Anion gap 15 5 - 15    Comment: Performed at [facility], [address] [place], [place] [address]  Lipase, blood     Status: None   Collection Time: 01/14/24  1:42 PM  Result Value Ref Range   Lipase 31 11 - 51 U/L     Comment: Performed at [facility], [address] [place], [place] [address]  CBC with Differential     Status: Abnormal   Collection Time: 01/14/24  1:42 PM  Result Value Ref Range   WBC 18.9 (H) 4.0 - 10.5 K/uL   RBC 5.39 4.22 - 5.81 MIL/uL   Hemoglobin 13.5 13.0 - 17.0 g/dL   HCT 55.9 60.9 - 47.9 %   MCV 81.6 80.0 - 100.0 fL   MCH 25.0 (L) 26.0 - 34.0 pg   MCHC 30.7 30.0 - 36.0 g/dL   RDW 83.8 (H) 88.4 - 84.4 %   Platelets 296 150 - 400 K/uL   nRBC 0.0 0.0 - 0.2 %   Neutrophils Relative % 95 %   Neutro Abs 18.0 (H) 1.7 - 7.7 K/uL   Lymphocytes Relative 1 %   Lymphs Abs 0.2 (L) 0.7 - 4.0 K/uL   Monocytes Relative 4 %   Monocytes Absolute 0.8 0.1 - 1.0 K/uL   Eosinophils Relative 0 %   Eosinophils Absolute 0.0 0.0 - 0.5 K/uL   Basophils Relative 0 %   Basophils Absolute 0.0 0.0 - 0.1 K/uL   WBC Morphology See Note     Comment:  Morphology unremarkable   RBC Morphology MORPHOLOGY UNREMARKABLE     Comment:  Morphology unremarkable   Smear Review See Note     Comment:  Normal Platelet Morphology Performed at [facility], [address] [place], [place] [address]   CK     Status: Abnormal   Collection Time: 01/14/24  1:42 PM  Result Value Ref Range   Total CK 2,755 (H) 49 - 397 U/L    Comment: Performed at [facility], [address] [place], [place] [address]  Type and screen [facility]     Status: None   Collection Time: 01/14/24  1:45 PM  Result Value Ref Range   ABO/RH(D) AB POS    Antibody Screen NEG    Sample Expiration      01/17/2024,2359 Performed at [facility], [address], [place], [place] [address]  Protime-INR     Status: Abnormal   Collection Time: 01/14/24  1:50 PM  Result Value Ref Range   Prothrombin Time 18.4 (H) 11.4 - 15.2 seconds   INR 1.5 (H) 0.8 - 1.2    Comment: (NOTE) INR goal varies based on device and disease states. Performed at [facility], [address] [place],  [place] [address]   I-Stat Lactic Acid     Status: Abnormal   Collection Time: 01/14/24  1:57 PM  Result Value Ref Range   Lactic Acid, Venous 5.5 (HH) 0.5 - 1.9 mmol/L   Comment NOTIFIED PHYSICIAN   I-Stat Lactic Acid     Status: Abnormal   Collection Time: 01/14/24  4:19 PM  Result Value Ref Range   Lactic Acid, Venous 2.4 (HH) 0.5 - 1.9 mmol/L   Comment NOTIFIED PHYSICIAN    CT ABDOMEN PELVIS WO CONTRAST Result Date: 01/14/2024 CLINICAL DATA:  Sepsis EXAM: CT ABDOMEN AND PELVIS WITHOUT CONTRAST TECHNIQUE: Multidetector CT imaging of the abdomen and pelvis was performed following the standard protocol without IV contrast. RADIATION DOSE REDUCTION: This exam was performed according to the departmental dose-optimization program which includes automated exposure control, adjustment of the mA and/or kV according to patient size and/or use of iterative reconstruction technique. COMPARISON:  CT abdomen and pelvis 08/21/2023 FINDINGS: Lower chest: No acute abnormality. Hepatobiliary: No focal liver abnormality is seen. Status post cholecystectomy. No biliary dilatation. Pancreas: Unremarkable. No pancreatic ductal dilatation or surrounding inflammatory changes. Spleen: Normal in size without focal abnormality. Adrenals/Urinary Tract: There is mild nonspecific bilateral perinephric fat stranding. There is no hydronephrosis or urinary tract calculus. The adrenal glands and bladder are within normal limits Stomach/Bowel: There is wall thickening of the cecum and ascending colon as well as terminal ileum. There are matted small bowel loops in the right lower quadrant with wall thickening and surrounding inflammation. There is dilated appendix measuring 10 cm image 6/23. There is marked mesenteric edema and interloop fluid in the right lower quadrant. This is not definitively centered surrounding the appendix. There is a questionable tiny focus of free air in the right lower quadrant adjacent to terminal ileum image  6/39. No evidence for bowel obstruction there is wall thickening of the distal esophagus. The stomach is within normal limits. Vascular/Lymphatic: Aorta and IVC are normal in size. There are atherosclerotic calcifications of the aorta. There are multiple nonenlarged retroperitoneal and central mesenteric lymph nodes. Reproductive: Prostate is unremarkable. Other: There is a small fat containing umbilical hernia. Musculoskeletal: No acute or significant osseous findings. IMPRESSION: 1. Marked inflammatory process in the right lower quadrant with wall thickening of the cecum, ascending colon, terminal ileum and matted small bowel loops. There is marked mesenteric edema and interloop fluid. There is a questionable tiny focus of free air in the right lower quadrant adjacent to the terminal ileum. Findings are concerning for perforated viscus, possibly from the terminal ileum. 2. The appendix is dilated measuring 10 cm. Findings may be related to appendicitis or reactive change. 3. Wall thickening of the distal esophagus may represent esophagitis. 4. Aortic atherosclerosis. Aortic Atherosclerosis (ICD10-I70.0). Electronically Signed   By: Greig Pique M.D.   On: 01/14/2024 15:36   DG Chest Portable 1 View Result Date: 01/14/2024 CLINICAL DATA:  ?pna.  Unwitnessed fall.  Weakness. EXAM: PORTABLE CHEST 1 VIEW COMPARISON:  11/13/2011. FINDINGS: Please note portions of bilateral lung bases/lateral costophrenic angles is not included in the film. Low lung volume. Bilateral lung fields are clear. Bilateral  costophrenic angles are clear. Normal cardio-mediastinal silhouette. No acute osseous abnormalities. The soft tissues are within normal limits. IMPRESSION: No active disease. Electronically Signed   By: Ree Molt M.D.   On: 01/14/2024 14:03    Anti-infectives (From admission, onward)    Start     Dose/Rate Route Frequency Ordered Stop   01/15/24 1000  cefTRIAXone  (ROCEPHIN ) 2 g in sodium chloride  0.9 % 100 mL  IVPB        2 g 200 mL/hr over 30 Minutes Intravenous Every 24 hours 01/14/24 1626 01/25/24 0959   01/14/24 2200  metroNIDAZOLE  (FLAGYL ) IVPB 500 mg        500 mg 100 mL/hr over 60 Minutes Intravenous Every 12 hours 01/14/24 1626 01/24/24 2159   01/14/24 1515  piperacillin -tazobactam (ZOSYN ) IVPB 3.375 g        3.375 g 100 mL/hr over 30 Minutes Intravenous STAT 01/14/24 1509 01/15/24 1515   01/14/24 1515  vancomycin  (VANCOREADY) IVPB 2000 mg/400 mL        2,000 mg 200 mL/hr over 120 Minutes Intravenous  Once 01/14/24 1511         Assessment/Plan This is a 63 year old male with a history of hypertension and diabetes status post lower extremity amputations who is wheelchair-bound who presented after being found down.  Patient reports yesterday he began having diffuse right sided abdominal pain with associated vomiting and diarrhea.  He reports he slid out of his wheelchair last night and was unable to get up.  He was found by EMS today covered in bloody vomit per notes.  Since arrival he is been found to be febrile to 101.8, tachycardic in the 120-130s and hypertensive.  WBC 18.9.  Lactic 5.5-->2.4 after 2 L IV fluid bolus.  Noted AKI with creatinine of 2.47 (baseline 1.4 (with elevated CK of 2755.  His CT A/P showed marked inflammatory process in the RLQ with wall thickening of the cecum, ascending colon, terminal ileum and matted small bowel loops with marked mesenteric edema and interloop fluid with a questionable tiny focus of free air in the RLQ adjacent to the terminal ileum as well as a dilated appendix to 10 cm without an obvious appendicolith.  On exam patient has mild tenderness to palpation in the RLQ with more significant tender in the RUQ with grimacing and at least voluntary guarding intermittently.  Patient just received morphine  prior to my arrival and was very lethargic, requiring me to wake him up multiple times during history and exam.  There were several times when palpating the  RUQ that he did not show signs of involuntary guarding.  The remainder of his abdomen was soft and there is no evidence of rigidity.  I did discuss with EDP who received the patient in signout but reported the patient had diffuse lower abdominal tenderness.  The original provider that saw him exam reports no abdominal tenderness on their note. I am concerned my exam is not a reliable exam given the patient's decreased LOC during my visit.  I reviewed this with my attending. We recommend no current emergency surgery, continue resuscitation with trending labs, continue broad-spectrum antibiotics and we will have the on-call provider stop by to reevaluate the patient when the patient is more awake and alert to have a more reliable exam and decide if patient needs to proceed to the operating room tonight. We will follow with you closely.   FEN - NPO, IVF per primary  VTE - SCDs, okay for chem ppx from a  general surgery standpoint ID - Zosyn  x 1 in ED. Recommend continuing Zosyn  Foley - None currently Dispo - Admit to IM. Monitor to ensure patient does not need higher level of care.   I reviewed nursing notes, ED provider notes, last 24 h vitals and pain scores, last 48 h intake and output, last 24 h labs and trends, and last 24 h imaging results.   Ozell CHRISTELLA Shaper, [facility] 01/14/2024, 4:39 PM Please see Amion for pager number during day hours 7:00am-4:30pm

## 2024-01-14 NOTE — Progress Notes (Signed)
 Patient sleeping but easily arousable. He has abdominal pain throughout. He is tender in right lower quadrant and lower middle areas. Tachycardia noted. -continue bowel rest and antibiotics

## 2024-01-14 NOTE — Sepsis Progress Note (Signed)
 Notified provider of need to order fluid bolus pt needs 3390 cc.

## 2024-01-14 NOTE — Hospital Course (Addendum)
 Vitamin C 1000 mg daily Aspirin  81 mg daily Vitamin D  50 mcg daily (2000 international units ) daily  Colace 100 mg daily Lantus  20 units nightly Lisinopril  5 mg daily Oxycodone  5 mg every 4 hours as needed Simvastatin  10 mg daily Flomax  0.4 mg daily Zinc  sulfate 220 mg daily Acidophilus capsile   Social: Lives with: Occupation: Level of function: Support: PCP:   Endorses that last night he slide out of his wheelchair. He had no help at home to get him out of the wheel chair. He states he was down for several hours. No one called the paramedics. He is a poor hisotrian. He startes that the abdominal pain started yesterday. He reports that he was nasuted. He did not throw up. He does endorse vomting.    [name]  No n/v No dysuria Tolerated diet No pain, almost zero Wants to know when he can go home Will talk to surgeon His appendix burst, patient explain that appendix is a nonvital organ PT said he needs to go to a rehab facility for one month before he can go home, after that he can go home. The faster he gets stronger the faster he gets home. Will have social worker come to talk to him about bed placements. He was also recommended to talk to his family. Hope to discharge before the weekend.

## 2024-01-14 NOTE — H&P (Incomplete)
 Internal Medicine Teaching Service Attending Note Date: 01/14/2024  Patient name: [name]  Medical record number: [id number]  Date of birth: November 15, 1960   I have seen and evaluated [name] and discussed their care with the Residency Team.   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 He denies n/v. He fell from his wheel chair at home and is unclear how long he was down (hours). Paramedics came, found him covered in dark emesis, and took him to the hospital. He is unclear how they were notified.  In ED he is fatigued after being given morphine .  He was found to have lactate of 5, WBC 18.9, and c/o R sided abd pain.  His CT scan was concerning for perforated viscus near the ileum, esophagitis, and appendicitis/dilated appendix (10cm).    Physical Exam: Blood pressure 113/67, pulse (!) 124, temperature (S) (!) 101.8 F (38.8 C), temperature source (S) Axillary, resp. rate 14, height 5' 9 (1.753 m), weight 110.7 kg, SpO2 96%. General appearance: fatigued and mild distress Eyes: negative findings: conjunctivae and sclerae normal and pupils equal, round, reactive to light and accomodation Throat: lips, mucosa, and tongue normal; teeth and gums normal Neck: no adenopathy and supple, symmetrical, trachea midline Resp: clear to auscultation bilaterally Cardio: regularly irregular rhythm GI: normal findings: soft and abnormal findings:  hypoactive bowel sounds and diffuse abd pain.  Extremities: edema none.  Lab results: Results for orders placed or performed during the hospital encounter of 01/14/24 (from the past 24 hours)  Resp panel by RT-PCR (RSV, Flu A&B, Covid) Anterior Nasal Swab     Status: None   Collection Time: 01/14/24  1:33 PM   Specimen: Anterior Nasal Swab  Result Value Ref Range   SARS Coronavirus 2 by RT PCR NEGATIVE NEGATIVE   Influenza A by PCR NEGATIVE NEGATIVE    Influenza B by PCR NEGATIVE NEGATIVE   Resp Syncytial Virus by PCR NEGATIVE NEGATIVE  CBG monitoring, ED     Status: Abnormal   Collection Time: 01/14/24  1:38 PM  Result Value Ref Range   Glucose-Capillary 213 (H) 70 - 99 mg/dL  Comprehensive metabolic panel     Status: Abnormal   Collection Time: 01/14/24  1:42 PM  Result Value Ref Range   Sodium 140 135 - 145 mmol/L   Potassium 3.5 3.5 - 5.1 mmol/L   Chloride 102 98 - 111 mmol/L   CO2 23 22 - 32 mmol/L   Glucose, Bld 240 (H) 70 - 99 mg/dL   BUN 36 (H) 8 - 23 mg/dL   Creatinine, Ser 7.52 (H) 0.61 - 1.24 mg/dL   Calcium 8.7 (L) 8.9 - 10.3 mg/dL   Total Protein 7.4 6.5 - 8.1 g/dL   Albumin 3.1 (L) 3.5 - 5.0 g/dL   AST 50 (H) 15 - 41 U/L   ALT 21 0 - 44 U/L   Alkaline Phosphatase 63 38 - 126 U/L   Total Bilirubin 0.7 0.0 - 1.2 mg/dL   GFR, Estimated 28 (L) >60 mL/min   Anion gap 15 5 - 15  Lipase, blood     Status: None   Collection Time: 01/14/24  1:42 PM  Result Value Ref Range   Lipase 31 11 - 51 U/L  CBC with Differential     Status: Abnormal   Collection Time: 01/14/24  1:42 PM  Result Value Ref Range  WBC 18.9 (H) 4.0 - 10.5 K/uL   RBC 5.39 4.22 - 5.81 MIL/uL   Hemoglobin 13.5 13.0 - 17.0 g/dL   HCT 55.9 60.9 - 47.9 %   MCV 81.6 80.0 - 100.0 fL   MCH 25.0 (L) 26.0 - 34.0 pg   MCHC 30.7 30.0 - 36.0 g/dL   RDW 83.8 (H) 88.4 - 84.4 %   Platelets 296 150 - 400 K/uL   nRBC 0.0 0.0 - 0.2 %   Neutrophils Relative % 95 %   Neutro Abs 18.0 (H) 1.7 - 7.7 K/uL   Lymphocytes Relative 1 %   Lymphs Abs 0.2 (L) 0.7 - 4.0 K/uL   Monocytes Relative 4 %   Monocytes Absolute 0.8 0.1 - 1.0 K/uL   Eosinophils Relative 0 %   Eosinophils Absolute 0.0 0.0 - 0.5 K/uL   Basophils Relative 0 %   Basophils Absolute 0.0 0.0 - 0.1 K/uL   WBC Morphology See Note    RBC Morphology MORPHOLOGY UNREMARKABLE    Smear Review See Note   CK     Status: Abnormal   Collection Time: 01/14/24  1:42 PM  Result Value Ref Range   Total CK 2,755 (H)  49 - 397 U/L  Type and screen [facility]     Status: None   Collection Time: 01/14/24  1:45 PM  Result Value Ref Range   ABO/RH(D) AB POS    Antibody Screen NEG    Sample Expiration      01/17/2024,2359 Performed at [facility], [address] [place], [place] [address]   Protime-INR     Status: Abnormal   Collection Time: 01/14/24  1:50 PM  Result Value Ref Range   Prothrombin Time 18.4 (H) 11.4 - 15.2 seconds   INR 1.5 (H) 0.8 - 1.2  I-Stat Lactic Acid     Status: Abnormal   Collection Time: 01/14/24  1:57 PM  Result Value Ref Range   Lactic Acid, Venous 5.5 (HH) 0.5 - 1.9 mmol/L   Comment NOTIFIED PHYSICIAN   I-Stat Lactic Acid     Status: Abnormal   Collection Time: 01/14/24  4:19 PM  Result Value Ref Range   Lactic Acid, Venous 2.4 (HH) 0.5 - 1.9 mmol/L   Comment NOTIFIED PHYSICIAN     Imaging results:  CT ABDOMEN PELVIS WO CONTRAST Result Date: 01/14/2024 CLINICAL DATA:  Sepsis EXAM: CT ABDOMEN AND PELVIS WITHOUT CONTRAST TECHNIQUE: Multidetector CT imaging of the abdomen and pelvis was performed following the standard protocol without IV contrast. RADIATION DOSE REDUCTION: This exam was performed according to the departmental dose-optimization program which includes automated exposure control, adjustment of the mA and/or kV according to patient size and/or use of iterative reconstruction technique. COMPARISON:  CT abdomen and pelvis 08/21/2023 FINDINGS: Lower chest: No acute abnormality. Hepatobiliary: No focal liver abnormality is seen. Status post cholecystectomy. No biliary dilatation. Pancreas: Unremarkable. No pancreatic ductal dilatation or surrounding inflammatory changes. Spleen: Normal in size without focal abnormality. Adrenals/Urinary Tract: There is mild nonspecific bilateral perinephric fat stranding. There is no hydronephrosis or urinary tract calculus. The adrenal glands and bladder are within normal limits Stomach/Bowel: There is wall  thickening of the cecum and ascending colon as well as terminal ileum. There are matted small bowel loops in the right lower quadrant with wall thickening and surrounding inflammation. There is dilated appendix measuring 10 cm image 6/23. There is marked mesenteric edema and interloop fluid in the right lower quadrant. This is not  definitively centered surrounding the appendix. There is a questionable tiny focus of free air in the right lower quadrant adjacent to terminal ileum image 6/39. No evidence for bowel obstruction there is wall thickening of the distal esophagus. The stomach is within normal limits. Vascular/Lymphatic: Aorta and IVC are normal in size. There are atherosclerotic calcifications of the aorta. There are multiple nonenlarged retroperitoneal and central mesenteric lymph nodes. Reproductive: Prostate is unremarkable. Other: There is a small fat containing umbilical hernia. Musculoskeletal: No acute or significant osseous findings. IMPRESSION: 1. Marked inflammatory process in the right lower quadrant with wall thickening of the cecum, ascending colon, terminal ileum and matted small bowel loops. There is marked mesenteric edema and interloop fluid. There is a questionable tiny focus of free air in the right lower quadrant adjacent to the terminal ileum. Findings are concerning for perforated viscus, possibly from the terminal ileum. 2. The appendix is dilated measuring 10 cm. Findings may be related to appendicitis or reactive change. 3. Wall thickening of the distal esophagus may represent esophagitis. 4. Aortic atherosclerosis. Aortic Atherosclerosis (ICD10-I70.0). Electronically Signed   By: Greig Pique M.D.   On: 01/14/2024 15:36   DG Chest Portable 1 View Result Date: 01/14/2024 CLINICAL DATA:  ?pna.  Unwitnessed fall.  Weakness. EXAM: PORTABLE CHEST 1 VIEW COMPARISON:  11/13/2011. FINDINGS: Please note portions of bilateral lung bases/lateral costophrenic angles is not included in the  film. Low lung volume. Bilateral lung fields are clear. Bilateral costophrenic angles are clear. Normal cardio-mediastinal silhouette. No acute osseous abnormalities. The soft tissues are within normal limits. IMPRESSION: No active disease. Electronically Signed   By: Ree Molt M.D.   On: 01/14/2024 14:03    Assessment and Plan: I agree with the formulated Assessment and Plan with the following changes:  Abdominal pain ? Perforated Viscus ?Appendicitis DM2 Acidosis Rhabdomyolysis  Will hydrate him aggressively Watch his Cr, and follow lactate (repeat 2.4) Agree with broad spectrum anbx Appreciate surgical eval.      Eben Reyes BROCKS, MD

## 2024-01-14 NOTE — Sepsis Progress Note (Signed)
 Elink monitoring for the code sepsis protocol.

## 2024-01-15 ENCOUNTER — Other Ambulatory Visit (HOSPITAL_COMMUNITY)

## 2024-01-15 ENCOUNTER — Inpatient Hospital Stay (HOSPITAL_COMMUNITY): Admitting: Certified Registered Nurse Anesthetist

## 2024-01-15 ENCOUNTER — Encounter (HOSPITAL_COMMUNITY): Payer: Self-pay | Admitting: Infectious Diseases

## 2024-01-15 ENCOUNTER — Other Ambulatory Visit: Payer: Self-pay

## 2024-01-15 ENCOUNTER — Inpatient Hospital Stay (HOSPITAL_COMMUNITY)

## 2024-01-15 ENCOUNTER — Encounter (HOSPITAL_COMMUNITY)
Admission: EM | Disposition: A | Discharge status: Skilled Nursing Facility | Payer: Self-pay | Source: Home / Self Care | Attending: Infectious Diseases

## 2024-01-15 DIAGNOSIS — I10 Essential (primary) hypertension: Secondary | ICD-10-CM | POA: Diagnosis not present

## 2024-01-15 DIAGNOSIS — M6282 Rhabdomyolysis: Secondary | ICD-10-CM | POA: Insufficient documentation

## 2024-01-15 DIAGNOSIS — K3533 Acute appendicitis with perforation and localized peritonitis, with abscess: Secondary | ICD-10-CM

## 2024-01-15 DIAGNOSIS — K35219 Acute appendicitis with generalized peritonitis, with abscess, unspecified as to perforation: Secondary | ICD-10-CM

## 2024-01-15 DIAGNOSIS — T796XXD Traumatic ischemia of muscle, subsequent encounter: Secondary | ICD-10-CM

## 2024-01-15 DIAGNOSIS — K529 Noninfective gastroenteritis and colitis, unspecified: Secondary | ICD-10-CM

## 2024-01-15 DIAGNOSIS — Z87891 Personal history of nicotine dependence: Secondary | ICD-10-CM | POA: Diagnosis not present

## 2024-01-15 HISTORY — PX: LAPAROSCOPY: SHX197

## 2024-01-15 HISTORY — PX: LAPAROSCOPIC APPENDECTOMY: SHX408

## 2024-01-15 LAB — GLUCOSE, CAPILLARY
Glucose-Capillary: 154 mg/dL — ABNORMAL HIGH (ref 70–99)
Glucose-Capillary: 119 mg/dL — ABNORMAL HIGH (ref 70–99)
Glucose-Capillary: 131 mg/dL — ABNORMAL HIGH (ref 70–99)
Glucose-Capillary: 151 mg/dL — ABNORMAL HIGH (ref 70–99)
Glucose-Capillary: 150 mg/dL — ABNORMAL HIGH (ref 70–99)

## 2024-01-15 LAB — BASIC METABOLIC PANEL WITH GFR
Anion gap: 10 (ref 5–15)
BUN: 31 mg/dL — ABNORMAL HIGH (ref 8–23)
CO2: 23 mmol/L (ref 22–32)
Calcium: 8.3 mg/dL — ABNORMAL LOW (ref 8.9–10.3)
Chloride: 108 mmol/L (ref 98–111)
Creatinine, Ser: 2.06 mg/dL — ABNORMAL HIGH (ref 0.61–1.24)
GFR, Estimated: 35 mL/min — ABNORMAL LOW (ref >60)
Glucose, Bld: 153 mg/dL — ABNORMAL HIGH (ref 70–99)
Potassium: 3.5 mmol/L (ref 3.5–5.1)
Sodium: 141 mmol/L (ref 135–145)

## 2024-01-15 LAB — CBC
HCT: 38.7 % — ABNORMAL LOW (ref 39.0–52.0)
Hemoglobin: 12.1 g/dL — ABNORMAL LOW (ref 13.0–17.0)
MCH: 25.5 pg — ABNORMAL LOW (ref 26.0–34.0)
MCHC: 31.3 g/dL (ref 30.0–36.0)
MCV: 81.5 fL (ref 80.0–100.0)
Platelets: 233 K/uL (ref 150–400)
RBC: 4.75 MIL/uL (ref 4.22–5.81)
RDW: 16 % — ABNORMAL HIGH (ref 11.5–15.5)
WBC: 15 K/uL — ABNORMAL HIGH (ref 4.0–10.5)
nRBC: 0 % (ref 0.0–0.2)

## 2024-01-15 LAB — SURGICAL PCR SCREEN
MRSA, PCR: NEGATIVE
Staphylococcus aureus: NEGATIVE

## 2024-01-15 LAB — HEMOGLOBIN A1C
Hgb A1c MFr Bld: 6 % — ABNORMAL HIGH (ref 4.8–5.6)
Mean Plasma Glucose: 126 mg/dL

## 2024-01-15 LAB — LACTIC ACID, PLASMA: Lactic Acid, Venous: 1.2 mmol/L (ref 0.5–1.9)

## 2024-01-15 LAB — CK: Total CK: 4583 U/L — ABNORMAL HIGH (ref 49–397)

## 2024-01-15 LAB — VANCOMYCIN, RANDOM: Vancomycin Rm: 7 ug/mL

## 2024-01-15 SURGERY — LAPAROSCOPY, DIAGNOSTIC
Anesthesia: General

## 2024-01-15 MED ORDER — BUPIVACAINE-EPINEPHRINE (PF) 0.5% -1:200000 IJ SOLN
INTRAMUSCULAR | Status: AC
Start: 2024-01-15 — End: 2024-01-15
  Filled 2024-01-15: qty 30

## 2024-01-15 MED ORDER — HYDROMORPHONE HCL 1 MG/ML IJ SOLN
INTRAMUSCULAR | Status: AC
  Filled 2024-01-15: qty 0.5

## 2024-01-15 MED ORDER — PANTOPRAZOLE SODIUM 40 MG PO TBEC
40.0000 mg | DELAYED_RELEASE_TABLET | Freq: Two times a day (BID) | ORAL | Status: AC
  Administered 2024-01-17 – 2024-01-19 (×5): 40 mg via ORAL
  Filled 2024-01-15 (×7): qty 1

## 2024-01-15 MED ORDER — MIDAZOLAM HCL 2 MG/2ML IJ SOLN
INTRAMUSCULAR | Status: AC
  Filled 2024-01-15: qty 2

## 2024-01-15 MED ORDER — LACTATED RINGERS IV SOLN: INTRAVENOUS | Status: AC

## 2024-01-15 MED ORDER — CHLORHEXIDINE GLUCONATE CLOTH 2 % EX PADS: 6.0000 | MEDICATED_PAD | Freq: Once | CUTANEOUS | Status: DC

## 2024-01-15 MED ORDER — ONDANSETRON HCL 4 MG/2ML IJ SOLN: 4.0000 mg | Freq: Once | INTRAMUSCULAR | Status: DC | PRN

## 2024-01-15 MED ORDER — PHENYLEPHRINE 80 MCG/ML (10ML) SYRINGE FOR IV PUSH (FOR BLOOD PRESSURE SUPPORT)
PREFILLED_SYRINGE | INTRAVENOUS | Status: DC | PRN
  Administered 2024-01-15: 80 ug via INTRAVENOUS

## 2024-01-15 MED ORDER — ORAL CARE MOUTH RINSE: 15.0000 mL | Freq: Once | OROMUCOSAL | Status: AC

## 2024-01-15 MED ORDER — FENTANYL CITRATE (PF) 250 MCG/5ML IJ SOLN
INTRAMUSCULAR | Status: AC
  Filled 2024-01-15: qty 5

## 2024-01-15 MED ORDER — PHENYLEPHRINE 80 MCG/ML (10ML) SYRINGE FOR IV PUSH (FOR BLOOD PRESSURE SUPPORT)
PREFILLED_SYRINGE | INTRAVENOUS | Status: AC
  Filled 2024-01-15: qty 10

## 2024-01-15 MED ORDER — DEXAMETHASONE SODIUM PHOSPHATE 10 MG/ML IJ SOLN
INTRAMUSCULAR | Status: AC
  Filled 2024-01-15: qty 2

## 2024-01-15 MED ORDER — CHLORHEXIDINE GLUCONATE 0.12 % MT SOLN
OROMUCOSAL | Status: AC
  Administered 2024-01-15: 15 mL via OROMUCOSAL
  Filled 2024-01-15: qty 15

## 2024-01-15 MED ORDER — PROPOFOL 10 MG/ML IV BOLUS
INTRAVENOUS | Status: DC | PRN
  Administered 2024-01-15: 120 mg via INTRAVENOUS

## 2024-01-15 MED ORDER — FENTANYL CITRATE (PF) 100 MCG/2ML IJ SOLN
INTRAMUSCULAR | Status: AC
Start: 2024-01-15 — End: 2024-01-15
  Filled 2024-01-15: qty 2

## 2024-01-15 MED ORDER — AMIODARONE IV BOLUS ONLY 150 MG/100ML
INTRAVENOUS | Status: AC
  Filled 2024-01-15: qty 100

## 2024-01-15 MED ORDER — ACETAMINOPHEN 10 MG/ML IV SOLN
INTRAVENOUS | Status: AC
  Filled 2024-01-15: qty 100

## 2024-01-15 MED ORDER — FENTANYL CITRATE (PF) 250 MCG/5ML IJ SOLN
INTRAMUSCULAR | Status: DC | PRN
  Administered 2024-01-15 (×5): 50 ug via INTRAVENOUS

## 2024-01-15 MED ORDER — FENTANYL CITRATE (PF) 100 MCG/2ML IJ SOLN
25.0000 ug | INTRAMUSCULAR | Status: DC | PRN
  Administered 2024-01-15: 25 ug via INTRAVENOUS

## 2024-01-15 MED ORDER — OXYCODONE HCL 5 MG/5ML PO SOLN: 5.0000 mg | Freq: Once | ORAL | Status: DC | PRN

## 2024-01-15 MED ORDER — CHLORHEXIDINE GLUCONATE 0.12 % MT SOLN: 15.0000 mL | Freq: Once | OROMUCOSAL | Status: AC

## 2024-01-15 MED ORDER — ONDANSETRON HCL 4 MG/2ML IJ SOLN
INTRAMUSCULAR | Status: DC | PRN
  Administered 2024-01-15: 4 mg via INTRAVENOUS

## 2024-01-15 MED ORDER — LIDOCAINE 2% (20 MG/ML) 5 ML SYRINGE
INTRAMUSCULAR | Status: AC
  Filled 2024-01-15: qty 15

## 2024-01-15 MED ORDER — ROCURONIUM BROMIDE 10 MG/ML (PF) SYRINGE
PREFILLED_SYRINGE | INTRAVENOUS | Status: DC | PRN
  Administered 2024-01-15: 10 mg via INTRAVENOUS
  Administered 2024-01-15: 60 mg via INTRAVENOUS

## 2024-01-15 MED ORDER — ONDANSETRON HCL 4 MG/2ML IJ SOLN
INTRAMUSCULAR | Status: AC
  Filled 2024-01-15: qty 4

## 2024-01-15 MED ORDER — OXYCODONE HCL 5 MG PO TABS: 5.0000 mg | ORAL_TABLET | Freq: Once | ORAL | Status: DC | PRN

## 2024-01-15 MED ORDER — DEXAMETHASONE SODIUM PHOSPHATE 10 MG/ML IJ SOLN
INTRAMUSCULAR | Status: DC | PRN
  Administered 2024-01-15: 5 mg via INTRAVENOUS

## 2024-01-15 MED ORDER — MUPIROCIN 2 % EX OINT
1.0000 | TOPICAL_OINTMENT | Freq: Two times a day (BID) | CUTANEOUS | Status: AC
  Administered 2024-01-15 – 2024-01-19 (×9): 1 via NASAL
  Filled 2024-01-15 (×2): qty 22

## 2024-01-15 MED ORDER — ROCURONIUM BROMIDE 10 MG/ML (PF) SYRINGE
PREFILLED_SYRINGE | INTRAVENOUS | Status: AC
  Filled 2024-01-15: qty 20

## 2024-01-15 MED ORDER — ALBUMIN HUMAN 5 % IV SOLN: INTRAVENOUS | Status: DC | PRN

## 2024-01-15 MED ORDER — SODIUM CHLORIDE 0.9 % IV SOLN: INTRAVENOUS | Status: DC

## 2024-01-15 MED ORDER — SUCCINYLCHOLINE CHLORIDE 200 MG/10ML IV SOSY
PREFILLED_SYRINGE | INTRAVENOUS | Status: DC | PRN
  Administered 2024-01-15: 120 mg via INTRAVENOUS

## 2024-01-15 MED ORDER — VANCOMYCIN HCL IN DEXTROSE 1-5 GM/200ML-% IV SOLN
1000.0000 mg | Freq: Once | INTRAVENOUS | Status: AC
  Administered 2024-01-15: 1000 mg via INTRAVENOUS
  Filled 2024-01-15: qty 200

## 2024-01-15 MED ORDER — SUCCINYLCHOLINE CHLORIDE 200 MG/10ML IV SOSY
PREFILLED_SYRINGE | INTRAVENOUS | Status: AC
  Filled 2024-01-15: qty 20

## 2024-01-15 MED ORDER — LIDOCAINE 2% (20 MG/ML) 5 ML SYRINGE
INTRAMUSCULAR | Status: DC | PRN
  Administered 2024-01-15: 100 mg via INTRAVENOUS

## 2024-01-15 MED ORDER — ACETAMINOPHEN 10 MG/ML IV SOLN
1000.0000 mg | Freq: Once | INTRAVENOUS | Status: DC | PRN
  Administered 2024-01-15: 1000 mg via INTRAVENOUS

## 2024-01-15 MED ORDER — MIDAZOLAM HCL 2 MG/2ML IJ SOLN
INTRAMUSCULAR | Status: DC | PRN
  Administered 2024-01-15: 1 mg via INTRAVENOUS

## 2024-01-15 SURGICAL SUPPLY — 41 items
BAG COUNTER SPONGE SURGICOUNT (BAG) ×2 IMPLANT
BIOPATCH RED 1 DISK 7.0 (GAUZE/BANDAGES/DRESSINGS) IMPLANT
BLADE CLIPPER SURG (BLADE) IMPLANT
CANISTER SUCTION 3000ML PPV (SUCTIONS) IMPLANT
CHLORAPREP W/TINT 26 (MISCELLANEOUS) ×2 IMPLANT
COVER SURGICAL LIGHT HANDLE (MISCELLANEOUS) ×2 IMPLANT
CUTTER FLEX LINEAR 45M (STAPLE) IMPLANT
DERMABOND ADVANCED .7 DNX12 (GAUZE/BANDAGES/DRESSINGS) ×2 IMPLANT
DRAIN CHANNEL 19F RND (DRAIN) IMPLANT
DRAPE WARM FLUID 44X44 (DRAPES) ×2 IMPLANT
DRSG TEGADERM 4X4.75 (GAUZE/BANDAGES/DRESSINGS) IMPLANT
ELECTRODE REM PT RTRN 9FT ADLT (ELECTROSURGICAL) ×2 IMPLANT
GLOVE BIO SURGEON STRL SZ8 (GLOVE) ×2 IMPLANT
GLOVE BIOGEL PI IND STRL 8 (GLOVE) ×2 IMPLANT
GOWN STRL REUS W/ TWL LRG LVL3 (GOWN DISPOSABLE) ×4 IMPLANT
GOWN STRL REUS W/ TWL XL LVL3 (GOWN DISPOSABLE) ×2 IMPLANT
IRRIGATION SUCT STRKRFLW 2 WTP (MISCELLANEOUS) IMPLANT
KIT BASIN OR (CUSTOM PROCEDURE TRAY) ×2 IMPLANT
KIT TURNOVER KIT B (KITS) ×4 IMPLANT
NS IRRIG 1000ML POUR BTL (IV SOLUTION) ×2 IMPLANT
PAD ARMBOARD POSITIONER FOAM (MISCELLANEOUS) ×2 IMPLANT
PENCIL SMOKE EVACUATOR (MISCELLANEOUS) IMPLANT
RELOAD STAPLE 45 3.5 BLU ETS (ENDOMECHANICALS) IMPLANT
SCISSORS LAP 5X35 DISP (ENDOMECHANICALS) IMPLANT
SET TUBE SMOKE EVAC HIGH FLOW (TUBING) ×2 IMPLANT
SHEARS HARMONIC 36 ACE (MISCELLANEOUS) IMPLANT
SLEEVE Z-THREAD 5X100MM (TROCAR) ×2 IMPLANT
SPIKE FLUID TRANSFER (MISCELLANEOUS) ×2 IMPLANT
STAPLER SKIN PROX 35W (STAPLE) IMPLANT
SUT ETHILON 2 0 FS 18 (SUTURE) IMPLANT
SUT MNCRL AB 4-0 PS2 18 (SUTURE) ×2 IMPLANT
SUT PDS AB 1 TP1 54 (SUTURE) IMPLANT
SUT VIC AB 3-0 SH 18 (SUTURE) IMPLANT
SYSTEM LAPSCP GELPORT 120MM (MISCELLANEOUS) IMPLANT
TOWEL GREEN STERILE (TOWEL DISPOSABLE) ×2 IMPLANT
TOWEL GREEN STERILE FF (TOWEL DISPOSABLE) ×2 IMPLANT
TRAY LAPAROSCOPIC MC (CUSTOM PROCEDURE TRAY) ×2 IMPLANT
TROCAR 11X100 Z THREAD (TROCAR) IMPLANT
TROCAR BALLN 12MMX100 BLUNT (TROCAR) IMPLANT
TROCAR Z-THREAD OPTICAL 5X100M (TROCAR) ×2 IMPLANT
WARMER LAPAROSCOPE (MISCELLANEOUS) ×2 IMPLANT

## 2024-01-15 NOTE — Op Note (Signed)
 Appendectomy, hand-assisted laparoscopic, Procedure Note  Indications: The patient presented with a history of right-sided abdominal pain.  The patient was found down at home.  The amount of time was unknown and the patient was found apparently by her neighbor.  He was brought in by EMS and evaluated.  He was somewhat somnolent and also had evidence of rhabdomyolysis.  He complained of abdominal pain when he was more loosened.  CT scan showed inflammatory changes in the right lower quadrant.  Difficult to discern if this was a perforated viscus, perforated appendix or other intra-abdominal pathology.  He was observed overnight with antibiotics.  He was stable.  He was resuscitated and his renal function improved.  He still had pain today I recommended laparoscopy to further evaluate his abdominal pain and CT findings as well as leukocytosis and tachycardia with fever.The procedure has been discussed with the patient.  Alternative therapies have been discussed with the patient.  Operative risks include bleeding,  Infection,  Organ injury,  Nerve injury,  Blood vessel injury,  DVT,  Pulmonary embolism,  Death,  And possible reoperation.  Medical management risks include worsening of present situation.  The success of the procedure is 50 -90 % at treating patients symptoms.  The patient understands and agrees to proceed.   Pre-operative Diagnosis: Abdominal pain, right lower quadrant  Post-operative Diagnosis: Acute appendicitis with peritoneal abscess  Surgeon: Debby LABOR Aya Geisel   Assistants: OR staff  Anesthesia: General endotracheal anesthesia and Local anesthesia 0.25.% bupivacaine   ASA Class: 2  Procedure Details  The patient was seen again in the Holding Room. The risks, benefits, complications, treatment options, and expected outcomes were discussed with the patient and/or family. The possibilities of reaction to medication, pulmonary aspiration, perforation of viscus, bleeding, recurrent  infection, finding a normal appendix, the need for additional procedures, failure to diagnose a condition, and creating a complication requiring transfusion or operation were discussed. There was concurrence with the proposed plan and informed consent was obtained. The site of surgery was properly noted/marked. The patient was taken to Operating Room, identified as [name] and the procedure verified as Appendectomy. A Time Out was held and the above information confirmed.  The patient was placed in the supine position and general anesthesia was induced, along with placement of orogastric tube, Venodyne boots, and a Foley catheter. The abdomen was prepped and draped in a sterile fashion. A one centimeter infraumbilical incision was made and the peritoneal cavity was accessed using the OPEN  technique. The pneumoperitoneum was then established to steady pressure of 12 mmHg. A 12 mm port was placed through the umbilical incision. Additional 5 mm cannulas then placed in the left lower quadrant and right upper quadrant under direct vision. A careful evaluation of the entire abdomen was carried out. The patient was placed in Trendelenburg and left lateral decubitus position.  There is significant signs of right lower quadrant inflammation with a small bowel matted up against the right lower quadrant.  Laparoscopically, we broke up multiple interloop abscesses.  I was unable to get the cecum mobilized laparoscopically.  I felt placement of a HandPort would be useful in the circumstance.  A lower midline incision was used at the umbilical port site for about 7 cm.  This was down to the midline fascia and this was opened the linea alba.  HandPort was placed to assist the procedure through this incision.  Umbilical port was then removed.  Once we did this I could use my left hand  and break up multiple loculations.  I was able to mobilize the cecum off the right lower quadrant attachments and the white line of Toldt.   I could then palpate the appendix which was densely inflamed the pelvis I was able to use my hand to help dissect up the mesoappendix with the help of the harmonic scalpel for hemostasis.  This was taken down to the base as best as I could see.  The appendix had ruptured and there was a large abscess with it.  It was very difficult to visualize even with a hand-assisted port but I could find the cecum and there was significant severe inflammation.  I found the junction of the cecum and appendix but this was somewhat difficult due to the inflammation involving the cecum.  I was able to skinny the mesoappendix down further  and this appeared to be the junction with with the tinea identifying the appendix junction with the cecum.  I exchanged the left lower quadrant 5 mm port for the S sign.  I was able then to place the GIA 45 stapler with a blue load through this and using the hand port to distract the appendix away from the cecum fired the stapler across the base of the appendix and cecum at its junction.  The appendix was removed with a hand-assisted port and passed off the field.  Copious irrigation using 3 L of saline in the right upper quadrant were used and suctioned out.  The mesoappendix had good hemostasis.  There is separate stab incision a 19 round drain was placed in the right lower quadrant into the right gutter down into the pelvis.  This was secured to skin with 2-0 nylon.  I reexamined the abdominal cavity.  There are some areas of small bowel that I felt to be examined.  I pulled this up through the hand-assisted port allowing the CO2 to escape.  I examined the small bowel and saw 1 small 5 mm serosal tear that I oversewed with 3-0 Vicryl.  The remainder of the small bowel was run that was inflamed and I saw no evidence of any other injury to the small intestine and replaced it back into the abdominal cavity.  Of note four-quadrant laparoscopy was performed prior to this and I saw no other  abnormality.  We then removed the HandPort.  Fascia closed with #1 PDS.  This wound was packed open with saline soaked gauze.  The drain drain was placed to bulb suction.  The other 2 port sites were closed staples.  All counts were found to be correct.  The patient was then awoke extubated taken to recovery in satisfactory condition.     The umbilical port site was closed using 0 vicryl pursestring sutures fashion at the level of the fascia. The trocar site skin wounds were closed using skin staples.  Instrument, sponge, and needle counts were correct at the conclusion of the case.   Findings: Perforated appendix with peritonitis, interloop abscesses of the ileum, right lower quadrant abscess Estimated Blood Loss:  less than 50 mL         Drains: 19 round         Total IV Fluids: Per OR record         Specimens: Appendix         Complications:  None; patient tolerated the procedure well.         Disposition: PACU - hemodynamically stable.  Condition: stable

## 2024-01-15 NOTE — Progress Notes (Signed)
 Subjective/Chief Complaint: Patient is more awake today.  Still complaining of right lower quadrant abdominal pain.  It is about the same as yesterday.  Still with tachycardia.   Objective: Vital signs in last 24 hours: Temp:  [98.8 F (37.1 C)-101.8 F (38.8 C)] 98.8 F (37.1 C) (08/01 0114) Pulse Rate:  [120-136] 121 (08/01 0030) Resp:  [7-39] 21 (08/01 0114) BP: (104-182)/(63-96) 147/71 (08/01 0114) SpO2:  [95 %-100 %] 98 % (08/01 0114) Weight:  [110.7 kg] 110.7 kg (07/31 1634)    Intake/Output from previous day: 07/31 0701 - 08/01 0700 In: -  Out: 1100 [Urine:1100] Intake/Output this shift: No intake/output data recorded.  Abdomen: Tender right lower quadrant but soft.  Lab Results:  Recent Labs    01/14/24 1342 01/15/24 0330  WBC 18.9* 15.0*  HGB 13.5 12.1*  HCT 44.0 38.7*  PLT 296 233   BMET Recent Labs    01/14/24 1342 01/15/24 0330  NA 140 141  K 3.5 3.5  CL 102 108  CO2 23 23  GLUCOSE 240* 153*  BUN 36* 31*  CREATININE 2.47* 2.06*  CALCIUM 8.7* 8.3*   PT/INR Recent Labs    01/14/24 1350  LABPROT 18.4*  INR 1.5*   ABG No results for input(s): PHART, HCO3 in the last 72 hours.  Invalid input(s): PCO2, PO2  Studies/Results: CT ABDOMEN PELVIS WO CONTRAST Result Date: 01/14/2024 CLINICAL DATA:  Sepsis EXAM: CT ABDOMEN AND PELVIS WITHOUT CONTRAST TECHNIQUE: Multidetector CT imaging of the abdomen and pelvis was performed following the standard protocol without IV contrast. RADIATION DOSE REDUCTION: This exam was performed according to the departmental dose-optimization program which includes automated exposure control, adjustment of the mA and/or kV according to patient size and/or use of iterative reconstruction technique. COMPARISON:  CT abdomen and pelvis 08/21/2023 FINDINGS: Lower chest: No acute abnormality. Hepatobiliary: No focal liver abnormality is seen. Status post cholecystectomy. No biliary dilatation. Pancreas:  Unremarkable. No pancreatic ductal dilatation or surrounding inflammatory changes. Spleen: Normal in size without focal abnormality. Adrenals/Urinary Tract: There is mild nonspecific bilateral perinephric fat stranding. There is no hydronephrosis or urinary tract calculus. The adrenal glands and bladder are within normal limits Stomach/Bowel: There is wall thickening of the cecum and ascending colon as well as terminal ileum. There are matted small bowel loops in the right lower quadrant with wall thickening and surrounding inflammation. There is dilated appendix measuring 10 cm image 6/23. There is marked mesenteric edema and interloop fluid in the right lower quadrant. This is not definitively centered surrounding the appendix. There is a questionable tiny focus of free air in the right lower quadrant adjacent to terminal ileum image 6/39. No evidence for bowel obstruction there is wall thickening of the distal esophagus. The stomach is within normal limits. Vascular/Lymphatic: Aorta and IVC are normal in size. There are atherosclerotic calcifications of the aorta. There are multiple nonenlarged retroperitoneal and central mesenteric lymph nodes. Reproductive: Prostate is unremarkable. Other: There is a small fat containing umbilical hernia. Musculoskeletal: No acute or significant osseous findings. IMPRESSION: 1. Marked inflammatory process in the right lower quadrant with wall thickening of the cecum, ascending colon, terminal ileum and matted small bowel loops. There is marked mesenteric edema and interloop fluid. There is a questionable tiny focus of free air in the right lower quadrant adjacent to the terminal ileum. Findings are concerning for perforated viscus, possibly from the terminal ileum. 2. The appendix is dilated measuring 10 cm. Findings may be related to appendicitis or reactive change.  3. Wall thickening of the distal esophagus may represent esophagitis. 4. Aortic atherosclerosis. Aortic  Atherosclerosis (ICD10-I70.0). Electronically Signed   By: Greig Pique M.D.   On: 01/14/2024 15:36   DG Chest Portable 1 View Result Date: 01/14/2024 CLINICAL DATA:  ?pna.  Unwitnessed fall.  Weakness. EXAM: PORTABLE CHEST 1 VIEW COMPARISON:  11/13/2011. FINDINGS: Please note portions of bilateral lung bases/lateral costophrenic angles is not included in the film. Low lung volume. Bilateral lung fields are clear. Bilateral costophrenic angles are clear. Normal cardio-mediastinal silhouette. No acute osseous abnormalities. The soft tissues are within normal limits. IMPRESSION: No active disease. Electronically Signed   By: Ree Molt M.D.   On: 01/14/2024 14:03    Anti-infectives: Anti-infectives (From admission, onward)    Start     Dose/Rate Route Frequency Ordered Stop   01/15/24 1000  cefTRIAXone  (ROCEPHIN ) 2 g in sodium chloride  0.9 % 100 mL IVPB        2 g 200 mL/hr over 30 Minutes Intravenous Every 24 hours 01/14/24 1626 01/25/24 0959   01/14/24 2200  metroNIDAZOLE  (FLAGYL ) IVPB 500 mg        500 mg 100 mL/hr over 60 Minutes Intravenous Every 12 hours 01/14/24 1626 01/24/24 2159   01/14/24 1657  vancomycin  variable dose per unstable renal function (pharmacist dosing)         Does not apply See admin instructions 01/14/24 1658     01/14/24 1515  piperacillin -tazobactam (ZOSYN ) IVPB 3.375 g        3.375 g 100 mL/hr over 30 Minutes Intravenous STAT 01/14/24 1509 01/14/24 1641   01/14/24 1515  vancomycin  (VANCOREADY) IVPB 2000 mg/400 mL        2,000 mg 200 mL/hr over 120 Minutes Intravenous  Once 01/14/24 1511 01/14/24 1854       Assessment/Plan: Abdominal pain-given his continued abdominal pain recommend diagnostic laparoscopy with possible laparotomy.  Reviewing his CT scan his appendix is abnormal and I suspect he has acute appendicitis but I also discussed with him other possibilities of cecal diverticulitis, sigmoid diverticulitis, small bowel pathology, and the need for  other treatment standard procedures depending on laparoscopic findings.  I reviewed bowel resection and colon resection with him this morning.  I did briefly discussed ostomy though I suspect he will not need one but again I discussed that with him today.  After discussing options of surgery versus continue antibiotic care, he is agreed to proceed with diagnostic laparoscopy. The procedure has been discussed with the patient.  Alternative therapies have been discussed with the patient.  Operative risks include bleeding,  Infection,  Organ injury,  Nerve injury,  Blood vessel injury,  DVT,  Pulmonary embolism,  Death,  And possible reoperation.  Medical management risks include worsening of present situation.  The success of the procedure is 50 -90 % at treating patients symptoms.  The patient understands and agrees to proceed.    LOS: 1 day    Debby DELENA Shipper 01/15/2024 High complexity

## 2024-01-15 NOTE — Transfer of Care (Signed)
 Immediate Anesthesia Transfer of Care Note  Patient: [name]  Procedure(s) Performed: LAPAROSCOPY, DIAGNOSTIC, HAND ASSISTED APPENDECTOMY, LAPAROSCOPIC  Patient Location: PACU  Anesthesia Type:General  Level of Consciousness: drowsy and patient cooperative  Airway & Oxygen Therapy: Patient Spontanous Breathing and Patient connected to nasal cannula oxygen  Post-op Assessment: Report given to RN, Post -op Vital signs reviewed and stable, and Patient moving all extremities X 4  Post vital signs: Reviewed and stable  Last Vitals:  Vitals Value Taken Time  BP 131/58 01/15/24 12:19  Temp 36.4 C 01/15/24 12:19  Pulse 121 01/15/24 12:26  Resp 16 01/15/24 12:26  SpO2 94 % 01/15/24 12:26  Vitals shown include unfiled device data.  Last Pain:  Vitals:   01/15/24 0922  TempSrc: Oral  PainSc: 3          Complications: No notable events documented.

## 2024-01-15 NOTE — Interval H&P Note (Signed)
 History and Physical Interval Note:  01/15/2024 10:08 AM  Jayson Bihari  has presented today for surgery, with the diagnosis of ABDOMINAL PAIN.  The various methods of treatment have been discussed with the patient and family. After consideration of risks, benefits and other options for treatment, the patient has consented to  Procedure(s) with comments: LAPAROSCOPY, DIAGNOSTIC (N/A) - POSSIBLE APPENDECTOMY as a surgical intervention.  The patient's history has been reviewed, patient examined, no change in status, stable for surgery.  I have reviewed the patient's chart and labs.  Questions were answered to the patient's satisfaction.   Reviewed surgical options and possibilities given pain.  Discussed the procedure as well as need for open surgery, bowel resection, colon resection, ostomy, and other potential treatments depending on intra-abdominal pathology.  He continues to have pain with elevated white count tachycardia therefore I feel laparoscopy is initial.  Since he has not improved on medical management overnight.The procedure has been discussed with the patient.  Alternative therapies have been discussed with the patient.  Operative risks include bleeding,  Infection,  Organ injury,  Nerve injury,  Blood vessel injury,  DVT,  Pulmonary embolism,  Death,  And possible reoperation.  Medical management risks include worsening of present situation.  The success of the procedure is 50 -90 % at treating patients symptoms.  The patient understands and agrees to proceed.   Kedric Bumgarner A Eluterio Seymour

## 2024-01-15 NOTE — Anesthesia Preprocedure Evaluation (Signed)
 Anesthesia Evaluation  Patient identified by MRN, date of birth, ID band Patient awake    Reviewed: Allergy & Precautions, NPO status , Patient's Chart, lab work & pertinent test results  History of Anesthesia Complications Negative for: history of anesthetic complications  Airway Mallampati: II  TM Distance: >3 FB Neck ROM: Full    Dental no notable dental hx. (+) Teeth Intact   Pulmonary neg pulmonary ROS, neg sleep apnea, neg COPD, Patient abstained from smoking.Not current smoker, former smoker   Pulmonary exam normal breath sounds clear to auscultation       Cardiovascular Exercise Tolerance: Good METShypertension, Pt. on medications + Peripheral Vascular Disease  (-) CAD and (-) Past MI + dysrhythmias Atrial Fibrillation  Rhythm:Regular Rate:Tachycardia - Systolic murmurs New afib with RVR last night; pending workup, but given urgent nature of procedure, unreasonable to delay case for workup, especially given that patient is asymptomatic and hemodynamically stable. Appears to be in a regular rhythm today   Neuro/Psych  Neuromuscular disease  negative psych ROS   GI/Hepatic ,neg GERD  ,,(+)     (-) substance abuse    Endo/Other  diabetes    Renal/GU CRFRenal disease     Musculoskeletal   Abdominal  (+) + obese Abdomen: tender.   Peds  Hematology   Anesthesia Other Findings Past Medical History: 2009: Blood transfusion No date: Diabetes mellitus     Comment:  type 2 No date: Hyperlipemia No date: Hypertension     Comment:  denies No date: Leg swelling No date: Osteomyelitis (HCC)     Comment:  right 5th toe No date: Visual disturbance  Reproductive/Obstetrics                              Anesthesia Physical Anesthesia Plan  ASA: 3  Anesthesia Plan: General   Post-op Pain Management: Ofirmev  IV (intra-op)*   Induction: Intravenous and Rapid sequence  PONV Risk Score  and Plan: 4 or greater and Ondansetron , Dexamethasone  and Midazolam   Airway Management Planned: Oral ETT  Additional Equipment: None  Intra-op Plan:   Post-operative Plan: Extubation in OR  Informed Consent: I have reviewed the patients History and Physical, chart, labs and discussed the procedure including the risks, benefits and alternatives for the proposed anesthesia with the patient or authorized representative who has indicated his/her understanding and acceptance.     Dental advisory given  Plan Discussed with: CRNA and Surgeon  Anesthesia Plan Comments: (Discussed risks of anesthesia with patient, including PONV, sore throat, lip/dental/eye damage. Rare risks discussed as well, such as cardiorespiratory and neurological sequelae, and allergic reactions. Discussed the role of CRNA in patient's perioperative care. Patient understands.)        Anesthesia Quick Evaluation

## 2024-01-15 NOTE — Anesthesia Procedure Notes (Signed)
 Procedure Name: Intubation Date/Time: 01/15/2024 10:39 AM  Performed by: Lamar Lucie DASEN, CRNAPre-anesthesia Checklist: Patient identified, Emergency Drugs available, Suction available and Patient being monitored Patient Re-evaluated:Patient Re-evaluated prior to induction Oxygen Delivery Method: Circle system utilized Preoxygenation: Pre-oxygenation with 100% oxygen Induction Type: IV induction and Rapid sequence Laryngoscope Size: Mac and 4 Grade View: Grade I Tube type: Oral Tube size: 7.0 mm Number of attempts: 1 Airway Equipment and Method: Stylet and Oral airway Placement Confirmation: ETT inserted through vocal cords under direct vision, positive ETCO2 and breath sounds checked- equal and bilateral Secured at: 22 cm Tube secured with: Tape Dental Injury: Teeth and Oropharynx as per pre-operative assessment

## 2024-01-15 NOTE — Progress Notes (Signed)
 Alerted about patient's telemetry showing AFIB with RVR. His telemetry does show AFIB, with rates hovering 140+. He is asymptomatic, and does not endorse chest pain or palpitations. Cardiac exam does reveal irregular rhythm and tachycardia. He does not have any history of atrial fibrillation, per chart review. New onset AFIB with RVR likely in the setting of perforated viscus and sepsis, discussed with cardiology and recommended holding any medical management for now as this is a response to his sepsis and not wanting to shunt cardiac output. If pt starts developing chest pain, shortness of breath, or starts to become unstable, would start amio.   Per last surgery note, no plans for surgery as of yet for perforated viscus, but if he does become unstable and he does need surgery will need to start anti-arrhythmic medication.   Plan:  - Will check echo in the morning  - Holding on anti-arrhythmics for now

## 2024-01-15 NOTE — Progress Notes (Addendum)
 Pharmacy Antibiotic Note  [name] is a 63 y.o. male admitted on 01/14/2024 with sepsis.  Pharmacy has been consulted for Vancomycin  dosing. Pt also on Rocephin  and Flagyl .  01/15/24: Vancomycin  random level 7 mcg/mL (checked ~26 hours after vancomycin  2gm load), below goal trough of 15-20 mcg/mL. WBC decreased to 15, lactate 1.2, afebrile. Patient with AKI on admission. Scr improving to 2.06 today. Noted urine culture containing >100,000 cfus of GPCs, pending identification and susceptibilities. Blood culture remains ngtd.  Plan: Vancomycin  1000 mg IV x1 Recheck vancomycin  random level in 24 hours Will f/u Scr in a.m. for further Vanc dosing  Height: 5' 9 (175.3 cm) Weight: 110.7 kg (244 lb) IBW/kg (Calculated) : 70.7  Temp (24hrs), Avg:99.1 F (37.3 C), Min:97.5 F (36.4 C), Max:101.2 F (38.4 C)  Recent Labs  Lab 01/14/24 1342 01/14/24 1357 01/14/24 1619 01/14/24 2000 01/15/24 0330 01/15/24 1849  WBC 18.9*  --   --   --  15.0*  --   CREATININE 2.47*  --   --   --  2.06*  --   LATICACIDVEN  --  5.5* 2.4* 1.4 1.2  --   VANCORANDOM  --   --   --   --   --  7    Estimated Creatinine Clearance: 44.4 mL/min (A) (by C-G formula based on SCr of 2.06 mg/dL (H)).    No Known Allergies  Antimicrobials this admission: 7/31 Zosyn  x 1 7/31 Vanc >>  7/31 Rocephin  >>  7/31 Flagyl  >>  Microbiology results: 7/31 BCx: ngtd 7/31 Urine Cx: >100K cfus GPCs  Thank you for allowing pharmacy to be a part of this patient's care.  Morna Breach, PharmD PGY2 Cardiology Pharmacy Resident 01/15/2024 8:13 PM

## 2024-01-15 NOTE — Progress Notes (Signed)
 HD#1 SUBJECTIVE:  Patient Summary:  [name] is a 63 y.o. 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  Overnight Events:  Patient noted to be in AFIB with RVR on telemetry (rates ~140s), asymptomatic without chest pain or palpitations. Exam shows irregular rhythm and tachycardia. No prior AFIB per chart review. Likely new-onset AFIB secondary to sepsis from perforated viscus. Cardiology recommends holding anti-arrhythmics for now to preserve cardiac output. Will initiate amiodarone if patient becomes symptomatic or unstable. Per surgery, no operative plans at this time. If instability develops and surgery is needed, anti-arrhythmic therapy will be started.  Interim History:  He reports sleeping well but has persistent lower abdominal pain that began the day before he started vomiting black material. Denies nausea, diarrhea, or changes in urination. He does not feel feverish, though a temperature of 101F was noted. He denies chest pain or shortness of breath. Surgery PA mentioned plans for surgery before our visit.  OBJECTIVE:  Vital Signs: Vitals:   01/14/24 2330 01/15/24 0000 01/15/24 0030 01/15/24 0114  BP: (!) 146/84 135/76 (!) 155/83 (!) 147/71  Pulse: (!) 121 (!) 123 (!) 121   Resp: 15 (!) 7 14 (!) 21  Temp:   (!) 101.2 F (38.4 C) 98.8 F (37.1 C)  TempSrc:   Oral Oral  SpO2: 99% 98% 98% 98%  Weight:      Height:       Supplemental O2: Room Air SpO2: 98 %  Filed Weights   01/14/24 1634  Weight: 110.7 kg     Intake/Output Summary (Last 24 hours) at 01/15/2024 0708 Last data filed at 01/15/2024 0500 Gross per 24 hour  Intake --  Output 1100 ml  Net -1100 ml   Net IO Since Admission: -1,100 mL [01/15/24 0708]  Physical Exam: Physical Exam General appearance: fatigued and mild  distress Eyes: negative findings: conjunctivae and sclerae normal and pupils equal, round, reactive to light and accomodation hroat: lips, mucosa, and tongue normal; teeth and gums normal Neck: no adenopathy and supple, symmetrical, trachea midline Resp: clear to auscultation bilaterally Cardio: Tachycardia Abdomen: hypoactive bowel sounds and diffuse abd tenderness  Patient Lines/Drains/Airways Status     Active Line/Drains/Airways     Name Placement date Placement time Site Days   Peripheral IV 01/14/24 18 G 1.16 Anterior;Right Forearm 01/14/24  1340  Forearm  1   Peripheral IV 01/14/24 20 G 1 Anterior;Left Forearm 01/14/24  1635  Forearm  1   External Urinary Catheter 01/15/24  0100  --  less than 1            Pertinent labs and imaging:      Latest Ref Rng & Units 01/15/2024    3:30 AM 01/14/2024    1:42 PM 04/29/2022    4:44 AM  CBC  WBC 4.0 - 10.5 K/uL 15.0  18.9  12.2   Hemoglobin 13.0 - 17.0 g/dL 87.8  86.4  8.6   Hematocrit 39.0 - 52.0 % 38.7  44.0  28.7   Platelets 150 - 400 K/uL 233  296  309        Latest Ref Rng & Units 01/15/2024    3:30 AM 01/14/2024    1:42 PM 04/29/2022    4:44 AM  CMP  Glucose 70 - 99 mg/dL 846  240  119   BUN 8 - 23 mg/dL 31  36  21   Creatinine 0.61 - 1.24 mg/dL 7.93  7.52  8.59   Sodium 135 - 145 mmol/L 141  140  140   Potassium 3.5 - 5.1 mmol/L 3.5  3.5  4.2   Chloride 98 - 111 mmol/L 108  102  106   CO2 22 - 32 mmol/L 23  23  28    Calcium 8.9 - 10.3 mg/dL 8.3  8.7  8.4   Total Protein 6.5 - 8.1 g/dL  7.4    Total Bilirubin 0.0 - 1.2 mg/dL  0.7    Alkaline Phos 38 - 126 U/L  63    AST 15 - 41 U/L  50    ALT 0 - 44 U/L  21      CT ABDOMEN PELVIS WO CONTRAST Result Date: 01/14/2024 CLINICAL DATA:  Sepsis EXAM: CT ABDOMEN AND PELVIS WITHOUT CONTRAST TECHNIQUE: Multidetector CT imaging of the abdomen and pelvis was performed following the standard protocol without IV contrast. RADIATION DOSE REDUCTION: This exam was performed  according to the departmental dose-optimization program which includes automated exposure control, adjustment of the mA and/or kV according to patient size and/or use of iterative reconstruction technique. COMPARISON:  CT abdomen and pelvis 08/21/2023 FINDINGS: Lower chest: No acute abnormality. Hepatobiliary: No focal liver abnormality is seen. Status post cholecystectomy. No biliary dilatation. Pancreas: Unremarkable. No pancreatic ductal dilatation or surrounding inflammatory changes. Spleen: Normal in size without focal abnormality. Adrenals/Urinary Tract: There is mild nonspecific bilateral perinephric fat stranding. There is no hydronephrosis or urinary tract calculus. The adrenal glands and bladder are within normal limits Stomach/Bowel: There is wall thickening of the cecum and ascending colon as well as terminal ileum. There are matted small bowel loops in the right lower quadrant with wall thickening and surrounding inflammation. There is dilated appendix measuring 10 cm image 6/23. There is marked mesenteric edema and interloop fluid in the right lower quadrant. This is not definitively centered surrounding the appendix. There is a questionable tiny focus of free air in the right lower quadrant adjacent to terminal ileum image 6/39. No evidence for bowel obstruction there is wall thickening of the distal esophagus. The stomach is within normal limits. Vascular/Lymphatic: Aorta and IVC are normal in size. There are atherosclerotic calcifications of the aorta. There are multiple nonenlarged retroperitoneal and central mesenteric lymph nodes. Reproductive: Prostate is unremarkable. Other: There is a small fat containing umbilical hernia. Musculoskeletal: No acute or significant osseous findings. IMPRESSION: 1. Marked inflammatory process in the right lower quadrant with wall thickening of the cecum, ascending colon, terminal ileum and matted small bowel loops. There is marked mesenteric edema and interloop  fluid. There is a questionable tiny focus of free air in the right lower quadrant adjacent to the terminal ileum. Findings are concerning for perforated viscus, possibly from the terminal ileum. 2. The appendix is dilated measuring 10 cm. Findings may be related to appendicitis or reactive change. 3. Wall thickening of the distal esophagus may represent esophagitis. 4. Aortic atherosclerosis. Aortic Atherosclerosis (ICD10-I70.0). Electronically Signed   By: Greig Pique M.D.   On: 01/14/2024 15:36   DG Chest Portable 1 View Result Date: 01/14/2024 CLINICAL DATA:  ?pna.  Unwitnessed fall.  Weakness. EXAM: PORTABLE CHEST 1 VIEW COMPARISON:  11/13/2011. FINDINGS: Please note portions of bilateral lung bases/lateral costophrenic angles is not included in the film. Low lung volume. Bilateral lung fields are clear. Bilateral costophrenic angles are clear.  Normal cardio-mediastinal silhouette. No acute osseous abnormalities. The soft tissues are within normal limits. IMPRESSION: No active disease. Electronically Signed   By: Ree Molt M.D.   On: 01/14/2024 14:03    ASSESSMENT/PLAN:  Assessment: Principal Problem:   Sepsis (HCC) Active Problems:   HTN (hypertension)   AKI (acute kidney injury) (HCC)   Acute appendicitis   Type 2 diabetes mellitus with circulatory disorder (HCC)   Hx of left BKA (HCC)   Lactic acidosis   Rhabdomyolysis   Plan:  #Sepsis #Perforated appendix with peritonitis #Lactic acidosis Sepsis likely secondary to intra-abdominal source. Patient with tachycardia and tachypnea (HR 120, RR 20) and leukocytosis (WBC peaked at 18.9, now 15). CT abdomen concerning for perforated viscus near the ileum, dilated appendix (10 cm) concerning for appendicitis, with associated bowel wall thickening, possible free air. Now afebrile (T 97.5). -General surgery did diagnostic laparoscopy: Perforated appendix with peritonitis, interloop abscesses of the ileum,  -metronidazole , ceftriaxone ,  vancomycin  -Blood cultures pending right lower quadrant abscess  -Monitor vitals, WBC, lactate, renal functions  #Acute kidney injury (AKI)  Likely multifactorial: sepsis-related hypoperfusion, possible dehydration, or rhabdomyolysis. - lactated ringers  infusion at 125 mL/hr, Intravenous, Continuous  #Rhabdomyolysis Rhabdomyolysis likely due to prolonged immobility. CK elevated at 4583. Electrolytes stable. -Continue IV fluids, lactated ringers  infusion at 125 mL/hr, Intravenous, Continuous -Monitor CK, renal function, electrolytes, and urine output  #AFIB with RVR on telemetry  Patient noted to be in AFIB with RVR on telemetry (rates ~140s), asymptomatic without chest pain or palpitations. Exam shows irregular rhythm and tachycardia. No prior AFIB per chart review. Likely new-onset AFIB secondary to sepsis from perforated viscus. Cardiology recommends holding anti-arrhythmics for now to preserve cardiac output. Will initiate amiodarone if patient becomes symptomatic or unstable. Per surgery, no operative plans at this time. If instability develops and surgery is needed, anti-arrhythmic therapy will be started. Plan: -Echo was not done due to surgery -Hold anti-arrhythmics for now  #Fall:  Found on floor after mechanical fall from wheelchair, likely overnight. Too weak to get up. Denies trauma, LOC, or head injury. Underwent surgery for perforated viscus. Will follow with OT/PT. -oxycodone  -PT/OT  #Hypertension Blood pressures elevated currently.  -continue holding off on home antihypertensive with concern for sepsis. - Monitor blood pressure closely  #Diabetes Patient with type 2 diabetes, glucose 131.  Last A1c over 1 year ago 5.8. -Hold home Lantus  20 units nightly and also started on ozepmic op -Start sliding scale insulin  -monitor blood glucose levels closely  #Hematemesis Patient did have some concern for hematemesis per EMS report. On my exam yesterday, did have some  dried blood on his mouth.  Today patient denied vomiting. -Monitor clinically -start Protonix  40 mg bid    Best Practice: Diet: NPO IVF: Fluids: LR, Rate: 100 cc/hr x   hrs VTE: heparin  injection 5,000 Units Start: 01/14/24 2200 Code: Full  Disposition planning: Therapy Recs: None, DME: wheelchair DISPO: Anticipated discharge in 5 days to Home pending clinical improvement.  Signature:  Armando Bernadine [facility]  7:08 AM, 01/15/2024  On Call pager [phone]

## 2024-01-15 NOTE — H&P (View-Only) (Signed)
 Subjective/Chief Complaint: Patient is more awake today.  Still complaining of right lower quadrant abdominal pain.  It is about the same as yesterday.  Still with tachycardia.   Objective: Vital signs in last 24 hours: Temp:  [98.8 F (37.1 C)-101.8 F (38.8 C)] 98.8 F (37.1 C) (08/01 0114) Pulse Rate:  [120-136] 121 (08/01 0030) Resp:  [7-39] 21 (08/01 0114) BP: (104-182)/(63-96) 147/71 (08/01 0114) SpO2:  [95 %-100 %] 98 % (08/01 0114) Weight:  [110.7 kg] 110.7 kg (07/31 1634)    Intake/Output from previous day: 07/31 0701 - 08/01 0700 In: -  Out: 1100 [Urine:1100] Intake/Output this shift: No intake/output data recorded.  Abdomen: Tender right lower quadrant but soft.  Lab Results:  Recent Labs    01/14/24 1342 01/15/24 0330  WBC 18.9* 15.0*  HGB 13.5 12.1*  HCT 44.0 38.7*  PLT 296 233   BMET Recent Labs    01/14/24 1342 01/15/24 0330  NA 140 141  K 3.5 3.5  CL 102 108  CO2 23 23  GLUCOSE 240* 153*  BUN 36* 31*  CREATININE 2.47* 2.06*  CALCIUM 8.7* 8.3*   PT/INR Recent Labs    01/14/24 1350  LABPROT 18.4*  INR 1.5*   ABG No results for input(s): PHART, HCO3 in the last 72 hours.  Invalid input(s): PCO2, PO2  Studies/Results: CT ABDOMEN PELVIS WO CONTRAST Result Date: 01/14/2024 CLINICAL DATA:  Sepsis EXAM: CT ABDOMEN AND PELVIS WITHOUT CONTRAST TECHNIQUE: Multidetector CT imaging of the abdomen and pelvis was performed following the standard protocol without IV contrast. RADIATION DOSE REDUCTION: This exam was performed according to the departmental dose-optimization program which includes automated exposure control, adjustment of the mA and/or kV according to patient size and/or use of iterative reconstruction technique. COMPARISON:  CT abdomen and pelvis 08/21/2023 FINDINGS: Lower chest: No acute abnormality. Hepatobiliary: No focal liver abnormality is seen. Status post cholecystectomy. No biliary dilatation. Pancreas:  Unremarkable. No pancreatic ductal dilatation or surrounding inflammatory changes. Spleen: Normal in size without focal abnormality. Adrenals/Urinary Tract: There is mild nonspecific bilateral perinephric fat stranding. There is no hydronephrosis or urinary tract calculus. The adrenal glands and bladder are within normal limits Stomach/Bowel: There is wall thickening of the cecum and ascending colon as well as terminal ileum. There are matted small bowel loops in the right lower quadrant with wall thickening and surrounding inflammation. There is dilated appendix measuring 10 cm image 6/23. There is marked mesenteric edema and interloop fluid in the right lower quadrant. This is not definitively centered surrounding the appendix. There is a questionable tiny focus of free air in the right lower quadrant adjacent to terminal ileum image 6/39. No evidence for bowel obstruction there is wall thickening of the distal esophagus. The stomach is within normal limits. Vascular/Lymphatic: Aorta and IVC are normal in size. There are atherosclerotic calcifications of the aorta. There are multiple nonenlarged retroperitoneal and central mesenteric lymph nodes. Reproductive: Prostate is unremarkable. Other: There is a small fat containing umbilical hernia. Musculoskeletal: No acute or significant osseous findings. IMPRESSION: 1. Marked inflammatory process in the right lower quadrant with wall thickening of the cecum, ascending colon, terminal ileum and matted small bowel loops. There is marked mesenteric edema and interloop fluid. There is a questionable tiny focus of free air in the right lower quadrant adjacent to the terminal ileum. Findings are concerning for perforated viscus, possibly from the terminal ileum. 2. The appendix is dilated measuring 10 cm. Findings may be related to appendicitis or reactive change.  3. Wall thickening of the distal esophagus may represent esophagitis. 4. Aortic atherosclerosis. Aortic  Atherosclerosis (ICD10-I70.0). Electronically Signed   By: Greig Pique M.D.   On: 01/14/2024 15:36   DG Chest Portable 1 View Result Date: 01/14/2024 CLINICAL DATA:  ?pna.  Unwitnessed fall.  Weakness. EXAM: PORTABLE CHEST 1 VIEW COMPARISON:  11/13/2011. FINDINGS: Please note portions of bilateral lung bases/lateral costophrenic angles is not included in the film. Low lung volume. Bilateral lung fields are clear. Bilateral costophrenic angles are clear. Normal cardio-mediastinal silhouette. No acute osseous abnormalities. The soft tissues are within normal limits. IMPRESSION: No active disease. Electronically Signed   By: Ree Molt M.D.   On: 01/14/2024 14:03    Anti-infectives: Anti-infectives (From admission, onward)    Start     Dose/Rate Route Frequency Ordered Stop   01/15/24 1000  cefTRIAXone  (ROCEPHIN ) 2 g in sodium chloride  0.9 % 100 mL IVPB        2 g 200 mL/hr over 30 Minutes Intravenous Every 24 hours 01/14/24 1626 01/25/24 0959   01/14/24 2200  metroNIDAZOLE  (FLAGYL ) IVPB 500 mg        500 mg 100 mL/hr over 60 Minutes Intravenous Every 12 hours 01/14/24 1626 01/24/24 2159   01/14/24 1657  vancomycin  variable dose per unstable renal function (pharmacist dosing)         Does not apply See admin instructions 01/14/24 1658     01/14/24 1515  piperacillin -tazobactam (ZOSYN ) IVPB 3.375 g        3.375 g 100 mL/hr over 30 Minutes Intravenous STAT 01/14/24 1509 01/14/24 1641   01/14/24 1515  vancomycin  (VANCOREADY) IVPB 2000 mg/400 mL        2,000 mg 200 mL/hr over 120 Minutes Intravenous  Once 01/14/24 1511 01/14/24 1854       Assessment/Plan: Abdominal pain-given his continued abdominal pain recommend diagnostic laparoscopy with possible laparotomy.  Reviewing his CT scan his appendix is abnormal and I suspect he has acute appendicitis but I also discussed with him other possibilities of cecal diverticulitis, sigmoid diverticulitis, small bowel pathology, and the need for  other treatment standard procedures depending on laparoscopic findings.  I reviewed bowel resection and colon resection with him this morning.  I did briefly discussed ostomy though I suspect he will not need one but again I discussed that with him today.  After discussing options of surgery versus continue antibiotic care, he is agreed to proceed with diagnostic laparoscopy. The procedure has been discussed with the patient.  Alternative therapies have been discussed with the patient.  Operative risks include bleeding,  Infection,  Organ injury,  Nerve injury,  Blood vessel injury,  DVT,  Pulmonary embolism,  Death,  And possible reoperation.  Medical management risks include worsening of present situation.  The success of the procedure is 50 -90 % at treating patients symptoms.  The patient understands and agrees to proceed.    LOS: 1 day    [name] 01/15/2024 High complexity

## 2024-01-15 NOTE — Anesthesia Postprocedure Evaluation (Signed)
 Anesthesia Post Note  Patient: [name]  Procedure(s) Performed: LAPAROSCOPY, DIAGNOSTIC, HAND ASSISTED APPENDECTOMY, LAPAROSCOPIC     Patient location during evaluation: PACU Anesthesia Type: General Level of consciousness: awake and alert Pain management: pain level controlled Vital Signs Assessment: post-procedure vital signs reviewed and stable Respiratory status: spontaneous breathing, nonlabored ventilation, respiratory function stable and patient connected to nasal cannula oxygen Cardiovascular status: blood pressure returned to baseline and stable Postop Assessment: no apparent nausea or vomiting Anesthetic complications: no   No notable events documented.  Last Vitals:  Vitals:   01/15/24 1245 01/15/24 1300  BP: (!) 144/68 134/69  Pulse: (!) 122 (!) 121  Resp: 17 14  Temp:    SpO2: 94% 94%    Last Pain:  Vitals:   01/15/24 1300  TempSrc:   PainSc: Asleep                 [name]

## 2024-01-16 ENCOUNTER — Inpatient Hospital Stay (HOSPITAL_COMMUNITY)

## 2024-01-16 ENCOUNTER — Encounter (HOSPITAL_COMMUNITY): Payer: Self-pay | Admitting: Surgery

## 2024-01-16 DIAGNOSIS — K3532 Acute appendicitis with perforation and localized peritonitis, without abscess: Secondary | ICD-10-CM

## 2024-01-16 LAB — ECHOCARDIOGRAM COMPLETE
Height: 69 in
S' Lateral: 3 cm
Weight: 3904 [oz_av]

## 2024-01-16 LAB — GLUCOSE, CAPILLARY
Glucose-Capillary: 163 mg/dL — ABNORMAL HIGH (ref 70–99)
Glucose-Capillary: 172 mg/dL — ABNORMAL HIGH (ref 70–99)
Glucose-Capillary: 135 mg/dL — ABNORMAL HIGH (ref 70–99)
Glucose-Capillary: 148 mg/dL — ABNORMAL HIGH (ref 70–99)
Glucose-Capillary: 141 mg/dL — ABNORMAL HIGH (ref 70–99)

## 2024-01-16 LAB — BASIC METABOLIC PANEL WITH GFR
Anion gap: 9 (ref 5–15)
BUN: 28 mg/dL — ABNORMAL HIGH (ref 8–23)
CO2: 23 mmol/L (ref 22–32)
Calcium: 8.3 mg/dL — ABNORMAL LOW (ref 8.9–10.3)
Chloride: 112 mmol/L — ABNORMAL HIGH (ref 98–111)
Creatinine, Ser: 1.99 mg/dL — ABNORMAL HIGH (ref 0.61–1.24)
GFR, Estimated: 37 mL/min — ABNORMAL LOW (ref >60)
Glucose, Bld: 188 mg/dL — ABNORMAL HIGH (ref 70–99)
Potassium: 3.8 mmol/L (ref 3.5–5.1)
Sodium: 144 mmol/L (ref 135–145)

## 2024-01-16 LAB — CBC
HCT: 37.9 % — ABNORMAL LOW (ref 39.0–52.0)
Hemoglobin: 11.6 g/dL — ABNORMAL LOW (ref 13.0–17.0)
MCH: 24.7 pg — ABNORMAL LOW (ref 26.0–34.0)
MCHC: 30.6 g/dL (ref 30.0–36.0)
MCV: 80.6 fL (ref 80.0–100.0)
Platelets: 227 K/uL (ref 150–400)
RBC: 4.7 MIL/uL (ref 4.22–5.81)
RDW: 16 % — ABNORMAL HIGH (ref 11.5–15.5)
WBC: 13.1 K/uL — ABNORMAL HIGH (ref 4.0–10.5)
nRBC: 0 % (ref 0.0–0.2)

## 2024-01-16 LAB — URINE CULTURE: Culture: >=100000 — AB

## 2024-01-16 LAB — CK: Total CK: 2654 U/L — ABNORMAL HIGH (ref 49–397)

## 2024-01-16 MED ORDER — METOPROLOL TARTRATE 5 MG/5ML IV SOLN
5.0000 mg | Freq: Once | INTRAVENOUS | Status: AC
  Administered 2024-01-16: 5 mg via INTRAVENOUS

## 2024-01-16 MED ORDER — METOPROLOL TARTRATE 5 MG/5ML IV SOLN
5.0000 mg | Freq: Once | INTRAVENOUS | Status: AC
  Administered 2024-01-16: 5 mg via INTRAVENOUS
  Filled 2024-01-16: qty 5

## 2024-01-16 MED ORDER — METOPROLOL TARTRATE 5 MG/5ML IV SOLN
10.0000 mg | Freq: Once | INTRAVENOUS | Status: AC
  Administered 2024-01-16: 10 mg via INTRAVENOUS

## 2024-01-16 MED ORDER — PERFLUTREN LIPID MICROSPHERE
1.0000 mL | INTRAVENOUS | Status: AC | PRN
  Administered 2024-01-16: 3 mL via INTRAVENOUS

## 2024-01-16 MED ORDER — METOPROLOL TARTRATE 5 MG/5ML IV SOLN
10.0000 mg | Freq: Four times a day (QID) | INTRAVENOUS | Status: DC
  Administered 2024-01-17 – 2024-01-18 (×6): 10 mg via INTRAVENOUS
  Filled 2024-01-16 (×5): qty 10

## 2024-01-16 MED ORDER — METOPROLOL TARTRATE 5 MG/5ML IV SOLN
5.0000 mg | Freq: Four times a day (QID) | INTRAVENOUS | Status: DC
  Administered 2024-01-16 (×2): 5 mg via INTRAVENOUS
  Filled 2024-01-16 (×3): qty 5

## 2024-01-16 MED ORDER — ONDANSETRON HCL 4 MG/2ML IJ SOLN
4.0000 mg | Freq: Four times a day (QID) | INTRAMUSCULAR | Status: DC | PRN
  Administered 2024-01-17: 4 mg via INTRAVENOUS
  Filled 2024-01-16: qty 2

## 2024-01-16 MED ORDER — METOPROLOL TARTRATE 5 MG/5ML IV SOLN
10.0000 mg | Freq: Once | INTRAVENOUS | Status: AC
  Administered 2024-01-16: 10 mg via INTRAVENOUS
  Filled 2024-01-16: qty 10

## 2024-01-16 MED ORDER — PIPERACILLIN-TAZOBACTAM 3.375 G IVPB
3.3750 g | Freq: Three times a day (TID) | INTRAVENOUS | Status: DC
  Administered 2024-01-16 – 2024-01-19 (×8): 3.375 g via INTRAVENOUS
  Filled 2024-01-16 (×7): qty 50

## 2024-01-16 NOTE — Progress Notes (Signed)
 Revaluated patient and he has converted back to sinus rhythm. He is still tachycardic. Will continue with metoprolol  pushes as he is NPO.

## 2024-01-16 NOTE — Progress Notes (Signed)
 HD#2 Subjective:   Summary: This is a 63 year old gentleman with a past medical history of diabetes, status post left BKA who presented to the emergency department with abdominal pain after falling off of his wheelchair.  Patient found to be septic likely from a perforated appendix and admitted for further evaluation and management.  Overnight Events: No acute events overnight.  Patient is postop day 1.  Evaluated bedside this morning.  He states he is hungry and wants to move.  He reports having abdominal pain.  He denies any shortness of breath or chest pain  Objective:  Vital signs in last 24 hours: Vitals:   01/16/24 0330 01/16/24 0505 01/16/24 0555 01/16/24 0600  BP: (!) 171/88 (!) 155/75 (!) 156/66   Pulse: (!) 137 68 63   Resp: 19 (!) 23 (!) 24   Temp:      TempSrc:      SpO2: 92% 94% 96% 98%  Weight:      Height:       Supplemental O2: Nasal Cannula SpO2: 98 % O2 Flow Rate (L/min): 2 L/min   Physical Exam:  Constitutional: Resting in bed, no acute distress, NG tube in place HENT: NG tube in place Cardiovascular: Irregularly irregular rhythm Pulmonary/Chest: normal work of breathing on room air, lungs clear to auscultation bilaterally Abdominal: soft, with 1 drain in place draining pink-tinged fluid, tape in place over the incisions, some tenderness to lower quadrants  Filed Weights   01/14/24 1634  Weight: 110.7 kg     Intake/Output Summary (Last 24 hours) at 01/16/2024 0617 Last data filed at 01/16/2024 0500 Gross per 24 hour  Intake 850 ml  Output 682 ml  Net 168 ml   Net IO Since Admission: -932 mL [01/16/24 0617]  Pertinent Labs:    Latest Ref Rng & Units 01/16/2024    3:41 AM 01/15/2024    3:30 AM 01/14/2024    1:42 PM  CBC  WBC 4.0 - 10.5 K/uL 13.1  15.0  18.9   Hemoglobin 13.0 - 17.0 g/dL 88.3  87.8  86.4   Hematocrit 39.0 - 52.0 % 37.9  38.7  44.0   Platelets 150 - 400 K/uL 227  233  296        Latest Ref Rng & Units 01/16/2024    3:41 AM  01/15/2024    3:30 AM 01/14/2024    1:42 PM  CMP  Glucose 70 - 99 mg/dL 811  846  759   BUN 8 - 23 mg/dL 28  31  36   Creatinine 0.61 - 1.24 mg/dL 8.00  7.93  7.52   Sodium 135 - 145 mmol/L 144  141  140   Potassium 3.5 - 5.1 mmol/L 3.8  3.5  3.5   Chloride 98 - 111 mmol/L 112  108  102   CO2 22 - 32 mmol/L 23  23  23    Calcium 8.9 - 10.3 mg/dL 8.3  8.3  8.7   Total Protein 6.5 - 8.1 g/dL   7.4   Total Bilirubin 0.0 - 1.2 mg/dL   0.7   Alkaline Phos 38 - 126 U/L   63   AST 15 - 41 U/L   50   ALT 0 - 44 U/L   21     Imaging: X-ray abdomen AP Result Date: 01/15/2024 CLINICAL DATA:  Confirm nasogastric tube placement EXAM: ABDOMEN - 1 VIEW COMPARISON:  None Available. FINDINGS: The bowel gas pattern is normal. Enteric tube tip terminates  in gastric fundus. Cholecystectomy clips in right upper quadrant. No radio-opaque calculi or other significant radiographic abnormality are seen. Degenerative changes of the spine. IMPRESSION: Proper position of enteric tube. Electronically Signed   By: Megan  Zare M.D.   On: 01/15/2024 13:06    Assessment/Plan:   Principal Problem:   Sepsis (HCC) Active Problems:   HTN (hypertension)   AKI (acute kidney injury) (HCC)   Acute appendicitis   Type 2 diabetes mellitus with circulatory disorder (HCC)   Hx of left BKA (HCC)   Lactic acidosis   Rhabdomyolysis   Colitis   Patient Summary: [name] is a 63 y.o. gentleman with a past medical history of diabetes, status post left BKA who presented to the emergency department with abdominal pain after falling off of his wheelchair.  Patient found to be septic likely from a perforated appendix and admitted for further evaluation and management.  #Sepsis #Perforated appendix Patient is postop day 1.  He was found to have perforated appendix after diagnostic laparoscopy.  Surgery is following.  Currently NPO.  Currently on fluids.  Continue Flagyl , ceftriaxone , vancomycin .  Currently has NG tube in place.   Can hold p.o. meds. - Antibiotics day 2 - Surgery following, appreciate recommendation - N.p.o. until cleared by surgery - Monitor for further decompensation   #Atrial fibrillation Seems to be converting in and out of A-fib and a flutter as well as sinus.  Can try metoprolol  pushes today to see if patient can tolerate this.  If patient remains in RVR, will need to start amiodarone  drip as patient cannot take any orals - Start metoprolol  pushes today every 6 hours - Monitor on telemetry - Do not think he needs anticoagulation at this time as this is chronic  #AKI  Patient has creatinine elevated to 1.99 down from 2.06 yesterday.  This is likely in setting of volume depletion as well as rhabdomyolysis.  Will continue fluid resuscitation. - Continue IV fluids - Monitor BMP closely - Monitor urine output  #Rhabdomyolysis CK is trending down well.  Last CK down to 2645 from 4000.  Will continue with fluid resuscitation - Trend CK - Continue fluid resuscitation  #Type 2 diabetes mellitus Glucose measuring between 150-160. -Continue sliding scale insulin   Diet: NPO IVF: LR,100cc/hr VTE: Heparin  Code: Full  Dispo: Anticipated discharge to Home in 3 days pending clinical improvement.   Libby Blanch DO Internal Medicine Resident PGY-3 Please contact the on call pager after 5 pm and on weekends at [phone].

## 2024-01-16 NOTE — Progress Notes (Signed)
 D/w Dr. Libby, we will optimize abx regimen to zosyn  3.375g IV q8 to cover for intra-abd infection.  Sergio Batch, PharmD, BCIDP, AAHIVP, CPP Infectious Disease Pharmacist 01/16/2024 1:08 PM

## 2024-01-16 NOTE — Evaluation (Signed)
 Physical Therapy Evaluation Patient Details Name: [name] MRN: [id number] DOB: Jun 16, 1961 Today's Date: 01/16/2024  History of Present Illness  63 y.o. male admitted 01/14/24 with abdominal pain after being found down at home. Workup for septic shock, AKI, appendicitis. S/p laparoscopic appendectomy with findings of peritoneal abscess and diffuse purulent peritonitis on 8/1. PMH includes L BKA (2022), R transmetatarsal amputation, HTN, DM.   Clinical Impression  Pt presents with an overall decrease in functional mobility secondary to above. PTA, pt mod indep with manual and electric w/c via pivot transfers, lives alone, uses SCAT. Today, pt requiring mod-maxA for bed mobility and scooting transfers. Pt limited by generalized weakness, abdominal pain, decreased activity tolerance and impaired balance strategies. Pt would benefit from SNF-level therapies to maximize functional mobility and independence prior to d/c home. Will follow acutely to address established goals.       If plan is discharge home, recommend the following: A lot of help with walking and/or transfers;A lot of help with bathing/dressing/bathroom;Assistance with cooking/housework;Assist for transportation;Help with stairs or ramp for entrance   Can travel by private vehicle   No    Equipment Recommendations None recommended by PT  Recommendations for Other Services   Occupational Therapy    Functional Status Assessment Patient has had a recent decline in their functional status and demonstrates the ability to make significant improvements in function in a reasonable and predictable amount of time.     Precautions / Restrictions Precautions Precautions: Fall;Other (comment) Recall of Precautions/Restrictions: Intact Precaution/Restrictions Comments: abdominal, NGT, JP drain Restrictions Weight Bearing Restrictions Per Provider Order: No      Mobility  Bed Mobility Overal bed mobility: Needs Assistance Bed  Mobility: Sit to Supine, Rolling, Sidelying to Sit Rolling: Used rails, Mod assist Sidelying to sit: Max assist, HOB elevated, Used rails   Sit to supine: Max assist   General bed mobility comments: modA for rolling R/L with heavy use of bed rail, limited by pain; maxA for trunk elevation and BLE management sidelying<>sit; maxA to scoot up and reposition trunk in bed    Transfers Overall transfer level: Needs assistance                Lateral/Scoot Transfers: Mod assist General transfer comment: lateral scoots along EOB with modA, significant increased time and effort; pt unable to clear buttocks with forward weight translation in order to progress pivot transfers; fatigue and weakness limiting OOB transfer, will require +2 assist    Ambulation/Gait                  Stairs            Wheelchair Mobility     Tilt Bed    Modified Rankin (Stroke Patients Only)       Balance Overall balance assessment: Needs assistance   Sitting balance-Leahy Scale: Fair Sitting balance - Comments: initially reliant on UE support to maintain balance, progressing to no UE support; intermittent minA to prevent posterior LOB with increased fatigue       Standing balance comment: NT                             Pertinent Vitals/Pain Pain Assessment Pain Assessment: Faces Faces Pain Scale: Hurts little more Pain Location: abdomen Pain Descriptors / Indicators: Sore Pain Intervention(s): Monitored during session, Limited activity within patient's tolerance    Home Living Family/patient expects to be discharged to:: Private residence Living Arrangements: Alone  Type of Home: House Home Access: Level entry       Home Layout: One level Home Equipment: Agricultural consultant (2 wheels);Cane - single point;Tub bench;Wheelchair - manual;Wheelchair - power Additional Comments: has not worn LLE prosthetic for at least a few months due to L residual limb swelling     Prior Function Prior Level of Function : Independent/Modified Independent             Mobility Comments: mod indep with maual w/c inside, power w/c outside; stand pivot transfer without DME; sleeps in recliner. SCAT for transportation ADLs Comments: reports mod indep, uses tub bench     Extremity/Trunk Assessment   Upper Extremity Assessment Upper Extremity Assessment: Generalized weakness    Lower Extremity Assessment Lower Extremity Assessment: RLE deficits/detail;LLE deficits/detail RLE Deficits / Details: h/o R TMA; gross hip strength <3/5 unable to perform SLR; gross knee flex/ext 3/5 with near full knee ext LLE Deficits / Details: h/o L BKA lacking full knee extension; hip strength </ 3/5       Communication   Communication Communication: No apparent difficulties    Cognition Arousal: Alert Behavior During Therapy: Flat affect   PT - Cognitive impairments: No family/caregiver present to determine baseline                       PT - Cognition Comments: WFL for majority of simple tasks, though focused on and repeatedly asking about NGT suction requiring intermittent redirection; does not remember where he went to rehab s/p BKA; flat affect, not forthcoming with details or conversation Following commands: Intact       Cueing Cueing Techniques: Verbal cues     General Comments General comments (skin integrity, edema, etc.): educ re: role of acute PT, activity recommendations, abdominal precautions for comfort, importance of mobility, potential d/c needs including SNF recommendation. HR 120s-140s via monitor during session; RN reports something may be wrong with monitor as it reads HR 128 though manual radial pulse 60s, EKG has been done and MD aware    Exercises     Assessment/Plan    PT Assessment Patient needs continued PT services  PT Problem List Decreased strength;Decreased activity tolerance;Decreased balance;Decreased mobility;Decreased knowledge  of use of DME;Decreased knowledge of precautions;Pain;Cardiopulmonary status limiting activity       PT Treatment Interventions DME instruction;Gait training;Functional mobility training;Therapeutic activities;Therapeutic exercise;Balance training;Patient/family education;Wheelchair mobility training    PT Goals (Current goals can be found in the Care Plan section)  Acute Rehab PT Goals Patient Stated Goal: willing to consider post-acute rehab if needed PT Goal Formulation: With patient Time For Goal Achievement: 01/30/24 Potential to Achieve Goals: Good    Frequency Min 2X/week     Co-evaluation               AM-PAC PT 6 Clicks Mobility  Outcome Measure Help needed turning from your back to your side while in a flat bed without using bedrails?: A Lot Help needed moving from lying on your back to sitting on the side of a flat bed without using bedrails?: A Lot Help needed moving to and from a bed to a chair (including a wheelchair)?: A Lot Help needed standing up from a chair using your arms (e.g., wheelchair or bedside chair)?: Total Help needed to walk in hospital room?: Total Help needed climbing 3-5 steps with a railing? : Total 6 Click Score: 9    End of Session   Activity Tolerance: Patient tolerated treatment well;Patient limited by  fatigue Patient left: in bed;with call bell/phone within reach;with bed alarm set Nurse Communication: Mobility status;Need for lift equipment PT Visit Diagnosis: Other abnormalities of gait and mobility (R26.89);Muscle weakness (generalized) (M62.81);Pain    Time: 8747-8673 PT Time Calculation (min) (ACUTE ONLY): 34 min   Charges:   PT Evaluation $PT Eval Moderate Complexity: 1 Mod PT Treatments $Therapeutic Activity: 8-22 mins PT General Charges $$ ACUTE PT VISIT: 1 Visit       Darice Almas, PT, DPT Acute Rehabilitation Services  Personal: Secure Chat Rehab Office: [phone]  Darice LITTIE Almas 01/16/2024, 2:21 PM

## 2024-01-16 NOTE — Progress Notes (Signed)
 Admission screenings completed at this time.  Will need assistance with transportation home at time of discharge.      01/16/24 1600  Discharge Planning  Type of Residence Private residence  Living Arrangements Alone  Home Care Services No  Support Systems Other relatives  Does the patient have any problems obtaining your medications? No  Does the patient have difficulty doing errands alone such as visiting a doctor's office or shopping? Y  Family/patient expects to be discharged to: Private residence  Once discharged, how will the patient get to their discharge location? Public transportation - may need help arranging  Once discharged, how will the patient get to their follow-up appointment? Public transportation - may need help arranging

## 2024-01-16 NOTE — Progress Notes (Signed)
 Notified Letha Cheadle MD & Saad Noorudin MD for abnormal rhythm and heart rate in the 150's. EKG performed. Dawson Tan MD & Saad Noorudin MD  en route to the patient.

## 2024-01-16 NOTE — Progress Notes (Signed)
 1 Day Post-Op   Subjective/Chief Complaint: In A-fib with RVR this morning.  Patient has no specific complaints.  No flatus or bowel movement yet.   Objective: Vital signs in last 24 hours: Temp:  [97.5 F (36.4 C)-99.4 F (37.4 C)] 99 F (37.2 C) (08/02 0304) Pulse Rate:  [63-137] 63 (08/02 0555) Resp:  [14-31] 24 (08/02 0555) BP: (113-176)/(58-88) 156/66 (08/02 0555) SpO2:  [92 %-100 %] 98 % (08/02 0600)    Intake/Output from previous day: 08/01 0701 - 08/02 0700 In: 850 [I.V.:500; IV Piggyback:350] Out: 1332 [Urine:550; Emesis/NG output:650; Drains:132] Intake/Output this shift: No intake/output data recorded.  Alert, no distress Unlabored respirations on nasal cannula NG tube output is light and low volume.  Patient states he has been drinking liquids. Abdomen is soft, mildly distended.  OR dressings are clean and dry.  Drain output is serosanguineous.  Lab Results:  Recent Labs    01/15/24 0330 01/16/24 0341  WBC 15.0* 13.1*  HGB 12.1* 11.6*  HCT 38.7* 37.9*  PLT 233 227   BMET Recent Labs    01/15/24 0330 01/16/24 0341  NA 141 144  K 3.5 3.8  CL 108 112*  CO2 23 23  GLUCOSE 153* 188*  BUN 31* 28*  CREATININE 2.06* 1.99*  CALCIUM 8.3* 8.3*   PT/INR Recent Labs    01/14/24 1350  LABPROT 18.4*  INR 1.5*   ABG No results for input(s): PHART, HCO3 in the last 72 hours.  Invalid input(s): PCO2, PO2  Studies/Results: X-ray abdomen AP Result Date: 01/15/2024 CLINICAL DATA:  Confirm nasogastric tube placement EXAM: ABDOMEN - 1 VIEW COMPARISON:  None Available. FINDINGS: The bowel gas pattern is normal. Enteric tube tip terminates in gastric fundus. Cholecystectomy clips in right upper quadrant. No radio-opaque calculi or other significant radiographic abnormality are seen. Degenerative changes of the spine. IMPRESSION: Proper position of enteric tube. Electronically Signed   By: Megan  Zare M.D.   On: 01/15/2024 13:06   CT ABDOMEN PELVIS WO  CONTRAST Result Date: 01/14/2024 CLINICAL DATA:  Sepsis EXAM: CT ABDOMEN AND PELVIS WITHOUT CONTRAST TECHNIQUE: Multidetector CT imaging of the abdomen and pelvis was performed following the standard protocol without IV contrast. RADIATION DOSE REDUCTION: This exam was performed according to the departmental dose-optimization program which includes automated exposure control, adjustment of the mA and/or kV according to patient size and/or use of iterative reconstruction technique. COMPARISON:  CT abdomen and pelvis 08/21/2023 FINDINGS: Lower chest: No acute abnormality. Hepatobiliary: No focal liver abnormality is seen. Status post cholecystectomy. No biliary dilatation. Pancreas: Unremarkable. No pancreatic ductal dilatation or surrounding inflammatory changes. Spleen: Normal in size without focal abnormality. Adrenals/Urinary Tract: There is mild nonspecific bilateral perinephric fat stranding. There is no hydronephrosis or urinary tract calculus. The adrenal glands and bladder are within normal limits Stomach/Bowel: There is wall thickening of the cecum and ascending colon as well as terminal ileum. There are matted small bowel loops in the right lower quadrant with wall thickening and surrounding inflammation. There is dilated appendix measuring 10 cm image 6/23. There is marked mesenteric edema and interloop fluid in the right lower quadrant. This is not definitively centered surrounding the appendix. There is a questionable tiny focus of free air in the right lower quadrant adjacent to terminal ileum image 6/39. No evidence for bowel obstruction there is wall thickening of the distal esophagus. The stomach is within normal limits. Vascular/Lymphatic: Aorta and IVC are normal in size. There are atherosclerotic calcifications of the aorta. There are multiple  nonenlarged retroperitoneal and central mesenteric lymph nodes. Reproductive: Prostate is unremarkable. Other: There is a small fat containing umbilical  hernia. Musculoskeletal: No acute or significant osseous findings. IMPRESSION: 1. Marked inflammatory process in the right lower quadrant with wall thickening of the cecum, ascending colon, terminal ileum and matted small bowel loops. There is marked mesenteric edema and interloop fluid. There is a questionable tiny focus of free air in the right lower quadrant adjacent to the terminal ileum. Findings are concerning for perforated viscus, possibly from the terminal ileum. 2. The appendix is dilated measuring 10 cm. Findings may be related to appendicitis or reactive change. 3. Wall thickening of the distal esophagus may represent esophagitis. 4. Aortic atherosclerosis. Aortic Atherosclerosis (ICD10-I70.0). Electronically Signed   By: Greig Pique M.D.   On: 01/14/2024 15:36   DG Chest Portable 1 View Result Date: 01/14/2024 CLINICAL DATA:  ?pna.  Unwitnessed fall.  Weakness. EXAM: PORTABLE CHEST 1 VIEW COMPARISON:  11/13/2011. FINDINGS: Please note portions of bilateral lung bases/lateral costophrenic angles is not included in the film. Low lung volume. Bilateral lung fields are clear. Bilateral costophrenic angles are clear. Normal cardio-mediastinal silhouette. No acute osseous abnormalities. The soft tissues are within normal limits. IMPRESSION: No active disease. Electronically Signed   By: Ree Molt M.D.   On: 01/14/2024 14:03    Anti-infectives: Anti-infectives (From admission, onward)    Start     Dose/Rate Route Frequency Ordered Stop   01/15/24 2030  vancomycin  (VANCOCIN ) IVPB 1000 mg/200 mL premix        1,000 mg 200 mL/hr over 60 Minutes Intravenous  Once 01/15/24 2018 01/15/24 2130   01/15/24 1000  cefTRIAXone  (ROCEPHIN ) 2 g in sodium chloride  0.9 % 100 mL IVPB        2 g 200 mL/hr over 30 Minutes Intravenous Every 24 hours 01/14/24 1626 01/25/24 0959   01/14/24 2200  metroNIDAZOLE  (FLAGYL ) IVPB 500 mg        500 mg 100 mL/hr over 60 Minutes Intravenous Every 12 hours 01/14/24  1626 01/24/24 2159   01/14/24 1657  vancomycin  variable dose per unstable renal function (pharmacist dosing)         Does not apply See admin instructions 01/14/24 1658     01/14/24 1515  piperacillin -tazobactam (ZOSYN ) IVPB 3.375 g        3.375 g 100 mL/hr over 30 Minutes Intravenous STAT 01/14/24 1509 01/14/24 1641   01/14/24 1515  vancomycin  (VANCOREADY) IVPB 2000 mg/400 mL        2,000 mg 200 mL/hr over 120 Minutes Intravenous  Once 01/14/24 1511 01/14/24 1854       Assessment/Plan:  63 year old male with history of hypertension and diabetes, lower extremity amputations and wheelchair-bound who was found down after developing diffuse right-sided abdominal pain, vomiting and diarrhea the night prior.  Presented in shock with fever, tachycardia, leukocytosis, lactic acidosis, AKI and was found to have appendicitis.  He was resuscitated and underwent Laparoscopic appendectomy with findings of peritoneal abscess and diffuse purulent peritonitis on 8/1 with Dr. Vanderbilt  -Ileus expected, continue NG for now and await return of bowel function - Continue IV antibiotics - Mobilize as able - Appreciate medical team management of comorbidities, currently in A-fib with RVR   LOS: 2 days    Mitzie DELENA Freund 01/16/2024

## 2024-01-16 NOTE — Progress Notes (Signed)
 Echocardiogram 2D Echocardiogram has been performed.  [name] Erron Wengert RDCS 01/16/2024, 3:40 PM

## 2024-01-17 LAB — CBC
HCT: 42.3 % (ref 39.0–52.0)
Hemoglobin: 12.9 g/dL — ABNORMAL LOW (ref 13.0–17.0)
MCH: 25 pg — ABNORMAL LOW (ref 26.0–34.0)
MCHC: 30.5 g/dL (ref 30.0–36.0)
MCV: 82 fL (ref 80.0–100.0)
Platelets: 290 K/uL (ref 150–400)
RBC: 5.16 MIL/uL (ref 4.22–5.81)
RDW: 16.1 % — ABNORMAL HIGH (ref 11.5–15.5)
WBC: 15.2 K/uL — ABNORMAL HIGH (ref 4.0–10.5)
nRBC: 0 % (ref 0.0–0.2)

## 2024-01-17 LAB — GLUCOSE, CAPILLARY
Glucose-Capillary: 175 mg/dL — ABNORMAL HIGH (ref 70–99)
Glucose-Capillary: 214 mg/dL — ABNORMAL HIGH (ref 70–99)
Glucose-Capillary: 196 mg/dL — ABNORMAL HIGH (ref 70–99)
Glucose-Capillary: 173 mg/dL — ABNORMAL HIGH (ref 70–99)

## 2024-01-17 LAB — BASIC METABOLIC PANEL WITH GFR
Anion gap: 9 (ref 5–15)
BUN: 39 mg/dL — ABNORMAL HIGH (ref 8–23)
CO2: 25 mmol/L (ref 22–32)
Calcium: 8.4 mg/dL — ABNORMAL LOW (ref 8.9–10.3)
Chloride: 112 mmol/L — ABNORMAL HIGH (ref 98–111)
Creatinine, Ser: 2.06 mg/dL — ABNORMAL HIGH (ref 0.61–1.24)
GFR, Estimated: 35 mL/min — ABNORMAL LOW (ref >60)
Glucose, Bld: 193 mg/dL — ABNORMAL HIGH (ref 70–99)
Potassium: 3.8 mmol/L (ref 3.5–5.1)
Sodium: 146 mmol/L — ABNORMAL HIGH (ref 135–145)

## 2024-01-17 LAB — MAGNESIUM: Magnesium: 2 mg/dL (ref 1.7–2.4)

## 2024-01-17 MED ORDER — LACTATED RINGERS IV SOLN: INTRAVENOUS | Status: DC

## 2024-01-17 NOTE — Progress Notes (Incomplete)
 HD#3 SUBJECTIVE:  Patient Summary:This is a 63 year old gentleman with a past medical history of diabetes, status post left BKA who presented to the emergency department with sepsis on perforated appendix.   Overnight Events: Had episode of diarrhea last night, no A-fib or tachyarrhythmia reported. Interim History:  The patient's only complaint is that he has not been eating and does not want to remain NPO. He reports sleeping well and denies any worsening pain. He has several episodes of bulky watery diarrhea. The primary surgical team evaluated him and has initiated a clear liquid diet.    OBJECTIVE:  Vital Signs: Vitals:   01/17/24 0400 01/17/24 0820 01/17/24 1233 01/17/24 2003  BP: 118/80 132/81 (!) 140/86 (!) 152/92  Pulse: (!) 112   (!) 104  Resp: 18 15 20 15   Temp: 98.8 F (37.1 C) 98.1 F (36.7 C) 98.1 F (36.7 C) 97.9 F (36.6 C)  TempSrc: Oral Oral Oral Axillary  SpO2: 97%   100%  Weight:      Height:       Supplemental O2: Room Air SpO2: 100 % O2 Flow Rate (L/min): 2 L/min  Filed Weights   01/14/24 1634  Weight: 110.7 kg     Intake/Output Summary (Last 24 hours) at 01/17/2024 2037 Last data filed at 01/17/2024 1731 Gross per 24 hour  Intake 637.71 ml  Output 3490 ml  Net -2852.29 ml   Net IO Since Admission: -3,883.38 mL [01/17/24 2037]  Physical Exam: Physical Exam  Patient Lines/Drains/Airways Status     Active Line/Drains/Airways     Name Placement date Placement time Site Days   Peripheral IV 01/14/24 18 G 1.16 Anterior;Right Forearm 01/14/24  1340  Forearm  3   Peripheral IV 01/14/24 20 G 1 Anterior;Left Forearm 01/14/24  1635  Forearm  3   Closed System Drain 1 Right RUQ Bulb (JP) 19 Fr. 01/15/24  1151  RUQ  2   NG/OG Vented/Dual Lumen 16 Fr. Right nare Marking at nare/corner of mouth 57 cm 01/15/24  1153  Right nare  2   External Urinary Catheter 01/15/24  0100  --  2   Wound 01/15/24 1152 Surgical Open Surgical Incision Abdomen  Anterior;Medial 01/15/24  1152  Abdomen  2   Wound 01/15/24 1211 Surgical Closed Surgical Incision Abdomen 01/15/24  1211  Abdomen  2   Wound 01/15/24 1211 Surgical Laparoscopic 01/15/24  1211  --  2            Pertinent labs and imaging:      Latest Ref Rng & Units 01/17/2024    8:01 AM 01/16/2024    3:41 AM 01/15/2024    3:30 AM  CBC  WBC 4.0 - 10.5 K/uL 15.2  13.1  15.0   Hemoglobin 13.0 - 17.0 g/dL 87.0  88.3  87.8   Hematocrit 39.0 - 52.0 % 42.3  37.9  38.7   Platelets 150 - 400 K/uL 290  227  233        Latest Ref Rng & Units 01/17/2024    8:01 AM 01/16/2024    3:41 AM 01/15/2024    3:30 AM  CMP  Glucose 70 - 99 mg/dL 806  811  846   BUN 8 - 23 mg/dL 39  28  31   Creatinine 0.61 - 1.24 mg/dL 7.93  8.00  7.93   Sodium 135 - 145 mmol/L 146  144  141   Potassium 3.5 - 5.1 mmol/L 3.8  3.8  3.5  Chloride 98 - 111 mmol/L 112  112  108   CO2 22 - 32 mmol/L 25  23  23    Calcium 8.9 - 10.3 mg/dL 8.4  8.3  8.3     No results found.  ASSESSMENT/PLAN:  Assessment: Principal Problem:   Sepsis (HCC) Active Problems:   HTN (hypertension)   AKI (acute kidney injury) (HCC)   Acute appendicitis   Type 2 diabetes mellitus with circulatory disorder (HCC)   Hx of left BKA (HCC)   Lactic acidosis   Rhabdomyolysis   Colitis   Appendicitis with perforation   Plan: #Sepsis #Perforated appendix Patient is postop day 3  He was found to have perforated appendix after diagnostic laparoscopy.  Surgery is following.  Currently NPO.  Currently on fluids Zosyn , total antibiotic day 4.  Currently has NG tube in place.  Can hold p.o. meds. - Antibiotics zosyn  day 4 - Surgery following, appreciate recommendations - clear fluid diet - Monitor for further decompensation - Mobilize patient with PT/OT    # Paroxysmal atrial fibrillation Prior episodes of A-fib largely due to sepsis and perforated appendix, currently sinus rhythm with rate at 110- 120s.  Getting metoprolol  pushes.  No acute  concern for A-fib with RVR at this time. - Manage metoprolol  10 mg IV every 6 hours to p.o. metoprolol  100 mg twice daily - Monitor on telemetry - Heparin  injection 5000 units every 8 hours post-surgery, we can change it to Lovenox tomorrow if patient still stay stable  # Diarrhea The patient's diarrhea may be multifactorial--possibly related to a post-abdominal infection, post-surgical ileus, or a side effect of recently initiated Zosyn . -Monitor for ongoing episodes of diarrhea and document frequency/character. -Continue IV fluid support to maintain hydration. -Reassess need for antibiotic adjustment if symptoms persist or worsen. - Change diet to clear liquid #AKI versus CKD Improving. Creatinine has been stable at 1.84. Will continue with fluids.  - Continue IV fluids - Monitor BMP closely - Monitor urine output - Monitor for worsening renal function - Avoid nephrotoxic agents   #Rhabdomyolysis, resolving Improving. CK is trending down to 2654, 2 days ago.  - Continue fluid resuscitation   #Type 2 diabetes mellitus Glucose measuring between 176-189 -Continue sliding scale insulin    Diet: NPO IVF: LR,100cc/hr VTE: Heparin  Code: Full   Dispo: Anticipated discharge to Home in 3 days pending clinical improvement.     Signature:  Josian Lanese [facility]  8:37 PM, 01/17/2024  On Call pager [phone]

## 2024-01-17 NOTE — Progress Notes (Signed)
 HD#3 Subjective:   Summary: This is a 63 year old gentleman with a past medical history of diabetes, status post left BKA who presented to the emergency department with abdominal pain after falling off of his wheelchair.  Patient found to be septic likely from a perforated appendix and admitted for further evaluation and management.  Overnight Events: No acute events overnight.  Postop day 2.  Patient evaluated bedside this morning.  He states he is feeling better.  Wants to eat.  He states his pain is improving.  He denies any chest pain shortness of breath.  He reports having a bowel movement.  Objective:  Vital signs in last 24 hours: Vitals:   01/16/24 2019 01/16/24 2347 01/17/24 0400 01/17/24 0820  BP: (!) 140/77 (!) 153/67 118/80 132/81  Pulse: (!) 113 69 (!) 112   Resp: 18 17 18 15   Temp: 98.7 F (37.1 C) 99.1 F (37.3 C) 98.8 F (37.1 C) 98.1 F (36.7 C)  TempSrc: Oral Axillary Oral Oral  SpO2: 97% 92% 97%   Weight:      Height:       Supplemental O2: Nasal Cannula SpO2: 97 % O2 Flow Rate (L/min): 2 L/min   Physical Exam:  Constitutional: Resting in bed, no acute distress, NG tube in place HENT: NG tube in place, with high output Cardiovascular: Tachycardic rate, regular rhythm Pulmonary/Chest: normal work of breathing on room air, lungs clear to auscultation bilaterally Abdominal: Soft, nontender, drain in place, draining pink-tinged fluid  Filed Weights   01/14/24 1634  Weight: 110.7 kg     Intake/Output Summary (Last 24 hours) at 01/17/2024 1143 Last data filed at 01/17/2024 1010 Gross per 24 hour  Intake 1275.91 ml  Output 3300 ml  Net -2024.09 ml   Net IO Since Admission: -3,631.09 mL [01/17/24 1143]  Pertinent Labs:    Latest Ref Rng & Units 01/17/2024    8:01 AM 01/16/2024    3:41 AM 01/15/2024    3:30 AM  CBC  WBC 4.0 - 10.5 K/uL 15.2  13.1  15.0   Hemoglobin 13.0 - 17.0 g/dL 87.0  88.3  87.8   Hematocrit 39.0 - 52.0 % 42.3  37.9  38.7    Platelets 150 - 400 K/uL 290  227  233        Latest Ref Rng & Units 01/17/2024    8:01 AM 01/16/2024    3:41 AM 01/15/2024    3:30 AM  CMP  Glucose 70 - 99 mg/dL 806  811  846   BUN 8 - 23 mg/dL 39  28  31   Creatinine 0.61 - 1.24 mg/dL 7.93  8.00  7.93   Sodium 135 - 145 mmol/L 146  144  141   Potassium 3.5 - 5.1 mmol/L 3.8  3.8  3.5   Chloride 98 - 111 mmol/L 112  112  108   CO2 22 - 32 mmol/L 25  23  23    Calcium 8.9 - 10.3 mg/dL 8.4  8.3  8.3     Imaging: ECHOCARDIOGRAM COMPLETE Result Date: 01/16/2024    ECHOCARDIOGRAM REPORT   Patient Name:   [name] Date of Exam: 01/16/2024 Medical Rec #:  [id number]     Height:       69.0 in Accession #:    [id number]    Weight:       244.0 lb Date of Birth:  1960-08-07     BSA:  2.248 m Patient Age:    63 years      BP:           154/60 mmHg Patient Gender: M             HR:           112 bpm. Exam Location:  Inpatient Procedure: 2D Echo, Cardiac Doppler, Color Doppler and Intracardiac            Opacification Agent (Both Spectral and Color Flow Doppler were            utilized during procedure). Indications:     I48.91* Unspecified atrial fibrillation  History:         Patient has no prior history of Echocardiogram examinations.                  Risk Factors:Hypertension, Diabetes and Dyslipidemia.  Sonographer:     Damien Senior RDCS Referring Phys:  [phone] THOMAS CORNETT Diagnosing Phys: Salena Negri MD IMPRESSIONS  1. Left ventricular ejection fraction, by estimation, is 55 to 60%. The left ventricle has normal function. The left ventricle has no regional wall motion abnormalities. There is mild concentric left ventricular hypertrophy. Left ventricular diastolic parameters are consistent with Grade I diastolic dysfunction (impaired relaxation).  2. Right ventricular systolic function is normal. The right ventricular size is normal.  3. Left atrial size was mildly dilated.  4. The mitral valve is normal in structure. Trivial mitral valve  regurgitation.  5. The aortic valve is tricuspid. Aortic valve regurgitation is not visualized. Aortic valve sclerosis is present, with no evidence of aortic valve stenosis.  6. The inferior vena cava is normal in size with <50% respiratory variability, suggesting right atrial pressure of 8 mmHg. Conclusion(s)/Recommendation(s): Findings consistent with hypertrophic cardiomyopathy. FINDINGS  Left Ventricle: Left ventricular ejection fraction, by estimation, is 55 to 60%. The left ventricle has normal function. The left ventricle has no regional wall motion abnormalities. Definity  contrast agent was given IV to delineate the left ventricular  endocardial borders. The left ventricular internal cavity size was normal in size. There is mild concentric left ventricular hypertrophy. Left ventricular diastolic parameters are consistent with Grade I diastolic dysfunction (impaired relaxation). Right Ventricle: The right ventricular size is normal. No increase in right ventricular wall thickness. Right ventricular systolic function is normal. Left Atrium: Left atrial size was mildly dilated. Right Atrium: Right atrial size was normal in size. Pericardium: There is no evidence of pericardial effusion. Mitral Valve: The mitral valve is normal in structure. Trivial mitral valve regurgitation. Tricuspid Valve: The tricuspid valve is normal in structure. Tricuspid valve regurgitation is not demonstrated. Aortic Valve: The aortic valve is tricuspid. Aortic valve regurgitation is not visualized. Aortic valve sclerosis is present, with no evidence of aortic valve stenosis. Pulmonic Valve: The pulmonic valve was normal in structure. Pulmonic valve regurgitation is mild. Aorta: The aortic root is normal in size and structure. Venous: The inferior vena cava is normal in size with less than 50% respiratory variability, suggesting right atrial pressure of 8 mmHg. IAS/Shunts: No atrial level shunt detected by color flow Doppler.  LEFT  VENTRICLE PLAX 2D LVIDd:         4.30 cm LVIDs:         3.00 cm LV PW:         1.00 cm LV IVS:        1.20 cm LVOT diam:     2.20 cm LV SV:  41 LV SV Index:   18 LVOT Area:     3.80 cm  RIGHT VENTRICLE RV S prime:     13.60 cm/s TAPSE (M-mode): 1.5 cm LEFT ATRIUM           Index        RIGHT ATRIUM           Index LA diam:      3.40 cm 1.51 cm/m   RA Area:     11.50 cm LA Vol (A4C): 57.1 ml 25.40 ml/m  RA Volume:   23.20 ml  10.32 ml/m  AORTIC VALVE LVOT Vmax:   67.90 cm/s LVOT Vmean:  50.200 cm/s LVOT VTI:    0.107 m  AORTA Ao Root diam: 3.60 cm Ao Asc diam:  3.70 cm  SHUNTS Systemic VTI:  0.11 m Systemic Diam: 2.20 cm Salena Negri MD Electronically signed by Salena Negri MD Signature Date/Time: 01/16/2024/3:51:22 PM    Final     Assessment/Plan:   Principal Problem:   Sepsis (HCC) Active Problems:   HTN (hypertension)   AKI (acute kidney injury) (HCC)   Acute appendicitis   Type 2 diabetes mellitus with circulatory disorder (HCC)   Hx of left BKA (HCC)   Lactic acidosis   Rhabdomyolysis   Colitis   Appendicitis with perforation   Patient Summary: [name] is a 63 y.o. gentleman with a past medical history of diabetes, status post left BKA who presented to the emergency department with abdominal pain after falling off of his wheelchair.  Patient found to be septic likely from a perforated appendix and admitted for further evaluation and management.  #Sepsis #Perforated appendix Patient is postop day 2.  He was found to have perforated appendix after diagnostic laparoscopy.  Surgery is following.  Currently NPO.  Currently on fluids Zosyn , total antibiotic day 3.  Currently has NG tube in place.  Can hold p.o. meds. - Antibiotics day 3 - Surgery following, appreciate recommendations - N.p.o. until cleared by surgery - Monitor for further decompensation - Mobilize patient with PT/OT   # Paroxysmal atrial fibrillation Patient seems to be converting in and out of A-fib.   This is likely in the setting of sepsis from perforated appendix.  Currently sinus rhythm with rate at 110s.  Getting metoprolol  pushes.  No acute concern for A-fib with RVR at this time. - Continue metoprolol  10 mg every 6 hours, will eventually convert to p.o. once patient can take p.o. medicine - Monitor on telemetry - Do not think he needs anticoagulation at this time as this is not chronic  #AKI versus CKD Creatinine has been stable at 2.06 and 1.99.  Wonder if patient has component of CKD.  Will continue with fluids.  - Continue IV fluids - Monitor BMP closely - Monitor urine output - Monitor for worsening renal function - Avoid nephrotoxic agents  #Rhabdomyolysis, resolving CK is trending down well.  CK peaked.  No longer trending.  - Continue fluid resuscitation  #Type 2 diabetes mellitus Glucose measuring between 141-175 -Continue sliding scale insulin   Diet: NPO IVF: LR,100cc/hr VTE: Heparin  Code: Full  Dispo: Anticipated discharge to Home in 3 days pending clinical improvement.   Libby Blanch DO Internal Medicine Resident PGY-3 Please contact the on call pager after 5 pm and on weekends at [phone].

## 2024-01-17 NOTE — Progress Notes (Addendum)
 2 Days Post-Op   Subjective/Chief Complaint: Reports multiple bowel movements overnight.  Pain is well-controlled.   Objective: Vital signs in last 24 hours: Temp:  [98 F (36.7 C)-99.1 F (37.3 C)] 98.1 F (36.7 C) (08/03 0820) Pulse Rate:  [62-113] 112 (08/03 0400) Resp:  [15-25] 15 (08/03 0820) BP: (118-156)/(54-83) 132/81 (08/03 0820) SpO2:  [92 %-99 %] 97 % (08/03 0400)    Intake/Output from previous day: 08/02 0701 - 08/03 0700 In: 1275.9 [I.V.:825.9; IV Piggyback:450] Out: 2475 [Urine:850; Emesis/NG output:1550; Drains:75] Intake/Output this shift: No intake/output data recorded.  Alert, no distress Unlabored respirations on nasal cannula NG tube output bilious, 1550 recorded last 24 hours but patient is drinking ice.   Abdomen is soft, minimally distended.  Port sites clean and dry with staples.  Midline wound is clean without unusual drainage.  Drain output is serosanguineous.  Lab Results:  Recent Labs    01/16/24 0341 01/17/24 0801  WBC 13.1* 15.2*  HGB 11.6* 12.9*  HCT 37.9* 42.3  PLT 227 290   BMET Recent Labs    01/15/24 0330 01/16/24 0341  NA 141 144  K 3.5 3.8  CL 108 112*  CO2 23 23  GLUCOSE 153* 188*  BUN 31* 28*  CREATININE 2.06* 1.99*  CALCIUM 8.3* 8.3*   PT/INR Recent Labs    01/14/24 1350  LABPROT 18.4*  INR 1.5*   ABG No results for input(s): PHART, HCO3 in the last 72 hours.  Invalid input(s): PCO2, PO2  Studies/Results: ECHOCARDIOGRAM COMPLETE Result Date: 01/16/2024    ECHOCARDIOGRAM REPORT   Patient Name:   [name] Date of Exam: 01/16/2024 Medical Rec #:  [id number]     Height:       69.0 in Accession #:    [id number]    Weight:       244.0 lb Date of Birth:  1960/10/09     BSA:          2.248 m Patient Age:    63 years      BP:           154/60 mmHg Patient Gender: M             HR:           112 bpm. Exam Location:  Inpatient Procedure: 2D Echo, Cardiac Doppler, Color Doppler and Intracardiac             Opacification Agent (Both Spectral and Color Flow Doppler were            utilized during procedure). Indications:     I48.91* Unspecified atrial fibrillation  History:         Patient has no prior history of Echocardiogram examinations.                  Risk Factors:Hypertension, Diabetes and Dyslipidemia.  Sonographer:     Damien Senior RDCS Referring Phys:  [phone] THOMAS CORNETT Diagnosing Phys: Salena Negri MD IMPRESSIONS  1. Left ventricular ejection fraction, by estimation, is 55 to 60%. The left ventricle has normal function. The left ventricle has no regional wall motion abnormalities. There is mild concentric left ventricular hypertrophy. Left ventricular diastolic parameters are consistent with Grade I diastolic dysfunction (impaired relaxation).  2. Right ventricular systolic function is normal. The right ventricular size is normal.  3. Left atrial size was mildly dilated.  4. The mitral valve is normal in structure. Trivial mitral valve regurgitation.  5. The aortic valve is tricuspid. Aortic valve regurgitation  is not visualized. Aortic valve sclerosis is present, with no evidence of aortic valve stenosis.  6. The inferior vena cava is normal in size with <50% respiratory variability, suggesting right atrial pressure of 8 mmHg. Conclusion(s)/Recommendation(s): Findings consistent with hypertrophic cardiomyopathy. FINDINGS  Left Ventricle: Left ventricular ejection fraction, by estimation, is 55 to 60%. The left ventricle has normal function. The left ventricle has no regional wall motion abnormalities. Definity  contrast agent was given IV to delineate the left ventricular  endocardial borders. The left ventricular internal cavity size was normal in size. There is mild concentric left ventricular hypertrophy. Left ventricular diastolic parameters are consistent with Grade I diastolic dysfunction (impaired relaxation). Right Ventricle: The right ventricular size is normal. No increase in right ventricular  wall thickness. Right ventricular systolic function is normal. Left Atrium: Left atrial size was mildly dilated. Right Atrium: Right atrial size was normal in size. Pericardium: There is no evidence of pericardial effusion. Mitral Valve: The mitral valve is normal in structure. Trivial mitral valve regurgitation. Tricuspid Valve: The tricuspid valve is normal in structure. Tricuspid valve regurgitation is not demonstrated. Aortic Valve: The aortic valve is tricuspid. Aortic valve regurgitation is not visualized. Aortic valve sclerosis is present, with no evidence of aortic valve stenosis. Pulmonic Valve: The pulmonic valve was normal in structure. Pulmonic valve regurgitation is mild. Aorta: The aortic root is normal in size and structure. Venous: The inferior vena cava is normal in size with less than 50% respiratory variability, suggesting right atrial pressure of 8 mmHg. IAS/Shunts: No atrial level shunt detected by color flow Doppler.  LEFT VENTRICLE PLAX 2D LVIDd:         4.30 cm LVIDs:         3.00 cm LV PW:         1.00 cm LV IVS:        1.20 cm LVOT diam:     2.20 cm LV SV:         41 LV SV Index:   18 LVOT Area:     3.80 cm  RIGHT VENTRICLE RV S prime:     13.60 cm/s TAPSE (M-mode): 1.5 cm LEFT ATRIUM           Index        RIGHT ATRIUM           Index LA diam:      3.40 cm 1.51 cm/m   RA Area:     11.50 cm LA Vol (A4C): 57.1 ml 25.40 ml/m  RA Volume:   23.20 ml  10.32 ml/m  AORTIC VALVE LVOT Vmax:   67.90 cm/s LVOT Vmean:  50.200 cm/s LVOT VTI:    0.107 m  AORTA Ao Root diam: 3.60 cm Ao Asc diam:  3.70 cm  SHUNTS Systemic VTI:  0.11 m Systemic Diam: 2.20 cm Salena Negri MD Electronically signed by Salena Negri MD Signature Date/Time: 01/16/2024/3:51:22 PM    Final    X-ray abdomen AP Result Date: 01/15/2024 CLINICAL DATA:  Confirm nasogastric tube placement EXAM: ABDOMEN - 1 VIEW COMPARISON:  None Available. FINDINGS: The bowel gas pattern is normal. Enteric tube tip terminates in gastric fundus.  Cholecystectomy clips in right upper quadrant. No radio-opaque calculi or other significant radiographic abnormality are seen. Degenerative changes of the spine. IMPRESSION: Proper position of enteric tube. Electronically Signed   By: Megan  Zare M.D.   On: 01/15/2024 13:06    Anti-infectives: Anti-infectives (From admission, onward)    Start  Dose/Rate Route Frequency Ordered Stop   01/16/24 2000  piperacillin -tazobactam (ZOSYN ) IVPB 3.375 g        3.375 g 12.5 mL/hr over 240 Minutes Intravenous Every 8 hours 01/16/24 1307     01/15/24 2030  vancomycin  (VANCOCIN ) IVPB 1000 mg/200 mL premix        1,000 mg 200 mL/hr over 60 Minutes Intravenous  Once 01/15/24 2018 01/15/24 2130   01/15/24 1000  cefTRIAXone  (ROCEPHIN ) 2 g in sodium chloride  0.9 % 100 mL IVPB  Status:  Discontinued        2 g 200 mL/hr over 30 Minutes Intravenous Every 24 hours 01/14/24 1626 01/16/24 1307   01/14/24 2200  metroNIDAZOLE  (FLAGYL ) IVPB 500 mg  Status:  Discontinued        500 mg 100 mL/hr over 60 Minutes Intravenous Every 12 hours 01/14/24 1626 01/16/24 1307   01/14/24 1657  vancomycin  variable dose per unstable renal function (pharmacist dosing)  Status:  Discontinued         Does not apply See admin instructions 01/14/24 1658 01/16/24 1307   01/14/24 1515  piperacillin -tazobactam (ZOSYN ) IVPB 3.375 g        3.375 g 100 mL/hr over 30 Minutes Intravenous STAT 01/14/24 1509 01/14/24 1641   01/14/24 1515  vancomycin  (VANCOREADY) IVPB 2000 mg/400 mL        2,000 mg 200 mL/hr over 120 Minutes Intravenous  Once 01/14/24 1511 01/14/24 1854       Assessment/Plan:  63 year old male with history of hypertension and diabetes, lower extremity amputations and wheelchair-bound who was found down after developing diffuse right-sided abdominal pain, vomiting and diarrhea the night prior.  Presented in shock with fever, tachycardia, leukocytosis, lactic acidosis, AKI and was found to have appendicitis.  He was  resuscitated and underwent Laparoscopic appendectomy with findings of peritoneal abscess and diffuse purulent peritonitis on 8/1 with Dr. Vanderbilt  - Having bowel function but high NG output, unclear how much volume he is consuming by mouth.  Will try intermittent clamping today and continue ice chips/sips of clears - Nursing to start damp to dry dressing changes to midline wound - Continue IV antibiotics -IV fluids reordered - Mobilize as able - Appreciate medical team management of comorbidities   LOS: 3 days    [name] 01/17/2024

## 2024-01-17 NOTE — NC FL2 (Signed)
 [place]  MEDICAID FL2 LEVEL OF CARE FORM     IDENTIFICATION  Patient Name: [name] Birthdate: Mar 30, 1961 Sex: male Admission Date (Current Location): 01/14/2024  [facility] [place] [facility] [place] Number:  Producer, television/film/video and Address:  [facility] [place], [address], [place], [place] [address]      Provider Number: [id number]  Attending Physician Name and Address:  Eben Reyes BROCKS, MD  Relative Name and Phone Number:  Gerri Maine (Sister)  [phone]    Current Level of Care: Hospital Recommended Level of Care: Skilled Nursing Facility Prior Approval Number:    Date Approved/Denied:   PASRR Number: screen still running  Discharge Plan: SNF    Current Diagnoses: Patient Active Problem List   Diagnosis Date Noted   Appendicitis with perforation 01/16/2024   Rhabdomyolysis 01/15/2024   Colitis 01/15/2024   Sepsis (HCC) 01/14/2024   AKI (acute kidney injury) (HCC) 01/14/2024   Acute appendicitis 01/14/2024   Type 2 diabetes mellitus with circulatory disorder (HCC) 01/14/2024   Hx of left BKA (HCC) 01/14/2024   Lactic acidosis 01/14/2024   BPH (benign prostatic hyperplasia) 04/28/2022   Subacute osteomyelitis, left ankle and foot (HCC) 11/30/2020   Abscess of bursa of left ankle 11/30/2020   Acute osteomyelitis of toe, right (HCC)    Diabetic polyneuropathy associated with type 2 diabetes mellitus (HCC) 08/14/2016   Chronic venous hypertension (idiopathic) with inflammation of bilateral lower extremity 08/14/2016   Ulcer of left heel, limited to breakdown of skin (HCC) 05/15/2016   Amputated toe of right foot (HCC) 07/27/2015   Great toe pain 06/17/2014   Type II diabetes mellitus with peripheral circulatory disorder (HCC) 06/17/2014   Elevated transaminase level 06/17/2014   HTN (hypertension) 06/17/2014   Visual disturbance     Orientation RESPIRATION BLADDER Height & Weight     Self, Time, Situation, Place  Normal Incontinent,  External catheter Weight: 244 lb (110.7 kg) Height:  5' 9 (175.3 cm)  BEHAVIORAL SYMPTOMS/MOOD NEUROLOGICAL BOWEL NUTRITION STATUS      Continent Diet (see DC summary)  AMBULATORY STATUS COMMUNICATION OF NEEDS Skin   Extensive Assist Verbally Other (Comment) (Closed System Drain 1 Right RUQ Bulb (JP) 19 Fr.    Wound 01/15/24 1152 Surgical Open Surgical Incision Abdomen Anterior;Medial. Surgical Laparoscopic. Wound 01/15/24 1211 Surgical Closed Surgical Incision Abdomen)                       Personal Care Assistance Level of Assistance  Bathing, Feeding, Dressing Bathing Assistance: Maximum assistance Feeding assistance: Limited assistance Dressing Assistance: Maximum assistance     Functional Limitations Info  Sight, Hearing, Speech Sight Info: Adequate Hearing Info: Adequate Speech Info: Adequate    SPECIAL CARE FACTORS FREQUENCY  PT (By licensed PT), OT (By licensed OT)     PT Frequency: 5x/week OT Frequency: 5x/week            Contractures Contractures Info: Not present    Additional Factors Info  Code Status, Allergies Code Status Info: FULL Allergies Info: NKA           Current Medications (01/17/2024):  This is the current hospital active medication list Current Facility-Administered Medications  Medication Dose Route Frequency Provider Last Rate Last Admin   acetaminophen  (TYLENOL ) tablet 650 mg  650 mg Oral Q6H PRN Cornett, Debby, MD   650 mg at 01/15/24 0156   Or   acetaminophen  (TYLENOL ) suppository 650 mg  650 mg Rectal Q6H PRN Vanderbilt Debby, MD  heparin  injection 5,000 Units  5,000 Units Subcutaneous Q8H Cornett, Thomas, MD   5,000 Units at 01/17/24 0541   HYDROmorphone  (DILAUDID ) injection 0.5-1 mg  0.5-1 mg Intravenous Q2H PRN Cornett, Debby, MD       insulin  aspart (novoLOG ) injection 0-15 Units  0-15 Units Subcutaneous TID WC Cornett, Thomas, MD   3 Units at 01/17/24 9341   insulin  aspart (novoLOG ) injection 0-5 Units  0-5 Units  Subcutaneous QHS Cornett, Debby, MD       lactated ringers  infusion   Intravenous Continuous Signe Cree A, MD 100 mL/hr at 01/17/24 1020 New Bag at 01/17/24 1020   metoprolol  tartrate (LOPRESSOR ) injection 10 mg  10 mg Intravenous Q6H Patel, Amar, DO   10 mg at 01/17/24 0541   mupirocin  ointment (BACTROBAN ) 2 % 1 Application  1 Application Nasal BID Cornett, Debby, MD   1 Application at 01/17/24 1020   ondansetron  (ZOFRAN ) injection 4 mg  4 mg Intravenous Q6H PRN Signe Cree LABOR, MD       oxyCODONE  (Oxy IR/ROXICODONE ) immediate release tablet 5 mg  5 mg Oral Q4H PRN Cornett, Debby, MD       pantoprazole  (PROTONIX ) EC tablet 40 mg  40 mg Oral BID Azadegan, Maryam, MD   40 mg at 01/17/24 1020   piperacillin -tazobactam (ZOSYN ) IVPB 3.375 g  3.375 g Intravenous Q8H Pham, Minh Q, RPH-CPP 12.5 mL/hr at 01/17/24 0544 3.375 g at 01/17/24 0544     Discharge Medications: Please see discharge summary for a list of discharge medications.  Relevant Imaging Results:  Relevant Lab Results:   Additional Information SSN: [id number]  Lige Lakeman A [place], LCSW

## 2024-01-17 NOTE — TOC Initial Note (Signed)
 Transition of Care [facility]) - Initial/Assessment Note    Patient Details  Name: [name] MRN: [id number] Date of Birth: 1960/09/11  [facility]) CM/SW Contact:    Addison Freimuth A [place], LCSW Phone Number: 01/17/2024, 12:56 PM  Clinical Narrative:                  CSW spoke with pt, from home alone. Hx of SNF, pt could not recall the name of the facility. Agreeable to recommendation for SNF at discharge. CSW explained pt's has NG tube, cannot fax out but will send referral once tube has been removed as SNF's cannot manage those in their facilities, pt acknowledged understanding. CSW followed up with pt's sister Rogers, confirmed pt has been to SNF in the past, could not recall name. [place] she is the main POC, provided mobile number, [phone] in addition to home phone listed in chart.   PASSR pending, needs 30 day noted uploaded to [facility]CM[facility].  SNF workup to be completed once able.   CSW will continue to follow.  Expected Discharge Plan: Skilled Nursing Facility Barriers to Discharge: Continued Medical Work up, English as a second language teacher, SNF Pending bed offer   Patient Goals and CMS Choice            Expected Discharge Plan and Services       Living arrangements for the past 2 months: Apartment                                      Prior Living Arrangements/Services Living arrangements for the past 2 months: Apartment Lives with:: Self          Need for Family Participation in Patient Care: Yes (Comment) Care giver support system in place?: Yes (comment) (Pt's sister Rogers)      Activities of Daily Living   ADL Screening (condition at time of admission) Independently performs ADLs?: No Does the patient have a NEW difficulty with bathing/dressing/toileting/self-feeding that is expected to last >3 days?: Yes (Initiates electronic notice to provider for possible OT consult) Does the patient have a NEW difficulty with getting in/out of bed, walking, or  climbing stairs that is expected to last >3 days?: Yes (Initiates electronic notice to provider for possible PT consult) Does the patient have a NEW difficulty with communication that is expected to last >3 days?: No Is the patient deaf or have difficulty hearing?: No Does the patient have difficulty seeing, even when wearing glasses/contacts?: No Does the patient have difficulty concentrating, remembering, or making decisions?: No  Permission Sought/Granted                  Emotional Assessment Appearance:: Appears stated age Attitude/Demeanor/Rapport: Lethargic Affect (typically observed): Flat, Calm Orientation: : Oriented to Self, Oriented to Place, Oriented to Situation Alcohol  / Substance Use: Not Applicable Psych Involvement: No (comment)  Admission diagnosis:  Colitis [K52.9] AKI (acute kidney injury) (HCC) [N17.9] Sepsis (HCC) [A41.9] Sepsis with acute renal failure without septic shock, due to unspecified organism, unspecified acute renal failure type (HCC) [A41.9, R65.20, N17.9] Patient Active Problem List   Diagnosis Date Noted   Appendicitis with perforation 01/16/2024   Rhabdomyolysis 01/15/2024   Colitis 01/15/2024   Sepsis (HCC) 01/14/2024   AKI (acute kidney injury) (HCC) 01/14/2024   Acute appendicitis 01/14/2024   Type 2 diabetes mellitus with circulatory disorder (HCC) 01/14/2024   Hx of left BKA (HCC) 01/14/2024   Lactic  acidosis 01/14/2024   BPH (benign prostatic hyperplasia) 04/28/2022   Subacute osteomyelitis, left ankle and foot (HCC) 11/30/2020   Abscess of bursa of left ankle 11/30/2020   Acute osteomyelitis of toe, right (HCC)    Diabetic polyneuropathy associated with type 2 diabetes mellitus (HCC) 08/14/2016   Chronic venous hypertension (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 [place] [address] Phone: [phone] Fax: [phone]     Social Drivers of Health (SDOH) Social History: SDOH Screenings   Food Insecurity: No Food Insecurity (01/16/2024)  Housing: Low Risk  (01/16/2024)  Transportation Needs: Unmet Transportation Needs (01/16/2024)  Utilities: Not At Risk (01/16/2024)  Tobacco Use: Medium Risk (01/15/2024)   SDOH Interventions:     Readmission Risk Interventions     No data to display

## 2024-01-17 NOTE — Evaluation (Signed)
 Occupational Therapy Evaluation Patient Details Name: [name] MRN: [id number] DOB: 06-21-1960 Today's Date: 01/17/2024   History of Present Illness   63 y.o. male admitted 01/14/24 with abdominal pain after being found down at home. Workup for septic shock, AKI, appendicitis. S/p laparoscopic appendectomy with findings of peritoneal abscess and diffuse purulent peritonitis on 8/1. PMH includes L BKA (2022), R transmetatarsal amputation, HTN, DM.     Clinical Impressions PTA, pt lived alone and was independent for in home ADL and IADL; uses [employer] transportation and cousins take him to grocery store every few weeks. Upon eval, pt performing UB ADL with set-up and LB ADL with mod-total A. Pt with decreased strength, activity tolerance, safety, and mobility. Patient will benefit from continued inpatient follow up therapy, <3 hours/day     If plan is discharge home, recommend the following:   A little help with walking and/or transfers;A lot of help with bathing/dressing/bathroom;Assistance with cooking/housework;Assist for transportation;Help with stairs or ramp for entrance     Functional Status Assessment   Patient has had a recent decline in their functional status and demonstrates the ability to make significant improvements in function in a reasonable and predictable amount of time.     Equipment Recommendations   None recommended by OT     Recommendations for Other Services         Precautions/Restrictions   Precautions Precautions: Fall;Other (comment) Recall of Precautions/Restrictions: Intact Precaution/Restrictions Comments: abdominal, NGT, JP drain Restrictions Weight Bearing Restrictions Per Provider Order: No     Mobility Bed Mobility Overal bed mobility: Needs Assistance Bed Mobility: Rolling, Sidelying to Sit Rolling: Min assist, Used rails Sidelying to sit: Mod assist, Used rails       General bed mobility comments: cues for technique and  light assist for trunk management    Transfers Overall transfer level: Needs assistance   Transfers: Bed to chair/wheelchair/BSC     Squat pivot transfers: Min assist       General transfer comment: to drop arm; pt would otherwise be unable to clear bottom over arm of chair without drop arm this session. Pt reports at baseline, he stands and pivots      Balance Overall balance assessment: Needs assistance   Sitting balance-Leahy Scale: Fair Sitting balance - Comments: initially reliant on UE support to maintain balance, progressing to no UE suppor       Standing balance comment: NT                           ADL either performed or assessed with clinical judgement   ADL Overall ADL's : Needs assistance/impaired Eating/Feeding: NPO Eating/Feeding Details (indicate cue type and reason): except ice chips; mod I for this Grooming: Set up;Sitting   Upper Body Bathing: Set up;Sitting   Lower Body Bathing: Moderate assistance;Sitting/lateral leans;Sit to/from stand   Upper Body Dressing : Set up;Sitting   Lower Body Dressing: Total assistance Lower Body Dressing Details (indicate cue type and reason): to don R sock Toilet Transfer: Minimal assistance;Squat-pivot Toilet Transfer Details (indicate cue type and reason): needing 2 pivots to get to chair with drop arm                 Vision   Additional Comments: not formally assessed this session     Perception         Praxis         Pertinent Vitals/Pain Pain Assessment Pain Assessment: Faces Faces Pain Scale:  Hurts little more Pain Location: abdomen Pain Descriptors / Indicators: Sore Pain Intervention(s): Limited activity within patient's tolerance, Monitored during session     Extremity/Trunk Assessment Upper Extremity Assessment Upper Extremity Assessment: Generalized weakness   Lower Extremity Assessment Lower Extremity Assessment: Defer to PT evaluation   Cervical / Trunk  Assessment Cervical / Trunk Assessment: Kyphotic;Other exceptions (habitus)   Communication Communication Communication: No apparent difficulties   Cognition Arousal: Alert Behavior During Therapy: Flat affect Cognition: No family/caregiver present to determine baseline             OT - Cognition Comments: follows one step commands, suspect close to baseline. increased time for problem solving during new learning                 Following commands: Intact       Cueing  General Comments   Cueing Techniques: Verbal cues  educated regarding abdominal precautions. Pt HR up to 133 with transfer and needing 3+ mins to recover down to 112   Exercises     Shoulder Instructions      Home Living Family/patient expects to be discharged to:: Private residence Living Arrangements: Alone Available Help at Discharge: Other (Comment) (cousins, infrequently) Type of Home: House Home Access: Level entry     Home Layout: One level     Bathroom Shower/Tub: Tub/shower unit         Home Equipment: Agricultural consultant (2 wheels);Cane - single point;Tub bench;Wheelchair - manual;Wheelchair - power   Additional Comments: has not worn LLE prosthetic for at least a few months due to L residual limb swelling      Prior Functioning/Environment Prior Level of Function : Independent/Modified Independent             Mobility Comments: mod indep with maual w/c inside, power w/c outside; stand pivot transfer without DME; sleeps in recliner. [employer] for transportation ADLs Comments: reports mod indep, uses tub bench. cousins take him to grocery store every several weeks    OT Problem List: Decreased strength;Decreased activity tolerance;Impaired balance (sitting and/or standing);Decreased safety awareness;Decreased knowledge of use of DME or AE;Decreased knowledge of precautions;Pain   OT Treatment/Interventions: Self-care/ADL training;Therapeutic exercise;DME and/or AE  instruction;Therapeutic activities;Patient/family education;Balance training      OT Goals(Current goals can be found in the care plan section)   Acute Rehab OT Goals Patient Stated Goal: get better OT Goal Formulation: With patient Time For Goal Achievement: 01/31/24 Potential to Achieve Goals: Good   OT Frequency:  Min 2X/week    Co-evaluation              AM-PAC OT 6 Clicks Daily Activity     Outcome Measure Help from another person eating meals?: None Help from another person taking care of personal grooming?: A Little Help from another person toileting, which includes using toliet, bedpan, or urinal?: A Lot Help from another person bathing (including washing, rinsing, drying)?: A Lot Help from another person to put on and taking off regular upper body clothing?: A Little Help from another person to put on and taking off regular lower body clothing?: A Lot 6 Click Score: 16   End of Session Nurse Communication: Mobility status  Activity Tolerance: Patient tolerated treatment well Patient left: in chair;with chair alarm set;with call bell/phone within reach  OT Visit Diagnosis: Unsteadiness on feet (R26.81);Muscle weakness (generalized) (M62.81);Pain Pain - part of body:  (abd)                Time: [phone]  OT Time Calculation (min): 20 min Charges:  OT General Charges $OT Visit: 1 Visit OT Evaluation $OT Eval Low Complexity: 1 Low  Elma JONETTA Lebron FREDERICK, OTR/L [facility] Office: [phone]   Elma JONETTA Lebron 01/17/2024, 9:33 AM

## 2024-01-17 NOTE — Progress Notes (Signed)
 Name: [name] DOB: May 26, 1961  Please be advised that the above-named patient will require a short-term nursing home stay -- anticipated 30 days or less for rehabilitation and strengthening. The plan is for return home.

## 2024-01-18 LAB — GLUCOSE, CAPILLARY
Glucose-Capillary: 176 mg/dL — ABNORMAL HIGH (ref 70–99)
Glucose-Capillary: 189 mg/dL — ABNORMAL HIGH (ref 70–99)
Glucose-Capillary: 151 mg/dL — ABNORMAL HIGH (ref 70–99)
Glucose-Capillary: 201 mg/dL — ABNORMAL HIGH (ref 70–99)

## 2024-01-18 LAB — BASIC METABOLIC PANEL WITH GFR
Anion gap: 11 (ref 5–15)
BUN: 44 mg/dL — ABNORMAL HIGH (ref 8–23)
CO2: 24 mmol/L (ref 22–32)
Calcium: 8.5 mg/dL — ABNORMAL LOW (ref 8.9–10.3)
Chloride: 113 mmol/L — ABNORMAL HIGH (ref 98–111)
Creatinine, Ser: 1.84 mg/dL — ABNORMAL HIGH (ref 0.61–1.24)
GFR, Estimated: 40 mL/min — ABNORMAL LOW (ref >60)
Glucose, Bld: 190 mg/dL — ABNORMAL HIGH (ref 70–99)
Potassium: 3.6 mmol/L (ref 3.5–5.1)
Sodium: 148 mmol/L — ABNORMAL HIGH (ref 135–145)

## 2024-01-18 LAB — MAGNESIUM: Magnesium: 2 mg/dL (ref 1.7–2.4)

## 2024-01-18 LAB — CBC
HCT: 40 % (ref 39.0–52.0)
Hemoglobin: 12.1 g/dL — ABNORMAL LOW (ref 13.0–17.0)
MCH: 25.1 pg — ABNORMAL LOW (ref 26.0–34.0)
MCHC: 30.3 g/dL (ref 30.0–36.0)
MCV: 82.8 fL (ref 80.0–100.0)
Platelets: 289 K/uL (ref 150–400)
RBC: 4.83 MIL/uL (ref 4.22–5.81)
RDW: 16.1 % — ABNORMAL HIGH (ref 11.5–15.5)
WBC: 14.4 K/uL — ABNORMAL HIGH (ref 4.0–10.5)
nRBC: 0 % (ref 0.0–0.2)

## 2024-01-18 MED ORDER — TAMSULOSIN HCL 0.4 MG PO CAPS
0.4000 mg | ORAL_CAPSULE | Freq: Every day | ORAL | Status: DC
  Administered 2024-01-18 – 2024-01-21 (×4): 0.4 mg via ORAL
  Filled 2024-01-18 (×4): qty 1

## 2024-01-18 MED ORDER — METOPROLOL TARTRATE 100 MG PO TABS
100.0000 mg | ORAL_TABLET | Freq: Two times a day (BID) | ORAL | Status: DC
  Administered 2024-01-18 – 2024-01-21 (×7): 100 mg via ORAL
  Filled 2024-01-18 (×6): qty 1

## 2024-01-18 MED ORDER — FREE WATER
500.0000 mL | Status: AC
  Administered 2024-01-18: 500 mL via ORAL

## 2024-01-18 MED ORDER — SIMVASTATIN 20 MG PO TABS
10.0000 mg | ORAL_TABLET | Freq: Every day | ORAL | Status: DC
  Administered 2024-01-18 – 2024-01-20 (×3): 10 mg via ORAL
  Filled 2024-01-18 (×2): qty 1

## 2024-01-18 MED ORDER — INSULIN GLARGINE-YFGN 100 UNIT/ML ~~LOC~~ SOLN
10.0000 [IU] | Freq: Every day | SUBCUTANEOUS | Status: DC
  Administered 2024-01-18 – 2024-01-20 (×3): 10 [IU] via SUBCUTANEOUS
  Filled 2024-01-18 (×4): qty 0.1

## 2024-01-18 MED ORDER — LISINOPRIL 10 MG PO TABS
10.0000 mg | ORAL_TABLET | Freq: Every day | ORAL | Status: DC
  Administered 2024-01-18 – 2024-01-21 (×4): 10 mg via ORAL
  Filled 2024-01-18 (×4): qty 1

## 2024-01-18 NOTE — Progress Notes (Signed)
 PT Cancellation Note  Patient Details Name: [name] MRN: [id number] DOB: 06/29/1960   Cancelled Treatment:    Reason Eval/Treat Not Completed: (P) Fatigue/lethargy limiting ability to participate (pt only briefly opens eyes to command, even with max verbal cues and HOB elevated to promote alertness. Pt mumbles no when pt encouraged to awaken and participate in therapy sesison.) Will continue efforts per PT plan of care as schedule permits.   Cortez Flippen M Samik Balkcom 01/18/2024, 3:04 PM

## 2024-01-18 NOTE — Progress Notes (Incomplete)
 HD#4 SUBJECTIVE:  Patient Summary: This is a 63 year old gentleman with a past medical history of diabetes, status post left BKA who presented to the emergency department with sepsis on perforated appendix.   Overnight Events: N/A  Interim History:   Patient reports no pain with urination. He requests assistance with cleaning his wound dressings; he was informed that the surgical team will handle this. He is unsure of his stool appearance due to the rectal tube. He expresses dislike for the jello and coffee but enjoys chicken broth. He is being monitored for fever. Dietary changes will only be made if approved by surgery, who have advanced his diet to soft.  OBJECTIVE:  Vital Signs: Vitals:   01/18/24 0756 01/18/24 1152 01/18/24 1500 01/18/24 1646  BP: (!) 139/93 (!) 169/112  (!) 157/98  Pulse: (!) 112 (!) 115 (!) 117 (!) 111  Resp: 16 13  20   Temp:  97.7 F (36.5 C)  97.9 F (36.6 C)  TempSrc: Oral Oral  Oral  SpO2: 92% 94%  99%  Weight:      Height:       Supplemental O2: Room Air SpO2: 99 % O2 Flow Rate (L/min): 2 L/min  Filed Weights   01/14/24 1634  Weight: 110.7 kg     Intake/Output Summary (Last 24 hours) at 01/18/2024 1821 Last data filed at 01/18/2024 1722 Gross per 24 hour  Intake 673.22 ml  Output 605 ml  Net 68.22 ml   Net IO Since Admission: -3,815.16 mL [01/18/24 1821]  Physical Exam: Physical Exam  Patient Lines/Drains/Airways Status     Active Line/Drains/Airways     Name Placement date Placement time Site Days   Peripheral IV 01/14/24 18 G 1.16 Anterior;Right Forearm 01/14/24  1340  Forearm  4   Peripheral IV 01/14/24 20 G 1 Anterior;Left Forearm 01/14/24  1635  Forearm  4   Closed System Drain 1 Right RUQ Bulb (JP) 19 Fr. 01/15/24  1151  RUQ  3   External Urinary Catheter 01/15/24  0100  --  3   Fecal Management System 35 mL 01/18/24  0830  -- less than 1   Wound 01/15/24 1152 Surgical Open Surgical Incision Abdomen Anterior;Medial 01/15/24   1152  Abdomen  3   Wound 01/15/24 1211 Surgical Closed Surgical Incision Abdomen 01/15/24  1211  Abdomen  3   Wound 01/15/24 1211 Surgical Laparoscopic 01/15/24  1211  --  3            Pertinent labs and imaging:      Latest Ref Rng & Units 01/18/2024    3:43 AM 01/17/2024    8:01 AM 01/16/2024    3:41 AM  CBC  WBC 4.0 - 10.5 K/uL 14.4  15.2  13.1   Hemoglobin 13.0 - 17.0 g/dL 87.8  87.0  88.3   Hematocrit 39.0 - 52.0 % 40.0  42.3  37.9   Platelets 150 - 400 K/uL 289  290  227        Latest Ref Rng & Units 01/18/2024    3:43 AM 01/17/2024    8:01 AM 01/16/2024    3:41 AM  CMP  Glucose 70 - 99 mg/dL 809  806  811   BUN 8 - 23 mg/dL 44  39  28   Creatinine 0.61 - 1.24 mg/dL 8.15  7.93  8.00   Sodium 135 - 145 mmol/L 148  146  144   Potassium 3.5 - 5.1 mmol/L 3.6  3.8  3.8  Chloride 98 - 111 mmol/L 113  112  112   CO2 22 - 32 mmol/L 24  25  23    Calcium 8.9 - 10.3 mg/dL 8.5  8.4  8.3     No results found.  ASSESSMENT/PLAN:  Assessment: Principal Problem:   Sepsis (HCC) Active Problems:   HTN (hypertension)   AKI (acute kidney injury) (HCC)   Acute appendicitis   Type 2 diabetes mellitus with circulatory disorder (HCC)   Hx of left BKA (HCC)   Lactic acidosis   Rhabdomyolysis   Colitis   Appendicitis with perforation   Plan:  This is a 63 year old gentleman with a past medical history of diabetes, status post left BKA who presented to the emergency department with sepsis on perforated appendix.    #Sepsis #Perforated appendix #UTI Patient is postop day 3  He was found to have perforated appendix after diagnostic laparoscopy.  Surgery is following.  Currently NPO.  Currently on fluids Zosyn , total antibiotic day 4.  Currently has NG tube in place.  Can hold p.o. meds. - Change Zosyn  to Unasyn  - Surgery following, appreciate recommendations - Diet changed to soft - Monitor for further decompensation - Mobilize patient with PT/OT    # Paroxysmal atrial  fibrillation Prior episodes of A-fib largely due to sepsis and perforated appendix, currently sinus rhythm with rate at 110- 120s.  Getting metoprolol  pushes.  No acute concern for A-fib with RVR at this time. - Manage metoprolol  10 mg IV every 6 hours to p.o. metoprolol  100 mg twice daily - Monitor on telemetry - Heparin  injection 5000 units every 8 hours post-surgery, we can change it to Lovenox tomorrow if patient still stay stable   # Diarrhea The patient's diarrhea may be multifactorial, possibly related to a post-abdominal infection, post-surgical ileus, or a side effect of antibiotic. - Has rectal tube and it was watery. -Continue IV fluid support to maintain hydration. - zosyn  has changed to Unasyn  - Change diet to soft #AKI versus CKD Improving. Creatinine has been stable at 1.57. Will continue with fluids.  - Continue IV fluids - Monitor BMP closely - Monitor urine output - Monitor for worsening renal function - Avoid nephrotoxic agents #Rhabdomyolysis, resolving Improving. CK is trending down to 2654, 2 days ago.  - Continue fluid resuscitation #Type 2 diabetes mellitus Glucose measuring between 176-189 -Continue sliding scale insulin    Diet: NPO IVF: LR,100cc/hr VTE: Heparin  Code: Full   Dispo: Anticipated discharge to Home in 3 days pending clinical improvement.   Signature:  Kyllian Clingerman [facility]ad[facility]  6:21 PM, 01/18/2024  On Call pager [phone]

## 2024-01-18 NOTE — Progress Notes (Signed)
 3 Days Post-Op   Subjective/Chief Complaint: Denies N/V since NG tube has been clamped for the last 24 hours. Endorsed having a large BM this AM and is passing flatus.  Pain appears to be well-controlled.   Objective: Vital signs in last 24 hours: Temp:  [97.7 F (36.5 C)-98.1 F (36.7 C)] 97.8 F (36.6 C) (08/04 0500) Pulse Rate:  [100-119] 112 (08/04 0756) Resp:  [13-20] 16 (08/04 0756) BP: (139-152)/(86-93) 139/93 (08/04 0756) SpO2:  [92 %-100 %] 92 % (08/04 0756) Last BM Date : 01/17/23  Intake/Output from previous day: 08/03 0701 - 08/04 0700 In: 587.7 [I.V.:510.9; IV Piggyback:76.8] Out: 2210 [Urine:1300; Emesis/NG output:850; Drains:60] Intake/Output this shift: No intake/output data recorded.  Alert, no distress Unlabored respirations on nasal cannula NG tube output bilious, 850 recorded overnight but it appears patient is consuming large amount of ice. Abdomen is soft, minimally distended. Port sites clean and dry with staples.  Midline wound is clean without unusual drainage.  Drain output is serosanguineous.  Lab Results:  Recent Labs    01/17/24 0801 01/18/24 0343  WBC 15.2* 14.4*  HGB 12.9* 12.1*  HCT 42.3 40.0  PLT 290 289   BMET Recent Labs    01/17/24 0801 01/18/24 0343  NA 146* 148*  K 3.8 3.6  CL 112* 113*  CO2 25 24  GLUCOSE 193* 190*  BUN 39* 44*  CREATININE 2.06* 1.84*  CALCIUM 8.4* 8.5*   PT/INR No results for input(s): LABPROT, INR in the last 72 hours.  ABG No results for input(s): PHART, HCO3 in the last 72 hours.  Invalid input(s): PCO2, PO2  Studies/Results: ECHOCARDIOGRAM COMPLETE Result Date: 01/16/2024    ECHOCARDIOGRAM REPORT   Patient Name:   [name] Date of Exam: 01/16/2024 Medical Rec #:  [id number]     Height:       69.0 in Accession #:    [id number]    Weight:       244.0 lb Date of Birth:  [birth year]     BSA:          2.248 m Patient Age:    63 years      BP:           154/60 mmHg Patient Gender: M              HR:           112 bpm. Exam Location:  Inpatient Procedure: 2D Echo, Cardiac Doppler, Color Doppler and Intracardiac            Opacification Agent (Both Spectral and Color Flow Doppler were            utilized during procedure). Indications:     I48.91* Unspecified atrial fibrillation  History:         Patient has no prior history of Echocardiogram examinations.                  Risk Factors:Hypertension, Diabetes and Dyslipidemia.  Sonographer:     Damien Senior RDCS Referring Phys:  [phone] THOMAS CORNETT Diagnosing Phys: Salena Negri MD IMPRESSIONS  1. Left ventricular ejection fraction, by estimation, is 55 to 60%. The left ventricle has normal function. The left ventricle has no regional wall motion abnormalities. There is mild concentric left ventricular hypertrophy. Left ventricular diastolic parameters are consistent with Grade I diastolic dysfunction (impaired relaxation).  2. Right ventricular systolic function is normal. The right ventricular size is normal.  3. Left atrial size was mildly dilated.  4.  The mitral valve is normal in structure. Trivial mitral valve regurgitation.  5. The aortic valve is tricuspid. Aortic valve regurgitation is not visualized. Aortic valve sclerosis is present, with no evidence of aortic valve stenosis.  6. The inferior vena cava is normal in size with <50% respiratory variability, suggesting right atrial pressure of 8 mmHg. Conclusion(s)/Recommendation(s): Findings consistent with hypertrophic cardiomyopathy. FINDINGS  Left Ventricle: Left ventricular ejection fraction, by estimation, is 55 to 60%. The left ventricle has normal function. The left ventricle has no regional wall motion abnormalities. Definity  contrast agent was given IV to delineate the left ventricular  endocardial borders. The left ventricular internal cavity size was normal in size. There is mild concentric left ventricular hypertrophy. Left ventricular diastolic parameters are consistent with Grade I  diastolic dysfunction (impaired relaxation). Right Ventricle: The right ventricular size is normal. No increase in right ventricular wall thickness. Right ventricular systolic function is normal. Left Atrium: Left atrial size was mildly dilated. Right Atrium: Right atrial size was normal in size. Pericardium: There is no evidence of pericardial effusion. Mitral Valve: The mitral valve is normal in structure. Trivial mitral valve regurgitation. Tricuspid Valve: The tricuspid valve is normal in structure. Tricuspid valve regurgitation is not demonstrated. Aortic Valve: The aortic valve is tricuspid. Aortic valve regurgitation is not visualized. Aortic valve sclerosis is present, with no evidence of aortic valve stenosis. Pulmonic Valve: The pulmonic valve was normal in structure. Pulmonic valve regurgitation is mild. Aorta: The aortic root is normal in size and structure. Venous: The inferior vena cava is normal in size with less than 50% respiratory variability, suggesting right atrial pressure of 8 mmHg. IAS/Shunts: No atrial level shunt detected by color flow Doppler.  LEFT VENTRICLE PLAX 2D LVIDd:         4.30 cm LVIDs:         3.00 cm LV PW:         1.00 cm LV IVS:        1.20 cm LVOT diam:     2.20 cm LV SV:         41 LV SV Index:   18 LVOT Area:     3.80 cm  RIGHT VENTRICLE RV S prime:     13.60 cm/s TAPSE (M-mode): 1.5 cm LEFT ATRIUM           Index        RIGHT ATRIUM           Index LA diam:      3.40 cm 1.51 cm/m   RA Area:     11.50 cm LA Vol (A4C): 57.1 ml 25.40 ml/m  RA Volume:   23.20 ml  10.32 ml/m  AORTIC VALVE LVOT Vmax:   67.90 cm/s LVOT Vmean:  50.200 cm/s LVOT VTI:    0.107 m  AORTA Ao Root diam: 3.60 cm Ao Asc diam:  3.70 cm  SHUNTS Systemic VTI:  0.11 m Systemic Diam: 2.20 cm Salena Negri MD Electronically signed by Salena Negri MD Signature Date/Time: 01/16/2024/3:51:22 PM    Final     Anti-infectives: Anti-infectives (From admission, onward)    Start     Dose/Rate Route Frequency  Ordered Stop   01/16/24 2000  piperacillin -tazobactam (ZOSYN ) IVPB 3.375 g        3.375 g 12.5 mL/hr over 240 Minutes Intravenous Every 8 hours 01/16/24 1307     01/15/24 2030  vancomycin  (VANCOCIN ) IVPB 1000 mg/200 mL premix        1,000 mg  200 mL/hr over 60 Minutes Intravenous  Once 01/15/24 2018 01/15/24 2130   01/15/24 1000  cefTRIAXone  (ROCEPHIN ) 2 g in sodium chloride  0.9 % 100 mL IVPB  Status:  Discontinued        2 g 200 mL/hr over 30 Minutes Intravenous Every 24 hours 01/14/24 1626 01/16/24 1307   01/14/24 2200  metroNIDAZOLE  (FLAGYL ) IVPB 500 mg  Status:  Discontinued        500 mg 100 mL/hr over 60 Minutes Intravenous Every 12 hours 01/14/24 1626 01/16/24 1307   01/14/24 1657  vancomycin  variable dose per unstable renal function (pharmacist dosing)  Status:  Discontinued         Does not apply See admin instructions 01/14/24 1658 01/16/24 1307   01/14/24 1515  piperacillin -tazobactam (ZOSYN ) IVPB 3.375 g        3.375 g 100 mL/hr over 30 Minutes Intravenous STAT 01/14/24 1509 01/14/24 1641   01/14/24 1515  vancomycin  (VANCOREADY) IVPB 2000 mg/400 mL        2,000 mg 200 mL/hr over 120 Minutes Intravenous  Once 01/14/24 1511 01/14/24 1854       Assessment/Plan: POD 3- s/p Laparocsopic Appendectomy, 8/1, Dr. Vanderbilt  63 year old male with history of hypertension and diabetes, lower extremity amputations and wheelchair-bound who was found down after developing diffuse right-sided abdominal pain, vomiting and diarrhea the night prior.  Presented in shock with fever, tachycardia, leukocytosis, lactic acidosis, AKI and was found to have appendicitis.  He was resuscitated and underwent Laparoscopic appendectomy with findings of peritoneal abscess and diffuse purulent peritonitis on 8/1 with Dr. Vanderbilt  - Having bowel function but had high NG output, unclear how much volume he is consuming by mouth. Plan to remove NGT since it has been clamped for the past 24 hours. If patient starts  having N/V, please let us  know.  - Nursing to start damp to dry dressing changes to midline wound - Continue IV antibiotics - IV fluids reordered - Mobilize as able - Appreciate medical team management of comorbidities  FEN- DC NGT, NPO except for sips with meds and Ice chips. May have sips of clears from the floor.  VTE- SubQ heparin  ID- Zosyn     LOS: 4 days    Eulah Hammonds, PA-C  01/18/2024

## 2024-01-18 NOTE — Plan of Care (Signed)

## 2024-01-18 NOTE — TOC Progression Note (Addendum)
 Transition of Care [facility] - Progression Note    Patient Details  Name: [name] MRN: [id number] Date of Birth: [birth year]  [facility] CM/SW Contact  Montie LOISE Louder, [place] Phone Number: 01/18/2024, 12:50 PM  Clinical Narrative:     Uploaded clinicals for psarr approval   TOC will continue to follow and assist with discharge planning.   Montie Louder, MSW, LCSW Clinical Social Worker    Expected Discharge Plan: Skilled Nursing Facility Barriers to Discharge: Continued Medical Work up, English as a second language teacher, SNF Pending bed offer               Expected Discharge Plan and Services       Living arrangements for the past 2 months: Apartment                                       Social Drivers of Health (SDOH) Interventions SDOH Screenings   Food Insecurity: No Food Insecurity (01/16/2024)  Housing: Low Risk  (01/16/2024)  Transportation Needs: Unmet Transportation Needs (01/16/2024)  Utilities: Not At Risk (01/16/2024)  Tobacco Use: Medium Risk (01/15/2024)    Readmission Risk Interventions     No data to display

## 2024-01-19 DIAGNOSIS — F209 Schizophrenia, unspecified: Secondary | ICD-10-CM | POA: Insufficient documentation

## 2024-01-19 LAB — CBC
HCT: 39 % (ref 39.0–52.0)
Hemoglobin: 11.8 g/dL — ABNORMAL LOW (ref 13.0–17.0)
MCH: 24.9 pg — ABNORMAL LOW (ref 26.0–34.0)
MCHC: 30.3 g/dL (ref 30.0–36.0)
MCV: 82.5 fL (ref 80.0–100.0)
Platelets: 300 K/uL (ref 150–400)
RBC: 4.73 MIL/uL (ref 4.22–5.81)
RDW: 16 % — ABNORMAL HIGH (ref 11.5–15.5)
WBC: 11.1 K/uL — ABNORMAL HIGH (ref 4.0–10.5)
nRBC: 0 % (ref 0.0–0.2)

## 2024-01-19 LAB — BASIC METABOLIC PANEL WITH GFR
Anion gap: 9 (ref 5–15)
BUN: 39 mg/dL — ABNORMAL HIGH (ref 8–23)
CO2: 25 mmol/L (ref 22–32)
Calcium: 8.2 mg/dL — ABNORMAL LOW (ref 8.9–10.3)
Chloride: 111 mmol/L (ref 98–111)
Creatinine, Ser: 1.57 mg/dL — ABNORMAL HIGH (ref 0.61–1.24)
GFR, Estimated: 49 mL/min — ABNORMAL LOW (ref >60)
Glucose, Bld: 189 mg/dL — ABNORMAL HIGH (ref 70–99)
Potassium: 3.5 mmol/L (ref 3.5–5.1)
Sodium: 145 mmol/L (ref 135–145)

## 2024-01-19 LAB — CULTURE, BLOOD (ROUTINE X 2)
Culture: NO GROWTH
Culture: NO GROWTH
Special Requests: ADEQUATE

## 2024-01-19 LAB — GLUCOSE, CAPILLARY
Glucose-Capillary: 174 mg/dL — ABNORMAL HIGH (ref 70–99)
Glucose-Capillary: 223 mg/dL — ABNORMAL HIGH (ref 70–99)
Glucose-Capillary: 178 mg/dL — ABNORMAL HIGH (ref 70–99)
Glucose-Capillary: 224 mg/dL — ABNORMAL HIGH (ref 70–99)

## 2024-01-19 LAB — SURGICAL PATHOLOGY

## 2024-01-19 MED ORDER — SODIUM CHLORIDE 0.9 % IV SOLN
3.0000 g | Freq: Four times a day (QID) | INTRAVENOUS | Status: AC
  Administered 2024-01-19 – 2024-01-20 (×6): 3 g via INTRAVENOUS
  Filled 2024-01-19 (×6): qty 8

## 2024-01-19 NOTE — Progress Notes (Signed)
 4 Days Post-Op   Subjective/Chief Complaint: Denies N/V. Tolerating clears. Endorsed having a BM this AM and is passing flatus. Pain appears well-controlled.  Objective: Vital signs in last 24 hours: Temp:  [97.7 F (36.5 C)-98.6 F (37 C)] 98.2 F (36.8 C) (08/05 0830) Pulse Rate:  [110-117] 115 (08/05 0830) Resp:  [12-20] 20 (08/05 0830) BP: (128-169)/(69-112) 128/69 (08/05 0830) SpO2:  [94 %-99 %] 95 % (08/05 0830) Last BM Date : 01/18/24  Intake/Output from previous day: 08/04 0701 - 08/05 0700 In: 673.2 [NG/GT:500; IV Piggyback:173.2] Out: 685 [Urine:600; Stool:85] Intake/Output this shift: No intake/output data recorded.  Alert, no distress Unlabored respirations on RA  Abdomen is soft, minimally distended. Port sites clean and dry with staples. Midline wound is clean without unusual drainage. Drain output is serous today.   Lab Results:  Recent Labs    01/18/24 0343 01/19/24 0433  WBC 14.4* 11.1*  HGB 12.1* 11.8*  HCT 40.0 39.0  PLT 289 300   BMET Recent Labs    01/18/24 0343 01/19/24 0433  NA 148* 145  K 3.6 3.5  CL 113* 111  CO2 24 25  GLUCOSE 190* 189*  BUN 44* 39*  CREATININE 1.84* 1.57*  CALCIUM 8.5* 8.2*   PT/INR No results for input(s): LABPROT, INR in the last 72 hours.  ABG No results for input(s): PHART, HCO3 in the last 72 hours.  Invalid input(s): PCO2, PO2  Studies/Results: No results found.   Anti-infectives: Anti-infectives (From admission, onward)    Start     Dose/Rate Route Frequency Ordered Stop   01/16/24 2000  piperacillin -tazobactam (ZOSYN ) IVPB 3.375 g        3.375 g 12.5 mL/hr over 240 Minutes Intravenous Every 8 hours 01/16/24 1307 01/20/24 2359   01/15/24 2030  vancomycin  (VANCOCIN ) IVPB 1000 mg/200 mL premix        1,000 mg 200 mL/hr over 60 Minutes Intravenous  Once 01/15/24 2018 01/15/24 2130   01/15/24 1000  cefTRIAXone  (ROCEPHIN ) 2 g in sodium chloride  0.9 % 100 mL IVPB  Status:  Discontinued         2 g 200 mL/hr over 30 Minutes Intravenous Every 24 hours 01/14/24 1626 01/16/24 1307   01/14/24 2200  metroNIDAZOLE  (FLAGYL ) IVPB 500 mg  Status:  Discontinued        500 mg 100 mL/hr over 60 Minutes Intravenous Every 12 hours 01/14/24 1626 01/16/24 1307   01/14/24 1657  vancomycin  variable dose per unstable renal function (pharmacist dosing)  Status:  Discontinued         Does not apply See admin instructions 01/14/24 1658 01/16/24 1307   01/14/24 1515  piperacillin -tazobactam (ZOSYN ) IVPB 3.375 g        3.375 g 100 mL/hr over 30 Minutes Intravenous STAT 01/14/24 1509 01/14/24 1641   01/14/24 1515  vancomycin  (VANCOREADY) IVPB 2000 mg/400 mL        2,000 mg 200 mL/hr over 120 Minutes Intravenous  Once 01/14/24 1511 01/14/24 1854       Assessment/Plan: POD 4- s/p Laparocsopic Appendectomy, 8/1, Dr. Vanderbilt  63 year old male with history of hypertension and diabetes, lower extremity amputations and wheelchair-bound who was found down after developing diffuse right-sided abdominal pain, vomiting and diarrhea the night prior.  Presented in shock with fever, tachycardia, leukocytosis, lactic acidosis, AKI and was found to have appendicitis.  He was resuscitated and underwent Laparoscopic appendectomy with findings of peritoneal abscess and diffuse purulent peritonitis on 8/1 with Dr. Vanderbilt  - Removed NG  8/4. If patient starts having N/V, please let us  know.  - Nursing to start damp to dry dressing changes to midline wound - Continue IV antibiotics until tomorrow, 8/6 - IV fluids reordered - Mobilize as able - Appreciate medical team management of comorbidities  FEN- NGT out. Advanced diet to soft.   VTE- SubQ heparin  ID- Zosyn     LOS: 5 days    Eulah Hammonds, PA-C  01/19/2024

## 2024-01-19 NOTE — TOC Progression Note (Signed)
 Transition of Care [facility]) - Progression Note    Patient Details  Name: Zayvien Canning MRN: [id number] Date of Birth: 07/13/1960  [facility] CM/SW Contact  Montie LOISE Louder, [place] Phone Number: 01/19/2024, 2:26 PM  Clinical Narrative:     PASRR remains pending- [facility] requesting MD schzoprenia added to H&P  and to resend clinicals once completed-   Residents updated of request.  Montie Louder, MSW, LCSW Clinical Social Worker    Expected Discharge Plan: Skilled Nursing Facility Barriers to Discharge: Continued Medical Work up, English as a second language teacher, SNF Pending bed offer               Expected Discharge Plan and Services       Living arrangements for the past 2 months: Apartment                                       Social Drivers of Health (SDOH) Interventions SDOH Screenings   Food Insecurity: No Food Insecurity (01/16/2024)  Housing: Low Risk  (01/16/2024)  Transportation Needs: Unmet Transportation Needs (01/16/2024)  Utilities: Not At Risk (01/16/2024)  Tobacco Use: Medium Risk (01/15/2024)    Readmission Risk Interventions     No data to display

## 2024-01-19 NOTE — Progress Notes (Signed)
   01/19/24 0830  Assess: MEWS Score  Temp 98.2 F (36.8 C)  BP 128/69  MAP (mmHg) 85  Pulse Rate (!) 115  ECG Heart Rate (!) 116  Resp 20  SpO2 95 %  O2 Device Room Air  Assess: MEWS Score  MEWS Temp 0  MEWS Systolic 0  MEWS Pulse 2  MEWS RR 0  MEWS LOC 0  MEWS Score 2  MEWS Score Color Yellow  Assess: if the MEWS score is Yellow or Red  Were vital signs accurate and taken at a resting state? Yes  Does the patient meet 2 or more of the SIRS criteria? No  Does the patient have a confirmed or suspected source of infection? Yes  MEWS guidelines implemented  No, previously yellow, continue vital signs every 4 hours  Assess: SIRS CRITERIA  SIRS Temperature  0  SIRS Respirations  0  SIRS Pulse 1  SIRS WBC 0  SIRS Score Sum  1

## 2024-01-19 NOTE — Progress Notes (Addendum)
 Pharmacy Antibiotic Note  [name] is a 63 y.o. male admitted on 01/14/2024 with UTI and intra-abdominal infection.  Pharmacy has been consulted for Unasyn  dosing.  Plan: Start Unasyn  3 grams IV Q 6 hours Discontinue Zosyn   Height: 5' 9 (175.3 cm) Weight: 110.7 kg (244 lb) IBW/kg (Calculated) : 70.7  Temp (24hrs), Avg:98 F (36.7 C), Min:97.7 F (36.5 C), Max:98.6 F (37 C)  Recent Labs  Lab 01/14/24 1357 01/14/24 1619 01/14/24 2000 01/15/24 0330 01/15/24 1849 01/16/24 0341 01/17/24 0801 01/18/24 0343 01/19/24 0433  WBC  --   --   --  15.0*  --  13.1* 15.2* 14.4* 11.1*  CREATININE  --   --   --  2.06*  --  1.99* 2.06* 1.84* 1.57*  LATICACIDVEN 5.5* 2.4* 1.4 1.2  --   --   --   --   --   VANCORANDOM  --   --   --   --  7  --   --   --   --     Estimated Creatinine Clearance: 58.3 mL/min (A) (by C-G formula based on SCr of 1.57 mg/dL (H)).    No Known Allergies  Antimicrobials this admission: 7/31 Zosyn  x 1 then 8/2 >> 8/5 7/31 metronidazole  >> 8/2 7/31 vancomycin  x1  Microbiology results: 7/31 BCx: ngtd 8/2 UCx: Enterococcus Faecalis (amp sensitive)  8/1 MRSA PCR: neg  Thank you for allowing pharmacy to be a part of this patient's care.  WENDI Amon Rocher, PharmD PGY-1 Pharmacy Resident  [facility] 01/19/2024 10:26 AM

## 2024-01-19 NOTE — Discharge Instructions (Signed)

## 2024-01-19 NOTE — Progress Notes (Signed)
 Physical Therapy Treatment Patient Details Name: [name] MRN: [id number] DOB: [birth year] Today's Date: 01/19/2024   History of Present Illness 63 y.o. male admitted 01/14/24 with abdominal pain after being found down at home. Workup for septic shock, AKI, appendicitis. S/p laparoscopic appendectomy with findings of peritoneal abscess and diffuse purulent peritonitis on 8/1. MD removed NGT 8/4. Nursing to start damp to dry dressing changes to midline wound 8/4; Pt still with JP drain and fecal mgmt system in place on 8/5. PMH includes L BKA (2022), R transmetatarsal amputation, HTN, DM, Schizophrenia.    PT Comments  Pt received in supine, pleasantly agreeable to therapy session and with good participation and tolerance for transfer training via lateral scoot OOB to drop-arm chair. Emphasis on seated balance, BLE exercises for strengthening and pressure relief technique while up in chair. Pt set up to brush his teeth per his request once up in chair, chair alarm on for pt safety. Drains and wound dressing appear c/d/I. Patient will benefit from continued inpatient follow up therapy, <3 hours/day, participating well this date.     If plan is discharge home, recommend the following: A lot of help with bathing/dressing/bathroom;Assistance with cooking/housework;Assist for transportation;Help with stairs or ramp for entrance;Two people to help with walking and/or transfers   Can travel by private vehicle     No  Equipment Recommendations  None recommended by PT    Recommendations for Other Services       Precautions / Restrictions Precautions Precautions: Fall;Other (comment) Recall of Precautions/Restrictions: Intact Precaution/Restrictions Comments: abdominal JP drain, fecal mgmt system Restrictions Weight Bearing Restrictions Per Provider Order: No     Mobility  Bed Mobility Overal bed mobility: Needs Assistance Bed Mobility: Rolling, Sidelying to Sit Rolling: Min assist, Used  rails Sidelying to sit: Used rails, Max assist       General bed mobility comments: cues for technique and heavy assist to elevate trunk from flat HOB; heavy reliance on bed rail; increased time/effort to perform; L and posterior lean upon sitting upright initially.    Transfers Overall transfer level: Needs assistance Equipment used: None (bed pad assist) Transfers: Bed to chair/wheelchair/BSC            Lateral/Scoot Transfers: Min assist, From elevated surface General transfer comment: to drop arm; pt would otherwise be unable to clear bottom over arm of chair without drop arm this session. Pt reports at baseline, he stands and pivots. Not safe to stand this session 2/2 lack of +2 assist and many lines.    Ambulation/Gait                   Stairs             Wheelchair Mobility     Tilt Bed    Modified Rankin (Stroke Patients Only)       Balance Overall balance assessment: Needs assistance Sitting-balance support: Bilateral upper extremity supported, Single extremity supported, Feet supported Sitting balance-Leahy Scale: Fair Sitting balance - Comments: initially reliant on UE support to maintain balance, progressing to no UE support after a couple mins       Standing balance comment: NT                            Communication Communication Communication: No apparent difficulties  Cognition Arousal: Alert Behavior During Therapy: Flat affect   PT - Cognitive impairments: No family/caregiver present to determine baseline  PT - Cognition Comments: WFL for majority of simple tasks. Following commands: Intact      Cueing Cueing Techniques: Verbal cues, Gestural cues  Exercises Other Exercises Other Exercises: seated RLE AROM: LAQ x10 reps, cues for 3-5 sec hold Other Exercises: encouraged BLE hip flexion/knee extension x10-20 reps TID while up in chair and chair push-ups as able Other Exercises:  chair push-ups x5 reps (EOB with fists pushing into mattress prior to OOB scooting)    General Comments General comments (skin integrity, edema, etc.): some drainage on towel around his fecal mgmt system that was there prior to session, towel/sheets changed once pt OOB in chair, RN notified. JP drain next to midline incision appears intact throughout. VSS on RA, no dizziness or nausea reported while pt seated.      Pertinent Vitals/Pain Pain Assessment Pain Assessment: Faces Faces Pain Scale: Hurts little more Pain Location: abdomen Pain Descriptors / Indicators: Sore, Guarding Pain Intervention(s): Limited activity within patient's tolerance, Monitored during session, Repositioned    Home Living                          Prior Function            PT Goals (current goals can now be found in the care plan section) Acute Rehab PT Goals Patient Stated Goal: willing to consider post-acute rehab if needed PT Goal Formulation: With patient Time For Goal Achievement: 01/30/24 Progress towards PT goals: Progressing toward goals    Frequency    Min 2X/week      PT Plan      Co-evaluation              AM-PAC PT 6 Clicks Mobility   Outcome Measure  Help needed turning from your back to your side while in a flat bed without using bedrails?: A Lot Help needed moving from lying on your back to sitting on the side of a flat bed without using bedrails?: A Lot Help needed moving to and from a bed to a chair (including a wheelchair)?: A Lot Help needed standing up from a chair using your arms (e.g., wheelchair or bedside chair)?: Total Help needed to walk in hospital room?: Total Help needed climbing 3-5 steps with a railing? : Total 6 Click Score: 9    End of Session Equipment Utilized During Treatment: Other (comment) (bed pad assist) Activity Tolerance: Patient tolerated treatment well;Patient limited by fatigue Patient left: in chair;with call bell/phone  within reach;with chair alarm set;Other (comment) (reclined, pillow to offload R heel, chair slightly reclined per patient request and set up to brush his teeth) Nurse Communication: Mobility status;Need for lift equipment;Precautions;Other (comment) (scoot to drop arm chair on his L side +2 for safety needed; vs Stedy for return to supine; pt needs new linens for his bed) PT Visit Diagnosis: Other abnormalities of gait and mobility (R26.89);Muscle weakness (generalized) (M62.81);Pain Pain - part of body:  (abdomen-wound dressing)     Time: 8445-8382 PT Time Calculation (min) (ACUTE ONLY): 23 min  Charges:    $Therapeutic Exercise: 8-22 mins $Therapeutic Activity: 8-22 mins PT General Charges $$ ACUTE PT VISIT: 1 Visit                     [name] P., PTA Acute Rehabilitation Services Secure Chat Preferred 9a-5:30pm Office: [phone]    Connell CHRISTELLA Blue 01/19/2024, 4:38 PM

## 2024-01-20 ENCOUNTER — Other Ambulatory Visit: Payer: Self-pay

## 2024-01-20 DIAGNOSIS — Z794 Long term (current) use of insulin: Secondary | ICD-10-CM

## 2024-01-20 DIAGNOSIS — I483 Typical atrial flutter: Secondary | ICD-10-CM

## 2024-01-20 DIAGNOSIS — Z89512 Acquired absence of left leg below knee: Secondary | ICD-10-CM

## 2024-01-20 DIAGNOSIS — E1152 Type 2 diabetes mellitus with diabetic peripheral angiopathy with gangrene: Secondary | ICD-10-CM

## 2024-01-20 LAB — COMPREHENSIVE METABOLIC PANEL WITH GFR
ALT: 22 U/L (ref 0–44)
AST: 19 U/L (ref 15–41)
Albumin: 1.9 g/dL — ABNORMAL LOW (ref 3.5–5.0)
Alkaline Phosphatase: 34 U/L — ABNORMAL LOW (ref 38–126)
Anion gap: 8 (ref 5–15)
BUN: 34 mg/dL — ABNORMAL HIGH (ref 8–23)
CO2: 24 mmol/L (ref 22–32)
Calcium: 7.9 mg/dL — ABNORMAL LOW (ref 8.9–10.3)
Chloride: 108 mmol/L (ref 98–111)
Creatinine, Ser: 1.65 mg/dL — ABNORMAL HIGH (ref 0.61–1.24)
GFR, Estimated: 46 mL/min — ABNORMAL LOW (ref >60)
Glucose, Bld: 217 mg/dL — ABNORMAL HIGH (ref 70–99)
Potassium: 3.3 mmol/L — ABNORMAL LOW (ref 3.5–5.1)
Sodium: 140 mmol/L (ref 135–145)
Total Bilirubin: 0.5 mg/dL (ref 0.0–1.2)
Total Protein: 5.6 g/dL — ABNORMAL LOW (ref 6.5–8.1)

## 2024-01-20 LAB — GLUCOSE, CAPILLARY
Glucose-Capillary: 196 mg/dL — ABNORMAL HIGH (ref 70–99)
Glucose-Capillary: 258 mg/dL — ABNORMAL HIGH (ref 70–99)
Glucose-Capillary: 183 mg/dL — ABNORMAL HIGH (ref 70–99)
Glucose-Capillary: 182 mg/dL — ABNORMAL HIGH (ref 70–99)

## 2024-01-20 LAB — MAGNESIUM: Magnesium: 1.9 mg/dL (ref 1.7–2.4)

## 2024-01-20 MED ORDER — APIXABAN 5 MG PO TABS
5.0000 mg | ORAL_TABLET | Freq: Two times a day (BID) | ORAL | Status: DC
  Administered 2024-01-20 – 2024-01-21 (×3): 5 mg via ORAL
  Filled 2024-01-20 (×3): qty 1

## 2024-01-20 MED ORDER — POTASSIUM CHLORIDE CRYS ER 20 MEQ PO TBCR
40.0000 meq | EXTENDED_RELEASE_TABLET | Freq: Two times a day (BID) | ORAL | Status: AC
  Administered 2024-01-20 (×2): 40 meq via ORAL
  Filled 2024-01-20 (×2): qty 2

## 2024-01-20 NOTE — Consult Note (Addendum)
 Cardiology Consultation   Patient ID: [name] MRN: [id number]; DOB: [birth year]  Admit date: 01/14/2024 Date of Consult: 01/20/2024  PCP:  Shelda Atlas, MD   [facility] Providers Cardiologist:  Jerel Balding, MD      Patient Profile: [name] is a 63 y.o. male with a hx of hypertension, type 2 diabetes, diabetic neuropathy, left below the knee amputation, multiple amputations of the right toes, and BPH who is being seen 01/20/2024 for the evaluation of atrial fibrillation at the request of Eben Ned, MD.  History of Present Illness: [name] is a 63 year old male who per chart review has not been seen by cardiology in the past.  Patient presented to the emergency department after being found down and complaining of right-sided abdominal pain, vomiting, and diarrhea.  The patient also developed sepsis, a fever, and an AKI. On 01/15/2024 the patient had a laparoscopic appendectomy.  During the appendectomy a peritoneal abscess and purulent peritonitis was found.  The patient was placed on IV antibiotics.  Postoperatively the send developed atrial fibrillation.  At that time it was felt like the atrial fibrillation was likely secondary to the patient's sepsis and appendicitis.  Started on 10 mg IV metoprolol .  The IV metoprolol  was stopped and was placed on metoprolol  tartrate 100 mg twice daily.  Was on heparin  for DVT prophylaxis but not on therapeutic dose for atrial fibrillation. On interview patient reported that he has no prior history of atrial fibrillation.  Denies any symptoms with the atrial fibrillation.  Denies any chest pain, shortness of breath, palpitations, fever, chills, diaphoresis, hematurea, hematochezia.   Has difficulty using prosthesis for left leg as the stump swells.  Is minimally active and gets around using a wheelchair.  Is unable to do more than 4 metabolic equivalents of exertion.  Drinks a 24 ounce can of alcohol  3-4 times a week.  Denies  tobacco use, cannabis use, and illicit substance use.  An echocardiogram was done on 01/16/24 and shows a normal LVEF of 55 to 60%, mild concentric LVH, G1 DD, normal RV function, and grossly normal valve function.  Chest x-ray on 01/14/2024 showed no active disease.  Hypokalemia with a potassium of 3.3, elevated BUN of 34, elevated creatinine of 1.65, GFR of 46 appears to have some baseline CKD, magnesium  of 1.9, hypoalbuminemia of 1.9, slight anemia with hemoglobin of 11.8.  Blood pressure is well-managed.  EKG on 01/16/2024 showed atrial fibrillation with a rate of 129.  Past Medical History:  Diagnosis Date   Blood transfusion 2009   Diabetes mellitus    type 2   Hyperlipemia    Hypertension    denies   Leg swelling    Osteomyelitis (HCC)    right 5th toe   Visual disturbance     Past Surgical History:  Procedure Laterality Date   AMPUTATION Left 2012   Left Great Toe   AMPUTATION Right 04/20/2015   Procedure: Right Great Toe Amputation;  Surgeon: Jerona Harden GAILS, MD;  Location: [facility] OR;  Service: Orthopedics;  Laterality: Right;   AMPUTATION Right 07/27/2015   Procedure: Right 2nd Toe Amputation;  Surgeon: Jerona Harden GAILS, MD;  Location: [facility] OR;  Service: Orthopedics;  Laterality: Right;   AMPUTATION Right 09/24/2016   Procedure: Right 5th Toe Amputation;  Surgeon: Jerona Harden GAILS, MD;  Location: [facility] OR;  Service: Orthopedics;  Laterality: Right;   AMPUTATION Right 01/07/2017   Procedure: Right Foot 3rd and 4th Toe Amputation;  Surgeon: Harden Jerona GAILS,  MD;  Location: [facility] OR;  Service: Orthopedics;  Laterality: Right;   AMPUTATION Left 11/30/2020   Procedure: LEFT BELOW KNEE AMPUTATION;  Surgeon: Harden Jerona GAILS, MD;  Location: [facility] OR;  Service: Orthopedics;  Laterality: Left;   CHOLECYSTECTOMY  03/2011   EYE SURGERY     lazer   foot surgery  11/2007   small toe on left foot removed.   LAPAROSCOPIC APPENDECTOMY  01/15/2024   Procedure: APPENDECTOMY, LAPAROSCOPIC;  Surgeon: Vanderbilt Ned, MD;   Location: [facility] OR;  Service: General;;   LAPAROSCOPY N/A 01/15/2024   Procedure: LAPAROSCOPY, DIAGNOSTIC, HAND ASSISTED;  Surgeon: Vanderbilt Ned, MD;  Location: [facility] OR;  Service: General;  Laterality: N/A;  POSSIBLE APPENDECTOMY   NASAL SEPTUM SURGERY  1981   Due to broken nose. Surgery was to repair how bone healed.   PARS PLANA VITRECTOMY  11/17/2011   Procedure: PARS PLANA VITRECTOMY WITH 23 GAUGE;  Surgeon: Jestine Bunnell, MD;  Location: [facility] OR;  Service: Ophthalmology;  Laterality: Right;  With Endolaser   TOE AMPUTATION Left    5 th   TONSILLECTOMY     TRANSURETHRAL RESECTION OF PROSTATE N/A 04/28/2022   Procedure: BIPOLAR TRANSURETHRAL RESECTION OF THE PROSTATE (TURP);  Surgeon: Selma Donnice SAUNDERS, MD;  Location: [facility] ORS;  Service: Urology;  Laterality: N/A;  90 MINUTES NEEDED FOR CASE     Home Medications:  Prior to Admission medications   Medication Sig Start Date End Date Taking? Authorizing Provider  aspirin  81 MG chewable tablet Chew 81 mg by mouth daily.   Yes [provider]  Cholecalciferol  (VITAMIN D ) 50 MCG (2000 UT) tablet Take 2,000 Units by mouth daily. 11/15/21  Yes [provider]  famotidine (PEPCID) 20 MG tablet Take 40 mg by mouth daily. 01/06/24  Yes [provider]  LANTUS  SOLOSTAR 100 UNIT/ML Solostar Pen Inject 20 Units into the skin at bedtime.  07/10/16  Yes [provider]  lisinopril  (ZESTRIL ) 10 MG tablet Take 10 mg by mouth daily. 01/06/24  Yes [provider]  OZEMPIC, 1 MG/DOSE, 4 MG/3ML SOPN Inject 1 mg into the skin once a week. 01/06/24  Yes [provider]  PRESCRIPTION MEDICATION **Novolog ** patient states that if blood sugar is above 150, he will inject 2-3 units of insulin  at that time.   Yes [provider]  RESTASIS 0.05 % ophthalmic emulsion Place 1 drop into both eyes daily. 01/06/24  Yes [provider]  simvastatin  (ZOCOR ) 10 MG tablet Take 10 mg by mouth at bedtime.    Yes [provider]  tamsulosin  (FLOMAX ) 0.4 MG CAPS capsule Take 0.4 mg by mouth daily. 11/14/20  Yes [provider]    Scheduled Meds:  heparin   5,000 Units Subcutaneous Q8H   insulin  aspart  0-15 Units Subcutaneous TID WC   insulin  aspart  0-5 Units Subcutaneous QHS   insulin  glargine-yfgn  10 Units Subcutaneous QHS   lisinopril   10 mg Oral Daily   metoprolol  tartrate  100 mg Oral BID   potassium chloride   40 mEq Oral BID   simvastatin   10 mg Oral QHS   tamsulosin   0.4 mg Oral Daily   Continuous Infusions:  ampicillin -sulbactam (UNASYN ) IV 3 g (01/20/24 1231)   PRN Meds: acetaminophen  **OR** acetaminophen , HYDROmorphone  (DILAUDID ) injection, ondansetron  (ZOFRAN ) IV, oxyCODONE   Allergies:   No Known Allergies  Social History:   Social History   Socioeconomic History   Marital status: Single    Spouse name: Not on file  Number of children: Not on file   Years of education: Not on file   Highest education level: Not on file  Occupational History   Not on file  Tobacco Use   Smoking status: Former    Current packs/day: 0.00    Types: Cigarettes    Start date: 03/24/1974    Quit date: 03/25/1979    Years since quitting: 44.8   Smokeless tobacco: Never  Vaping Use   Vaping status: Never Used  Substance and Sexual Activity   Alcohol  use: Yes    Comment: occasional  12 pk of malt liqour a month   Drug use: Not Currently   Sexual activity: Not on file  Other Topics Concern   Not on file  Social History Narrative   Not on file   Social Drivers of Health   Financial Resource Strain: Not on file  Food Insecurity: No Food Insecurity (01/16/2024)   Hunger Vital Sign    Worried About Running Out of Food in the Last Year: Never true    Ran Out of Food in the Last Year: Never true  Transportation Needs: Unmet Transportation Needs (01/16/2024)   PRAPARE - Administrator, Civil Service (Medical): Yes    Lack of Transportation (Non-Medical): Yes  Physical  Activity: Not on file  Stress: Not on file  Social Connections: Not on file  Intimate Partner Violence: Not At Risk (01/16/2024)   Humiliation, Afraid, Rape, and Kick questionnaire    Fear of Current or Ex-Partner: No    Emotionally Abused: No    Physically Abused: No    Sexually Abused: No    Family History:    Family History  Problem Relation Age of Onset   Cancer Mother        breast     ROS:  Please see the history of present illness.   All other ROS reviewed and negative.     Physical Exam/Data: Vitals:   01/19/24 2346 01/20/24 0356 01/20/24 0802 01/20/24 1137  BP: 124/72 112/67  101/70  Pulse: 84 81  83  Resp: 18 18  17   Temp: 98.4 F (36.9 C) 98.8 F (37.1 C) 98.4 F (36.9 C) 98.3 F (36.8 C)  TempSrc: Oral Oral Oral Oral  SpO2: 94% 97%  96%  Weight:      Height:        Intake/Output Summary (Last 24 hours) at 01/20/2024 1236 Last data filed at 01/20/2024 0918 Gross per 24 hour  Intake 426.93 ml  Output 355 ml  Net 71.93 ml      01/14/2024    4:34 PM 04/28/2022   11:55 AM 04/22/2022    1:23 PM  Last 3 Weights  Weight (lbs) 244 lb 249 lb 1.9 oz 250 lb  Weight (kg) 110.678 kg 113 kg 113.399 kg     Body mass index is 36.03 kg/m.  General:  Well nourished, well developed, in no acute distress, sitting up in chair during interview, on room air, alert and orientated. HEENT: normal Neck: no JVD Vascular: No carotid bruits; Distal pulses 1+ bilaterally Cardiac:  normal S1, S2; RRR; no murmur. Lungs:  clear to auscultation bilaterally, no wheezing, rhonchi or rales  Abd: soft, nontender, no hepatomegaly  Ext: no edema Musculoskeletal: Left below the knee amputation, right toes amputated. Skin: warm and dry  Neuro:  no focal abnormalities noted Psych:  Normal affect   EKG:  The EKG was personally reviewed and demonstrates: Atrial fibrillation with a rate of  129 Telemetry:  Telemetry was personally reviewed and demonstrates: Atrial flutter with heart rates  in the 80s to 110's.  Relevant CV Studies:  IMPRESSIONS     1. Left ventricular ejection fraction, by estimation, is 55 to 60%. The  left ventricle has normal function. The left ventricle has no regional  wall motion abnormalities. There is mild concentric left ventricular  hypertrophy. Left ventricular diastolic  parameters are consistent with Grade I diastolic dysfunction (impaired  relaxation).   2. Right ventricular systolic function is normal. The right ventricular  size is normal.   3. Left atrial size was mildly dilated.   4. The mitral valve is normal in structure. Trivial mitral valve  regurgitation.   5. The aortic valve is tricuspid. Aortic valve regurgitation is not  visualized. Aortic valve sclerosis is present, with no evidence of aortic  valve stenosis.   6. The inferior vena cava is normal in size with <50% respiratory  variability, suggesting right atrial pressure of 8 mmHg.   Conclusion(s)/Recommendation(s): Findings consistent with hypertrophic  cardiomyopathy.   Laboratory Data: High Sensitivity Troponin:  No results for input(s): TROPONINIHS in the last 720 hours.   Chemistry Recent Labs  Lab 01/17/24 0801 01/18/24 0343 01/19/24 0433 01/20/24 0318 01/20/24 0900  NA 146* 148* 145 140  --   K 3.8 3.6 3.5 3.3*  --   CL 112* 113* 111 108  --   CO2 25 24 25 24   --   GLUCOSE 193* 190* 189* 217*  --   BUN 39* 44* 39* 34*  --   CREATININE 2.06* 1.84* 1.57* 1.65*  --   CALCIUM 8.4* 8.5* 8.2* 7.9*  --   MG 2.0 2.0  --   --  1.9  GFRNONAA 35* 40* 49* 46*  --   ANIONGAP 9 11 9 8   --     Recent Labs  Lab 01/14/24 1342 01/20/24 0318  PROT 7.4 5.6*  ALBUMIN  3.1* 1.9*  AST 50* 19  ALT 21 22  ALKPHOS 63 34*  BILITOT 0.7 0.5   Lipids No results for input(s): CHOL, TRIG, HDL, LABVLDL, LDLCALC, CHOLHDL in the last 168 hours.  Hematology Recent Labs  Lab 01/17/24 0801 01/18/24 0343 01/19/24 0433  WBC 15.2* 14.4* 11.1*  RBC 5.16 4.83  4.73  HGB 12.9* 12.1* 11.8*  HCT 42.3 40.0 39.0  MCV 82.0 82.8 82.5  MCH 25.0* 25.1* 24.9*  MCHC 30.5 30.3 30.3  RDW 16.1* 16.1* 16.0*  PLT 290 289 300   Thyroid No results for input(s): TSH, FREET4 in the last 168 hours.  BNPNo results for input(s): BNP, PROBNP in the last 168 hours.  DDimer No results for input(s): DDIMER in the last 168 hours.  Radiology/Studies:  ECHOCARDIOGRAM COMPLETE Result Date: 01/16/2024    ECHOCARDIOGRAM REPORT   Patient Name:   [name] Date of Exam: 01/16/2024 Medical Rec #:  [id number]     Height:       69.0 in Accession #:    [id number]    Weight:       244.0 lb Date of Birth:  Aug 09, 1960     BSA:          2.248 m Patient Age:    63 years      BP:           154/60 mmHg Patient Gender: M             HR:           112  bpm. Exam Location:  Inpatient Procedure: 2D Echo, Cardiac Doppler, Color Doppler and Intracardiac            Opacification Agent (Both Spectral and Color Flow Doppler were            utilized during procedure). Indications:     I48.91* Unspecified atrial fibrillation  History:         Patient has no prior history of Echocardiogram examinations.                  Risk Factors:Hypertension, Diabetes and Dyslipidemia.  Sonographer:     Damien Senior RDCS Referring Phys:  [phone] THOMAS CORNETT Diagnosing Phys: Salena Negri MD IMPRESSIONS  1. Left ventricular ejection fraction, by estimation, is 55 to 60%. The left ventricle has normal function. The left ventricle has no regional wall motion abnormalities. There is mild concentric left ventricular hypertrophy. Left ventricular diastolic parameters are consistent with Grade I diastolic dysfunction (impaired relaxation).  2. Right ventricular systolic function is normal. The right ventricular size is normal.  3. Left atrial size was mildly dilated.  4. The mitral valve is normal in structure. Trivial mitral valve regurgitation.  5. The aortic valve is tricuspid. Aortic valve regurgitation is not visualized.  Aortic valve sclerosis is present, with no evidence of aortic valve stenosis.  6. The inferior vena cava is normal in size with <50% respiratory variability, suggesting right atrial pressure of 8 mmHg. Conclusion(s)/Recommendation(s): Findings consistent with hypertrophic cardiomyopathy. FINDINGS  Left Ventricle: Left ventricular ejection fraction, by estimation, is 55 to 60%. The left ventricle has normal function. The left ventricle has no regional wall motion abnormalities. Definity  contrast agent was given IV to delineate the left ventricular  endocardial borders. The left ventricular internal cavity size was normal in size. There is mild concentric left ventricular hypertrophy. Left ventricular diastolic parameters are consistent with Grade I diastolic dysfunction (impaired relaxation). Right Ventricle: The right ventricular size is normal. No increase in right ventricular wall thickness. Right ventricular systolic function is normal. Left Atrium: Left atrial size was mildly dilated. Right Atrium: Right atrial size was normal in size. Pericardium: There is no evidence of pericardial effusion. Mitral Valve: The mitral valve is normal in structure. Trivial mitral valve regurgitation. Tricuspid Valve: The tricuspid valve is normal in structure. Tricuspid valve regurgitation is not demonstrated. Aortic Valve: The aortic valve is tricuspid. Aortic valve regurgitation is not visualized. Aortic valve sclerosis is present, with no evidence of aortic valve stenosis. Pulmonic Valve: The pulmonic valve was normal in structure. Pulmonic valve regurgitation is mild. Aorta: The aortic root is normal in size and structure. Venous: The inferior vena cava is normal in size with less than 50% respiratory variability, suggesting right atrial pressure of 8 mmHg. IAS/Shunts: No atrial level shunt detected by color flow Doppler.  LEFT VENTRICLE PLAX 2D LVIDd:         4.30 cm LVIDs:         3.00 cm LV PW:         1.00 cm LV IVS:         1.20 cm LVOT diam:     2.20 cm LV SV:         41 LV SV Index:   18 LVOT Area:     3.80 cm  RIGHT VENTRICLE RV S prime:     13.60 cm/s TAPSE (M-mode): 1.5 cm LEFT ATRIUM           Index  RIGHT ATRIUM           Index LA diam:      3.40 cm 1.51 cm/m   RA Area:     11.50 cm LA Vol (A4C): 57.1 ml 25.40 ml/m  RA Volume:   23.20 ml  10.32 ml/m  AORTIC VALVE LVOT Vmax:   67.90 cm/s LVOT Vmean:  50.200 cm/s LVOT VTI:    0.107 m  AORTA Ao Root diam: 3.60 cm Ao Asc diam:  3.70 cm  SHUNTS Systemic VTI:  0.11 m Systemic Diam: 2.20 cm Salena Negri MD Electronically signed by Salena Negri MD Signature Date/Time: 01/16/2024/3:51:22 PM    Final      Assessment and Plan: [name] is a 63 y.o. male with a hx of hypertension, type 2 diabetes, diabetic neuropathy, left below the knee amputation, multiple amputations of the right toes, and BPH who is being seen 01/20/2024 for the evaluation of atrial fibrillation at the request of Eben Ned, MD.  Sepsis resolved Appendicitis resolved Peritoneal abscess Patient presented to the emergency department after being found down and complaining of right-sided abdominal pain, vomiting, and diarrhea.  The patient also developed sepsis, a fever, and an AKI. On 01/15/2024 the patient had a laparoscopic appendectomy.  During the appendectomy a peritoneal abscess and purulent peritonitis was found.  The patient was placed on IV antibiotics.  Surgery signed off on patient today  New onset atrial flutter CHA2DS2-VASc Score = 3 [CHF History: 0, HTN History: 1, Diabetes History: 1, Stroke History: 0, Vascular Disease History: 1, Age Score: 0, Gender Score: 0].  Therefore, the patient's annual risk of stroke is 3.2 %. In the setting of sepsis, appendicitis, and peritoneal abscess. Denies any prior history of atrial flutter.   Denies any symptoms with atrial flutter.  On telemetry remains in atrial flutter with heart rates in the 80s to 110s. Potassium 3.3, magnesium  of 1.9.   Already has potassium replacement ordered.  Ordered potassium replacement for goal potassium greater than 2.  Echocardiogram showed normal LVEF and mild atrial dilation. Ordered TSH for tomorrow Stop 5000 units subcutaneous heparin  as this is not a therapeutic dose for atrial fibrillation. Okay with surgery to start Eliquis  5 mg twice daily. Continue metoprolol  tartrate 100 mg twice daily. Plan for cardioversion after 3 weeks of uninterrupted OAC. With BMI of 36 recommend outpatient sleep study for OSA.   Aortic atherosclerosis Seen on CT abdomen and pelvis on 01/14/2024.  Increases CHA2DS2-VASc score   Hyperlipidemia  Type 2 diabetes Diabetic neuropathy Left below the knee amputation on 11/2020 Stop aspirin  81 mg daily as we are starting OAC. Continue simvastatin  10 mg daily   Hypertension Blood pressure is well-managed but are a bit labile.  Recent BP 101/70, prior to that was 112/67.  Denies any lightheadedness or dizziness. Continue lisinopril  10 mg daily Continue metoprolol  tartrate 100 mg twice daily   Otherwise managed per primary   Risk Assessment/Risk Scores:         CHA2DS2-VASc Score = 3   This indicates a 3.2% annual risk of stroke. The patient's score is based upon: CHF History: 0 HTN History: 1 Diabetes History: 1 Stroke History: 0 Vascular Disease History: 1 Age Score: 0 Gender Score: 0     [facility] will sign off.   The patient is ready for discharge today from a cardiac standpoint. Medication Recommendations:  As above. Other recommendations (labs, testing, etc):  sleep study for OSA. Follow up as an outpatient:  follow  up in afib clinic in 2-3 weeks  For questions or updates, please contact [facility] Please consult [email or web address] for contact info under    Signed, Morse Clause, PA-C  01/20/2024 12:36 PM  I have seen and examined the patient along with Zane Adams, PA-C .  I have reviewed the chart, notes and new data.  I  agree with PA/NP's note.  Key new complaints: unaware of palpitations. Loud snorer, but no clear daytime hypersomnolence. Key examination changes: severe/morbid obesity, irregular rhythm, no overt hypervolemia, s/p L BKA and multiple R toe amputations. Adequate rate control at rest (mostly 4:1 AV conduction, rate in the 80s) Key new findings / data: mild LVH and mild LA dilation, normal LVEF, RVEF and valves on echo. Right atrium is very poorly visualized.  ECG and telemetry suggests typical counterclockwise right atrial flutter STOP BANG score at least 6.  PLAN: While the current arrhythmic event may a unique episode related to the acute illness, it has been persisting for a while there are also reasons to believe that he has an underlying substrate for a recurrent arrhythmia. Sleep apnea is a strong consideration. Plan full anticoagulation, reevaluate in 3 weeks for DC cardioversion. Would not be very surprised if he converts spontaneously to normal rhythm, but if no interruption in anticoagulation, will plan DCCV. Regardless, would then continue full anticoagulation for at least several more months to a year, while we monitor for arrhythmia recurrence. Outpatient sleep study.  [facility] will sign off.   Medication Recommendations:   Eliquis  5 mg twice daily Metoprolol  tartrate 100 mg twice daily Stop ASA Other recommendations (labs, testing, etc):  OP sleep study. Follow up as an outpatient:  3 weeks AFib clinic.   Jerel Balding, MD, FACC [facility] [phone] 01/20/2024, 1:25 PM

## 2024-01-20 NOTE — Progress Notes (Signed)
JP drain removed per order

## 2024-01-20 NOTE — TOC Progression Note (Signed)
 Transition of Care [facility]) - Progression Note    Patient Details  Name: [name] MRN: [id number] Date of Birth: May 17, 1961  [facility]) CM/SW Contact  Montie LOISE Louder, [place] Phone Number: 01/20/2024, 2:30 PM  Clinical Narrative:     PASRR pending approval  Expected Discharge Plan: Skilled Nursing Facility Barriers to Discharge: Awaiting State Approval THEONE)               Expected Discharge Plan and Services       Living arrangements for the past 2 months: Apartment                                       Social Drivers of Health (SDOH) Interventions SDOH Screenings   Food Insecurity: No Food Insecurity (01/16/2024)  Housing: Low Risk  (01/16/2024)  Transportation Needs: Unmet Transportation Needs (01/16/2024)  Utilities: Not At Risk (01/16/2024)  Tobacco Use: Medium Risk (01/15/2024)    Readmission Risk Interventions     No data to display

## 2024-01-20 NOTE — Progress Notes (Signed)
 Mobility Specialist Progress Note:   01/20/24 1030  Mobility  Activity Pivoted/transferred from bed to chair  Level of Assistance Moderate assist, patient does 50-74% (+2)  Assistive Device Stedy  Activity Response Tolerated well  Mobility Referral Yes  Mobility visit 1 Mobility  Mobility Specialist Start Time (ACUTE ONLY) 1030  Mobility Specialist Stop Time (ACUTE ONLY) 1041  Mobility Specialist Time Calculation (min) (ACUTE ONLY) 11 min   Pt received in bed, agreeable to transfer B>C via Stedy. Required ModA+2 to stand. Tolerated well, asx throughout. Sitting up in chair with all needs met, call bell in reach.    Jesson Foskey Mobility Specialist Please contact via Special educational needs teacher or  Rehab office at [phone]

## 2024-01-20 NOTE — [place] FL2 (Signed)
 [place]  MEDICAID FL2 LEVEL OF CARE FORM     IDENTIFICATION  Patient Name: [name] Birthdate: 1961/05/17 Sex: male Admission Date (Current Location): 01/14/2024  [facility] and [place] Number:  Producer, television/film/video and Address:  [facility] [place], [address] [place], [place] [address]      Provider Number: [id number]  Attending Physician Name and Address:  Eben Reyes BROCKS, MD  Relative Name and Phone Number:  Gerri Maine (Sister)  [phone]    Current Level of Care: Hospital Recommended Level of Care: Skilled Nursing Facility Prior Approval Number:    Date Approved/Denied:   PASRR Number: [id number] Expired 01/11/2021  Discharge Plan: SNF    Current Diagnoses: Patient Active Problem List   Diagnosis Date Noted   Schizophrenia (HCC) 01/19/2024   Appendicitis with perforation 01/16/2024   Rhabdomyolysis 01/15/2024   Colitis 01/15/2024   Sepsis (HCC) 01/14/2024   AKI (acute kidney injury) (HCC) 01/14/2024   Acute appendicitis 01/14/2024   Type 2 diabetes mellitus with circulatory disorder (HCC) 01/14/2024   Hx of left BKA (HCC) 01/14/2024   Lactic acidosis 01/14/2024   BPH (benign prostatic hyperplasia) 04/28/2022   Subacute osteomyelitis, left ankle and foot (HCC) 11/30/2020   Abscess of bursa of left ankle 11/30/2020   Acute osteomyelitis of toe, right (HCC)    Diabetic polyneuropathy associated with type 2 diabetes mellitus (HCC) 08/14/2016   Chronic venous hypertension (idiopathic) with inflammation of bilateral lower extremity 08/14/2016   Ulcer of left heel, limited to breakdown of skin (HCC) 05/15/2016   Amputated toe of right foot (HCC) 07/27/2015   Great toe pain 06/17/2014   Type II diabetes mellitus with peripheral circulatory disorder (HCC) 06/17/2014   Elevated transaminase level 06/17/2014   HTN (hypertension) 06/17/2014   Visual disturbance     Orientation RESPIRATION BLADDER Height & Weight     Self, Time,  Situation, Place  Normal Incontinent, External catheter Weight: 244 lb (110.7 kg) Height:  5' 9 (175.3 cm)  BEHAVIORAL SYMPTOMS/MOOD NEUROLOGICAL BOWEL NUTRITION STATUS      Continent Diet (see DC summary)  AMBULATORY STATUS COMMUNICATION OF NEEDS Skin   Extensive Assist Verbally Other (Comment) (Closed System Drain 1 Right RUQ Bulb (JP) 19 Fr.    Wound 01/15/24 1152 Surgical Open Surgical Incision Abdomen Anterior;Medial. Surgical Laparoscopic. Wound 01/15/24 1211 Surgical Closed Surgical Incision Abdomen)                       Personal Care Assistance Level of Assistance  Bathing, Feeding, Dressing Bathing Assistance: Maximum assistance Feeding assistance: Limited assistance Dressing Assistance: Maximum assistance     Functional Limitations Info  Sight, Hearing, Speech Sight Info: Adequate Hearing Info: Adequate Speech Info: Adequate    SPECIAL CARE FACTORS FREQUENCY  PT (By licensed PT), OT (By licensed OT)     PT Frequency: 5x/week OT Frequency: 5x/week            Contractures Contractures Info: Not present    Additional Factors Info  Code Status, Allergies Code Status Info: FULL Allergies Info: NKA           Current Medications (01/20/2024):  This is the current hospital active medication list Current Facility-Administered Medications  Medication Dose Route Frequency Provider Last Rate Last Admin   acetaminophen  (TYLENOL ) tablet 650 mg  650 mg Oral Q6H PRN Cornett, Debby, MD   650 mg at 01/15/24 0156   Or   acetaminophen  (TYLENOL ) suppository 650 mg  650 mg Rectal  Q6H PRN Cornett, Debby, MD       Ampicillin -Sulbactam (UNASYN ) 3 g in sodium chloride  0.9 % 100 mL IVPB  3 g Intravenous Q6H Azadegan, Maryam, MD 200 mL/hr at 01/20/24 0523 3 g at 01/20/24 0523   heparin  injection 5,000 Units  5,000 Units Subcutaneous Q8H Cornett, Debby, MD   5,000 Units at 01/20/24 9473   HYDROmorphone  (DILAUDID ) injection 0.5-1 mg  0.5-1 mg Intravenous Q2H PRN Cornett,  Debby, MD       insulin  aspart (novoLOG ) injection 0-15 Units  0-15 Units Subcutaneous TID WC Cornett, Thomas, MD   3 Units at 01/20/24 9358   insulin  aspart (novoLOG ) injection 0-5 Units  0-5 Units Subcutaneous QHS Vanderbilt Debby, MD   2 Units at 01/19/24 2223   insulin  glargine-yfgn (SEMGLEE ) injection 10 Units  10 Units Subcutaneous QHS Tobie Gaines, DO   10 Units at 01/19/24 2223   lisinopril  (ZESTRIL ) tablet 10 mg  10 mg Oral Daily Azadegan, Maryam, MD   10 mg at 01/20/24 9140   metoprolol  tartrate (LOPRESSOR ) tablet 100 mg  100 mg Oral BID Tobie Gaines, DO   100 mg at 01/20/24 9140   ondansetron  (ZOFRAN ) injection 4 mg  4 mg Intravenous Q6H PRN Signe Cree A, MD   4 mg at 01/17/24 2215   oxyCODONE  (Oxy IR/ROXICODONE ) immediate release tablet 5 mg  5 mg Oral Q4H PRN Cornett, Debby, MD       potassium chloride  SA (KLOR-CON  M) CR tablet 40 mEq  40 mEq Oral BID Tobie Gaines, DO   40 mEq at 01/20/24 9140   simvastatin  (ZOCOR ) tablet 10 mg  10 mg Oral QHS Azadegan, Maryam, MD   10 mg at 01/19/24 2221   tamsulosin  (FLOMAX ) capsule 0.4 mg  0.4 mg Oral Daily Azadegan, Maryam, MD   0.4 mg at 01/20/24 9140     Discharge Medications: Please see discharge summary for a list of discharge medications.  Relevant Imaging Results:  Relevant Lab Results:   Additional Information SSN: [id number]  Montie LOISE Louder, LCSW

## 2024-01-20 NOTE — Progress Notes (Signed)
 HD#6 SUBJECTIVE:  Patient Summary:his is a 63 year old gentleman with a past medical history of diabetes, status post left BKA who presented to the emergency department with sepsis on perforated appendix.  Overnight Events: N/A Interim History: The patient reports no nausea, vomiting, or dysuria and is tolerating his diet well with minimal pain. He asked about discharge timing. He understands his appendix ruptured and recognizes it's a nonvital organ. PT recommends a one-month rehab stay to regain strength before going home. Discharge is planned before the weekend.  OBJECTIVE:  Vital Signs: Vitals:   01/19/24 2346 01/20/24 0356 01/20/24 0802 01/20/24 1137  BP: 124/72 112/67  101/70  Pulse: 84 81  83  Resp: 18 18  17   Temp: 98.4 F (36.9 C) 98.8 F (37.1 C) 98.4 F (36.9 C) 98.3 F (36.8 C)  TempSrc: Oral Oral Oral Oral  SpO2: 94% 97%  96%  Weight:      Height:       Supplemental O2: Room Air SpO2: 96 % O2 Flow Rate (L/min): 2 L/min  Filed Weights   01/14/24 1634  Weight: 110.7 kg     Intake/Output Summary (Last 24 hours) at 01/20/2024 1426 Last data filed at 01/20/2024 1300 Gross per 24 hour  Intake 666.93 ml  Output 355 ml  Net 311.93 ml   Net IO Since Admission: -4,853.23 mL [01/20/24 1426]  Physical Exam: Physical Exam Constitutional: Resting in bed, no acute distress Cardiovascular:regular rhythm, normal rate Pulmonary/Chest: normal work of breathing on room air, lungs clear to auscultation bilaterally Abdominal: soft, with 1 drain in place draining pink-tinged fluid, tape in place over the incisions Patient Lines/Drains/Airways Status     Active Line/Drains/Airways     Name Placement date Placement time Site Days   Peripheral IV 01/14/24 18 G 1.16 Anterior;Right Forearm 01/14/24  1340  Forearm  6   Peripheral IV 01/14/24 20 G 1 Anterior;Left Forearm 01/14/24  1635  Forearm  6   External Urinary Catheter 01/15/24  0100  --  5   Fecal Management System 35  mL 01/18/24  0830  -- 2   Wound 01/15/24 1152 Surgical Open Surgical Incision Abdomen Anterior;Medial 01/15/24  1152  Abdomen  5   Wound 01/15/24 1211 Surgical Closed Surgical Incision Abdomen 01/15/24  1211  Abdomen  5   Wound 01/15/24 1211 Surgical Laparoscopic 01/15/24  1211  --  5            Pertinent labs and imaging:      Latest Ref Rng & Units 01/19/2024    4:33 AM 01/18/2024    3:43 AM 01/17/2024    8:01 AM  CBC  WBC 4.0 - 10.5 K/uL 11.1  14.4  15.2   Hemoglobin 13.0 - 17.0 g/dL 88.1  87.8  87.0   Hematocrit 39.0 - 52.0 % 39.0  40.0  42.3   Platelets 150 - 400 K/uL 300  289  290        Latest Ref Rng & Units 01/20/2024    3:18 AM 01/19/2024    4:33 AM 01/18/2024    3:43 AM  CMP  Glucose 70 - 99 mg/dL 782  810  809   BUN 8 - 23 mg/dL 34  39  44   Creatinine 0.61 - 1.24 mg/dL 8.34  8.42  8.15   Sodium 135 - 145 mmol/L 140  145  148   Potassium 3.5 - 5.1 mmol/L 3.3  3.5  3.6   Chloride 98 - 111 mmol/L 108  111  113   CO2 22 - 32 mmol/L 24  25  24    Calcium 8.9 - 10.3 mg/dL 7.9  8.2  8.5   Total Protein 6.5 - 8.1 g/dL 5.6     Total Bilirubin 0.0 - 1.2 mg/dL 0.5     Alkaline Phos 38 - 126 U/L 34     AST 15 - 41 U/L 19     ALT 0 - 44 U/L 22       No results found.  ASSESSMENT/PLAN:  Assessment: Principal Problem:   Sepsis (HCC) Active Problems:   HTN (hypertension)   AKI (acute kidney injury) (HCC)   Acute appendicitis   Type 2 diabetes mellitus with circulatory disorder (HCC)   Hx of left BKA (HCC)   Lactic acidosis   Rhabdomyolysis   Colitis   Appendicitis with perforation   Schizophrenia (HCC)   Plan: This is a 63 year old gentleman with a past medical history of diabetes, status post left BKA who presented to the emergency department with sepsis on perforated appendix.    #Sepsis #Perforated appendix #UTI Patient is postop day 4.  He was found to have perforated appendix after diagnostic laparoscopy.  - Continue Unasyn  - Surgery following, appreciate  recommendations - Diet changed to soft, removed NG, Tolerating diet  - Monitor for further decompensation - Mobilize patient with PT/OT  # Paroxysmal atrial fibrillation Prior episodes of A-fib.  No acute concern for A-fib with RVR at this time. CHA2DS2-VASc score = 3; annual stroke risk ~3.2%. potassium 3.3 (replacement ongoing), magnesium  1.9. Echocardiogram: normal LVEF, mild atrial dilation. - cardio consulted -Ordered TSH for tomorrow. -Discontinue prophylactic heparin  (5000 units SQ); not therapeutic for atrial flutter. -start Eliquis  5 mg BID. -Continue metoprolol  tartrate 100 mg BID. -Plan cardioversion after >=3 weeks of uninterrupted anticoagulation. -outpatient sleep study for OSA, BMI 36 - metoprolol  100 mg twice daily - Monitor on telemetry # Diarrhea The patient's diarrhea may be multifactorial, possibly related to a post-abdominal infection, post-surgical ileus, or a side effect of antibiotic. - Has rectal tube and it was watery. -Continue IV fluid support to maintain hydration. - Change diet to regular #AKI versus CKD Improving. Creatinine has been stable at 1.65. Will continue with fluids.  - Continue IV fluids - Monitor BMP closely - Monitor urine output - Monitor for worsening renal function - Avoid nephrotoxic agents #Rhabdomyolysis, resolving Improving. CK is trending down to 2654, 2 days ago.  - Continue fluid resuscitation #Type 2 diabetes mellitus Glucose measuring between 176-189 -Continue sliding scale insulin    Diet: NPO IVF: LR,100cc/hr VTE: Heparin  Code: Full   Dispo: Anticipated discharge to Home in 3 days pending clinical improvement.   Signature:  Breyanna Valera [facility]  2:26 PM, 01/20/2024  On Call pager [phone]

## 2024-01-20 NOTE — Progress Notes (Signed)
 5 Days Post-Op   Subjective/Chief Complaint: No N/V. Tolerating soft diet. Continues to have BM and flatus. Pain well-controlled.  Objective: Vital signs in last 24 hours: Temp:  [98.2 F (36.8 C)-99 F (37.2 C)] 98.4 F (36.9 C) (08/06 0802) Pulse Rate:  [81-114] 81 (08/06 0356) Resp:  [18-20] 18 (08/06 0356) BP: (98-124)/(62-88) 112/67 (08/06 0356) SpO2:  [94 %-98 %] 97 % (08/06 0356) Last BM Date : 01/18/24  Intake/Output from previous day: 08/05 0701 - 08/06 0700 In: 186.9 [IV Piggyback:186.9] Out: 755 [Urine:750; Drains:5] Intake/Output this shift: Total I/O In: 240 [P.O.:240] Out: -   Alert, no distress Unlabored respirations on RA  Abdomen is soft, minimally distended. Port sites clean and dry with staples. Midline wound is clean without unusual drainage. Drain output remains serous with minimal volume in drain.   Lab Results:  Recent Labs    01/18/24 0343 01/19/24 0433  WBC 14.4* 11.1*  HGB 12.1* 11.8*  HCT 40.0 39.0  PLT 289 300   BMET Recent Labs    01/19/24 0433 01/20/24 0318  NA 145 140  K 3.5 3.3*  CL 111 108  CO2 25 24  GLUCOSE 189* 217*  BUN 39* 34*  CREATININE 1.57* 1.65*  CALCIUM 8.2* 7.9*   PT/INR No results for input(s): LABPROT, INR in the last 72 hours.  ABG No results for input(s): PHART, HCO3 in the last 72 hours.  Invalid input(s): PCO2, PO2  Studies/Results: No results found.   Anti-infectives: Anti-infectives (From admission, onward)    Start     Dose/Rate Route Frequency Ordered Stop   01/19/24 1200  Ampicillin -Sulbactam (UNASYN ) 3 g in sodium chloride  0.9 % 100 mL IVPB        3 g 200 mL/hr over 30 Minutes Intravenous Every 6 hours 01/19/24 1029 01/20/24 2359   01/16/24 2000  piperacillin -tazobactam (ZOSYN ) IVPB 3.375 g  Status:  Discontinued        3.375 g 12.5 mL/hr over 240 Minutes Intravenous Every 8 hours 01/16/24 1307 01/19/24 1035   01/15/24 2030  vancomycin  (VANCOCIN ) IVPB 1000 mg/200 mL premix         1,000 mg 200 mL/hr over 60 Minutes Intravenous  Once 01/15/24 2018 01/15/24 2130   01/15/24 1000  cefTRIAXone  (ROCEPHIN ) 2 g in sodium chloride  0.9 % 100 mL IVPB  Status:  Discontinued        2 g 200 mL/hr over 30 Minutes Intravenous Every 24 hours 01/14/24 1626 01/16/24 1307   01/14/24 2200  metroNIDAZOLE  (FLAGYL ) IVPB 500 mg  Status:  Discontinued        500 mg 100 mL/hr over 60 Minutes Intravenous Every 12 hours 01/14/24 1626 01/16/24 1307   01/14/24 1657  vancomycin  variable dose per unstable renal function (pharmacist dosing)  Status:  Discontinued         Does not apply See admin instructions 01/14/24 1658 01/16/24 1307   01/14/24 1515  piperacillin -tazobactam (ZOSYN ) IVPB 3.375 g        3.375 g 100 mL/hr over 30 Minutes Intravenous STAT 01/14/24 1509 01/14/24 1641   01/14/24 1515  vancomycin  (VANCOREADY) IVPB 2000 mg/400 mL        2,000 mg 200 mL/hr over 120 Minutes Intravenous  Once 01/14/24 1511 01/14/24 1854       Assessment/Plan: POD 5- s/p Laparocsopic Appendectomy, 8/1, Dr. Vanderbilt  63 year old male with history of hypertension and diabetes, lower extremity amputations and wheelchair-bound who was found down after developing diffuse right-sided abdominal pain, vomiting and diarrhea  the night prior.  Presented in shock with fever, tachycardia, leukocytosis, lactic acidosis, AKI and was found to have appendicitis.  He was resuscitated and underwent Laparoscopic appendectomy with findings of peritoneal abscess and diffuse purulent peritonitis on 8/1 with Dr. Vanderbilt  - Removed NG 8/4. Tolerating diet  - Nursing to start damp to dry dressing changes to midline wound - Continue IV antibiotics until today, 8/6 - IV fluids reordered - Mobilize as able - Appreciate medical team management of co-morbidities.  - Patient stable for discharge to SNF from surgical standpoint.  - Plan to remove JP drain prior to D/C.   FEN- Advanced diet to regular.   VTE- SubQ heparin  ID-  Zosyn >>>8/6   LOS: 6 days    Eulah Hammonds, PA-C  01/20/2024

## 2024-01-20 NOTE — Progress Notes (Signed)
 Occupational Therapy Treatment Patient Details Name: [name] MRN: [id number] DOB: [birth year] Today's Date: 01/20/2024   History of present illness 63 y.o. male admitted 01/14/24 with abdominal pain after being found down at home. Workup for septic shock, AKI, appendicitis. S/p laparoscopic appendectomy with findings of peritoneal abscess and diffuse purulent peritonitis on 8/1. MD removed NGT 8/4. Nursing to start damp to dry dressing changes to midline wound 8/4; Pt still with JP drain and fecal mgmt system in place on 8/5. PMH includes L BKA (2022), R transmetatarsal amputation, HTN, DM, Schizophrenia.   OT comments  Pt progressing well towards goals. Progressed to completing bathing with mostly set up assist in sitting. Mod assist for bed level perineal LB bathing. Lateral scoot completed with CGA and cueing for sequencing. Noted leakage around fecal tube, but appears intact. RN notified. Continue to recommend <3 hours of skilled rehab daily to optimize independence levels. Will continue to follow acutely.       If plan is discharge home, recommend the following:  A little help with walking and/or transfers;A lot of help with bathing/dressing/bathroom;Assistance with cooking/housework;Assist for transportation;Help with stairs or ramp for entrance   Equipment Recommendations  None recommended by OT    Recommendations for Other Services      Precautions / Restrictions Precautions Precautions: Fall;Other (comment) Recall of Precautions/Restrictions: Intact Precaution/Restrictions Comments: abdominal JP drain, fecal mgmt system Restrictions Weight Bearing Restrictions Per Provider Order: No       Mobility Bed Mobility Overal bed mobility: Needs Assistance Bed Mobility: Rolling, Sit to Supine Rolling: Supervision, Used rails     Sit to supine: Min assist   General bed mobility comments: Min assist to return BLEs to bed    Transfers Overall transfer level: Needs  assistance Equipment used: None Transfers: Bed to chair/wheelchair/BSC            Lateral/Scoot Transfers: Contact guard assist General transfer comment: CGA for lateral scoots, with cueing for safety, deferred standing trials d/t fecal tube leakage     Balance Overall balance assessment: Needs assistance Sitting-balance support: Bilateral upper extremity supported, Single extremity supported, Feet supported Sitting balance-Leahy Scale: Good       ADL either performed or assessed with clinical judgement   ADL Overall ADL's : Needs assistance/impaired Eating/Feeding: Set up;Sitting       Upper Body Bathing: Set up;Sitting   Lower Body Bathing: Moderate assistance;Bed level Lower Body Bathing Details (indicate cue type and reason): Assist for perineal hygiene at bed level, pt unable to complete lateral leans from recliner to reach             Toileting- Clothing Manipulation and Hygiene: Total assistance;Bed level Toileting - Clothing Manipulation Details (indicate cue type and reason): Total assist for hygiene around fecal tube     Functional mobility during ADLs: Contact guard assist General ADL Comments: CGA for lateral scoots, limited standing trials d/t perineal hygiene    Extremity/Trunk Assessment Upper Extremity Assessment Upper Extremity Assessment: Generalized weakness   Lower Extremity Assessment Lower Extremity Assessment: Defer to PT evaluation        Vision   Vision Assessment?: No apparent visual deficits         Communication Communication Communication: No apparent difficulties   Cognition Arousal: Alert Behavior During Therapy: Flat affect Cognition: No family/caregiver present to determine baseline             OT - Cognition Comments: Likely close to baseline, increased time for problem solving  Following commands: Intact        Cueing   Cueing Techniques: Verbal cues, Gestural cues        General  Comments Leakage noted around fecal management system, assisted in hygiene from bed level. Tube still draining, RN notified    Pertinent Vitals/ Pain       Pain Assessment Pain Assessment: Faces Faces Pain Scale: Hurts little more Pain Location: abdomen Pain Descriptors / Indicators: Sore, Discomfort Pain Intervention(s): Monitored during session   Frequency  Min 2X/week        Progress Toward Goals  OT Goals(current goals can now be found in the care plan section)  Progress towards OT goals: Progressing toward goals  Acute Rehab OT Goals Patient Stated Goal: To get better OT Goal Formulation: With patient Time For Goal Achievement: 01/31/24 Potential to Achieve Goals: Good ADL Goals Pt Will Perform Lower Body Dressing: with contact guard assist;sit to/from stand;sitting/lateral leans Pt Will Transfer to Toilet: with contact guard assist;stand pivot transfer;squat pivot transfer Pt/caregiver will Perform Home Exercise Program: Both right and left upper extremity;Increased strength Additional ADL Goal #1: Pt will transfer to wheelchair and perform functional mobility with CGA or less  Plan         AM-PAC OT 6 Clicks Daily Activity     Outcome Measure   Help from another person eating meals?: None Help from another person taking care of personal grooming?: A Little Help from another person toileting, which includes using toliet, bedpan, or urinal?: Total Help from another person bathing (including washing, rinsing, drying)?: A Lot Help from another person to put on and taking off regular upper body clothing?: A Little Help from another person to put on and taking off regular lower body clothing?: A Lot 6 Click Score: 15    End of Session    OT Visit Diagnosis: Unsteadiness on feet (R26.81);Muscle weakness (generalized) (M62.81);Pain   Activity Tolerance Patient tolerated treatment well   Patient Left in bed;with call bell/phone within reach;with family/visitor  present   Nurse Communication Mobility status        Time: 1445-1521 OT Time Calculation (min): 36 min  Charges: OT General Charges $OT Visit: 1 Visit OT Treatments $Self Care/Home Management : 23-37 mins  Adrianne BROCKS, OT  Acute Rehabilitation Services Office [phone] Secure chat preferred   Adrianne GORMAN Savers 01/20/2024, 4:09 PM

## 2024-01-21 ENCOUNTER — Other Ambulatory Visit (HOSPITAL_COMMUNITY): Payer: Self-pay

## 2024-01-21 ENCOUNTER — Telehealth (HOSPITAL_COMMUNITY): Payer: Self-pay | Admitting: Pharmacy Technician

## 2024-01-21 LAB — CBC
HCT: 35.6 % — ABNORMAL LOW (ref 39.0–52.0)
Hemoglobin: 11 g/dL — ABNORMAL LOW (ref 13.0–17.0)
MCH: 25 pg — ABNORMAL LOW (ref 26.0–34.0)
MCHC: 30.9 g/dL (ref 30.0–36.0)
MCV: 80.9 fL (ref 80.0–100.0)
Platelets: 324 K/uL (ref 150–400)
RBC: 4.4 MIL/uL (ref 4.22–5.81)
RDW: 16 % — ABNORMAL HIGH (ref 11.5–15.5)
WBC: 14.8 K/uL — ABNORMAL HIGH (ref 4.0–10.5)
nRBC: 0 % (ref 0.0–0.2)

## 2024-01-21 LAB — BASIC METABOLIC PANEL WITH GFR
Anion gap: 8 (ref 5–15)
BUN: 30 mg/dL — ABNORMAL HIGH (ref 8–23)
CO2: 24 mmol/L (ref 22–32)
Calcium: 7.8 mg/dL — ABNORMAL LOW (ref 8.9–10.3)
Chloride: 108 mmol/L (ref 98–111)
Creatinine, Ser: 1.75 mg/dL — ABNORMAL HIGH (ref 0.61–1.24)
GFR, Estimated: 43 mL/min — ABNORMAL LOW (ref >60)
Glucose, Bld: 205 mg/dL — ABNORMAL HIGH (ref 70–99)
Potassium: 3.7 mmol/L (ref 3.5–5.1)
Sodium: 140 mmol/L (ref 135–145)

## 2024-01-21 LAB — TSH: TSH: 1.642 u[IU]/mL (ref 0.350–4.500)

## 2024-01-21 LAB — GLUCOSE, CAPILLARY
Glucose-Capillary: 185 mg/dL — ABNORMAL HIGH (ref 70–99)
Glucose-Capillary: 247 mg/dL — ABNORMAL HIGH (ref 70–99)

## 2024-01-21 MED ORDER — INSULIN GLARGINE-YFGN 100 UNIT/ML ~~LOC~~ SOLN
12.0000 [IU] | Freq: Every day | SUBCUTANEOUS | Status: DC
  Filled 2024-01-21: qty 0.12

## 2024-01-21 MED ORDER — METOPROLOL TARTRATE 100 MG PO TABS: 100.0000 mg | ORAL_TABLET | Freq: Two times a day (BID) | ORAL | Status: AC

## 2024-01-21 MED ORDER — LANTUS SOLOSTAR 100 UNIT/ML ~~LOC~~ SOPN: 12.0000 [IU] | PEN_INJECTOR | Freq: Every day | SUBCUTANEOUS | Status: AC

## 2024-01-21 MED ORDER — APIXABAN 5 MG PO TABS: 5.0000 mg | ORAL_TABLET | Freq: Two times a day (BID) | ORAL | Status: AC

## 2024-01-21 MED ORDER — ACETAMINOPHEN 325 MG PO TABS: 650.0000 mg | ORAL_TABLET | Freq: Four times a day (QID) | ORAL | Status: AC | PRN

## 2024-01-21 NOTE — TOC Progression Note (Signed)
 Transition of Care [facility]) - Progression Note    Patient Details  Name: [name] MRN: [id number] Date of Birth: 1960-06-26  Transition of [facility]) CM/SW Contact  Montie LOISE Louder, [place] Phone Number: 01/21/2024, 9:22 AM  Clinical Narrative:     Received PASRR # [id number] E good 8/6 to 9/5.   Expected Discharge Plan: Skilled Nursing Facility Barriers to Discharge: Insurance Authorization               Expected Discharge Plan and Services       Living arrangements for the past 2 months: Apartment                                       Social Drivers of Health (SDOH) Interventions SDOH Screenings   Food Insecurity: No Food Insecurity (01/16/2024)  Housing: Low Risk  (01/16/2024)  Transportation Needs: Unmet Transportation Needs (01/16/2024)  Utilities: Not At Risk (01/16/2024)  Tobacco Use: Medium Risk (01/15/2024)    Readmission Risk Interventions     No data to display

## 2024-01-21 NOTE — Progress Notes (Signed)
 6 Days Post-Op   Subjective/Chief Complaint: Patient continues to do well. He denies N/V. Tolerating diet. He continues to have BM and flatus. Pain well-controlled.  Objective: Vital signs in last 24 hours: Temp:  [97.8 F (36.6 C)-99.1 F (37.3 C)] 98.2 F (36.8 C) (08/07 0736) Pulse Rate:  [76-110] 92 (08/07 0736) Resp:  [15-21] 15 (08/07 0736) BP: (101-127)/(60-84) 118/68 (08/07 0736) SpO2:  [94 %-98 %] 96 % (08/07 0736) Last BM Date : 01/20/24  Intake/Output from previous day: 08/06 0701 - 08/07 0700 In: 1273.1 [P.O.:960; IV Piggyback:313.1] Out: 1995 [Urine:1450; Stool:545] Intake/Output this shift: No intake/output data recorded.  Alert, no distress Unlabored respirations on RA  Abdomen is soft, minimally distended. Port sites clean and dry with staples. Midline wound is clean without unusual drainage. JP drain removed yesterday, 8/6.  Lab Results:  Recent Labs    01/19/24 0433 01/21/24 0327  WBC 11.1* 14.8*  HGB 11.8* 11.0*  HCT 39.0 35.6*  PLT 300 324   BMET Recent Labs    01/20/24 0318 01/21/24 0327  NA 140 140  K 3.3* 3.7  CL 108 108  CO2 24 24  GLUCOSE 217* 205*  BUN 34* 30*  CREATININE 1.65* 1.75*  CALCIUM 7.9* 7.8*   PT/INR No results for input(s): LABPROT, INR in the last 72 hours.  ABG No results for input(s): PHART, HCO3 in the last 72 hours.  Invalid input(s): PCO2, PO2  Studies/Results: No results found.   Anti-infectives: Anti-infectives (From admission, onward)    Start     Dose/Rate Route Frequency Ordered Stop   01/19/24 1200  Ampicillin -Sulbactam (UNASYN ) 3 g in sodium chloride  0.9 % 100 mL IVPB        3 g 200 mL/hr over 30 Minutes Intravenous Every 6 hours 01/19/24 1029 01/20/24 1751   01/16/24 2000  piperacillin -tazobactam (ZOSYN ) IVPB 3.375 g  Status:  Discontinued        3.375 g 12.5 mL/hr over 240 Minutes Intravenous Every 8 hours 01/16/24 1307 01/19/24 1035   01/15/24 2030  vancomycin  (VANCOCIN ) IVPB  1000 mg/200 mL premix        1,000 mg 200 mL/hr over 60 Minutes Intravenous  Once 01/15/24 2018 01/15/24 2130   01/15/24 1000  cefTRIAXone  (ROCEPHIN ) 2 g in sodium chloride  0.9 % 100 mL IVPB  Status:  Discontinued        2 g 200 mL/hr over 30 Minutes Intravenous Every 24 hours 01/14/24 1626 01/16/24 1307   01/14/24 2200  metroNIDAZOLE  (FLAGYL ) IVPB 500 mg  Status:  Discontinued        500 mg 100 mL/hr over 60 Minutes Intravenous Every 12 hours 01/14/24 1626 01/16/24 1307   01/14/24 1657  vancomycin  variable dose per unstable renal function (pharmacist dosing)  Status:  Discontinued         Does not apply See admin instructions 01/14/24 1658 01/16/24 1307   01/14/24 1515  piperacillin -tazobactam (ZOSYN ) IVPB 3.375 g        3.375 g 100 mL/hr over 30 Minutes Intravenous STAT 01/14/24 1509 01/14/24 1641   01/14/24 1515  vancomycin  (VANCOREADY) IVPB 2000 mg/400 mL        2,000 mg 200 mL/hr over 120 Minutes Intravenous  Once 01/14/24 1511 01/14/24 1854       Assessment/Plan: POD 6 - s/p Laparocsopic Appendectomy, 8/1, Dr. Vanderbilt  63 year old male with history of hypertension and diabetes, lower extremity amputations and wheelchair-bound who was found down after developing diffuse right-sided abdominal pain, vomiting and diarrhea the  night prior.  Presented in shock with fever, tachycardia, leukocytosis, lactic acidosis, AKI and was found to have appendicitis.  He was resuscitated and underwent Laparoscopic appendectomy with findings of peritoneal abscess and diffuse purulent peritonitis on 8/1 with Dr. Vanderbilt  - JP drain removed 8/6.  - Tolerating diet  - Nursing to continue damp to dry dressing changes to midline wound - IV antibiotics ended 8/6 - IV fluids reordered - Mobilize as able - Appreciate medical team management of co-morbidities.  - Patient stable for discharge to SNF from surgical standpoint.   FEN- Carb modified    VTE- SubQ heparin  ID- Zosyn >>>8/6   LOS: 7 days     Eulah Hammonds, PA-C  01/21/2024

## 2024-01-21 NOTE — TOC Transition Note (Signed)
 Transition of Care [facility]) - Discharge Note   Patient Details  Name: [name] MRN: [id number] Date of Birth: 1961-04-17  Transition of Care [facility]) CM/SW Contact:  Montie LOISE Louder, LCSW Phone Number: 01/21/2024, 3:02 PM   Clinical Narrative:     Patient will Discharge to: [facility] Discharge Date: 01/21/24 Family Notified: Sister  Transport By: ROME  Per MD patient is ready for discharge. RN, patient, and facility notified of discharge. Discharge Summary sent to facility. RN given number for [phone]). Ambulance transport requested for patient.   Clinical Social Worker signing off.  Montie Louder, MSW, LCSW Clinical Social Worker     Final next level of care: Skilled Nursing Facility Barriers to Discharge: Barriers Resolved   Patient Goals and CMS Choice            Discharge Placement                Patient to be transferred to facility by: [facility]   Patient and family notified of of transfer: 01/21/24  Discharge Plan and Services Additional resources added to the After Visit Summary for                                       Social Drivers of Health (SDOH) Interventions SDOH Screenings   Food Insecurity: No Food Insecurity (01/16/2024)  Housing: Low Risk  (01/16/2024)  Transportation Needs: Unmet Transportation Needs (01/16/2024)  Utilities: Not At Risk (01/16/2024)  Tobacco Use: Medium Risk (01/15/2024)     Readmission Risk Interventions     No data to display

## 2024-01-21 NOTE — TOC Progression Note (Signed)
 Transition of Care [facility]) - Progression Note    Patient Details  Name: [name] MRN: [id number] Date of Birth: 1960/09/02  Transition of Care [facility]) CM/SW Contact  Montie LOISE Louder, [place] Phone Number: 01/21/2024, 8:54 AM  Clinical Narrative:     CSW met with patient and provided bed offers and Medicare.gov star ratings.  Patient chose [facility].  [employer] confirmed bed availability and started insurance authorization for SNF.  Montie Louder, MSW, LCSW Clinical Social Worker     Expected Discharge Plan: Skilled Nursing Facility Barriers to Discharge: Awaiting State Approval (PASRR)               Expected Discharge Plan and Services       Living arrangements for the past 2 months: Apartment                                       Social Drivers of Health (SDOH) Interventions SDOH Screenings   Food Insecurity: No Food Insecurity (01/16/2024)  Housing: Low Risk  (01/16/2024)  Transportation Needs: Unmet Transportation Needs (01/16/2024)  Utilities: Not At Risk (01/16/2024)  Tobacco Use: Medium Risk (01/15/2024)    Readmission Risk Interventions     No data to display

## 2024-01-21 NOTE — Telephone Encounter (Signed)
Patient Product/process development scientist completed.    The patient is insured through [facility] MEDICAID.     Ran test claim for Eliquis 5 mg and the current 30 day co-pay is $4.00.   This test claim was processed through [facility]- copay amounts may vary at other pharmacies due to pharmacy/plan contracts, or as the patient moves through the different stages of their insurance plan.     [name], CPHT Pharmacy Technician III Certified Patient Advocate [facility] Patient Advocate Team Direct Number: [phone]  Fax: [phone]

## 2024-01-21 NOTE — Discharge Summary (Addendum)
 Name: [name] MRN: [id number] DOB: 30-Aug-1960 63 y.o. PCP: Shelda Atlas, MD  Date of Admission: 01/14/2024  1:28 PM Date of Discharge: 01/21/2024 Attending Physician: Dr. Reyes Fenton  Discharge Diagnosis: 1. Principal Problem:   Sepsis (HCC) Active Problems:   HTN (hypertension)   AKI (acute kidney injury) (HCC)   Acute perforated appendicitis   Type 2 diabetes mellitus with circulatory disorder (HCC)   Hx of left BKA (HCC)   Lactic acidosis   Rhabdomyolysis   Colitis   Appendicitis with perforation   Schizophrenia [facility])    Discharge Medications: Allergies as of 01/21/2024   No Known Allergies      Medication List     STOP taking these medications    PRESCRIPTION MEDICATION       TAKE these medications    acetaminophen  325 MG tablet Commonly known as: TYLENOL  Take 2 tablets (650 mg total) by mouth every 6 (six) hours as needed for mild pain (pain score 1-3) or fever (or Fever >/= 101).   apixaban  5 MG Tabs tablet Commonly known as: ELIQUIS  Take 1 tablet (5 mg total) by mouth 2 (two) times daily.   famotidine 20 MG tablet Commonly known as: PEPCID Take 40 mg by mouth daily.   Lantus  SoloStar 100 UNIT/ML Solostar Pen Generic drug: insulin  glargine Inject 12 Units into the skin at bedtime. What changed: how much to take   lisinopril  10 MG tablet Commonly known as: ZESTRIL  Take 10 mg by mouth daily.   metoprolol  tartrate 100 MG tablet Commonly known as: LOPRESSOR  Take 1 tablet (100 mg total) by mouth 2 (two) times daily.   Ozempic (1 MG/DOSE) 4 MG/3ML Sopn Generic drug: Semaglutide (1 MG/DOSE) Inject 1 mg into the skin once a week.   Restasis 0.05 % ophthalmic emulsion Generic drug: cycloSPORINE Place 1 drop into both eyes daily.   simvastatin  10 MG tablet Commonly known as: ZOCOR  Take 10 mg by mouth at bedtime.   tamsulosin  0.4 MG Caps capsule Commonly known as: FLOMAX  Take 0.4 mg by mouth daily.   Vitamin D  50 MCG (2000 UT)  tablet Take 2,000 Units by mouth daily.               Discharge Care Instructions  (From admission, onward)           Start     Ordered   01/21/24 0000  Discharge wound care:       Comments: Wound care:  -Damp to dry dressing changes to midline abdominal wound  -Apply dressing  Every shift   01/21/24 1443            Disposition and follow-up:   [name] was discharged from [facility] in Stable condition.  At the hospital follow up visit please address:  1.Monitor for signs of infection, including fever, nausea, vomiting, and diarrhea. Please also examine the surgical site for any signs of erythema, drainage, or dehiscence. 2.Assess for any episodes of palpitations or irregular heartbeat, given the patient's recent atrial fibrillation. 3.Review medication adherence, especially to newly initiated medications such as metoprolol  and Eliquis . 4.Continue physical therapy and mobility support as the patient recovers from surgery and hospitalization. 5.Labs / imaging needed at time of follow-up: CBC, CMP   Follow-up Appointments:  Follow-up Information     Maczis, Puja Gosai, PA-C. Go on 02/08/2024.   Specialty: General Surgery Why: 145pm. Please bring a copy of your photo ID, insurance card and arrive 30 minutes prior to your appointment  for paperwork. Contact information: [address] [place] [address] [facility] [place] [address] [phone]         Shelda Atlas, MD Follow up in 1 week(s).   Specialty: Internal Medicine Contact information: [address] [place] [address] [phone]         At[facility] at [facility] Follow up in 3 week(s).   Specialty: Cardiology Why: AFIB Contact information: [address] [place]  [address] [phone]        Sur[facility], [facility] Follow up.   Specialty: General Surgery Why:  10am. This is a nurse visit for staple removal. Please bring a copy of your photo ID, insurance card and arrive 30 minutes prior to your appointment for paperwork. Contact information: [address] [place] [address] [phone]                  Hospital Course by problem list:  #Sepsis secondary to perforated appendix The patient presented with clinical signs of sepsis: fever (T 101.56F), tachycardia (HR 122), leukocytosis (WBC 18.9), and elevated lactate, alongside a concerning abdominal exam. He had been found on the floor at home after a fall and endorsed abdominal pain, nausea, and fever. EMS reported dark, bloody emesis, though the patient denied vomiting. Imaging revealed a markedly dilated appendix (10 cm), bowel wall thickening, and mesenteric edema, highly suggestive of intra-abdominal infection, likely a perforated appendix. The sepsis code was activated. He was started on broad-spectrum antibiotics (vancomycin  and Zosyn , later transitioned to ceftriaxone  and metronidazole ), IV fluids, and admitted to the hospital. On hospital day 2, general surgery performed a diagnostic laparoscopy, which confirmed a perforated appendix with peritonitis and interloop abscesses. Post-operatively, the patient was kept NPO with an NG tube and continued on triple antibiotics. He gradually stabilized, with resolving fever and decreasing WBC count. By hospital day 6, he was afebrile, tolerating oral intake, and recovering well post-op. His antibiotics were narrowed to Unasyn , and his infection appeared to be resolving after completed course of antibiotics.  Slowly improved.  Discharge okay sepsis was attributed to the perforated appendicitis with secondary peritonitis and treated appropriately.  #AcuteKidneyInjury (#AKI) On admission, the patient had an elevated creatinine of 2.47, significantly above the upper limit of normal. His BUN was also elevated. This was suspected to be  multifactorial, due to sepsis-induced hypoperfusion, dehydration from prolonged downtime on the floor, and possibly early rhabdomyolysis. Aggressive fluid resuscitation was started with lactated Ringer 's at 125 mL/hr. His renal function slowly improved over the hospital course and stabilized at 1.57.  There was consideration of underlying chronic kidney disease (CKD) given the persistently elevated baseline and lack of prior creatinine values. Nephrotoxic agents were avoided, and strict monitoring of BMP and urine output was maintained. His renal function continued to improve without the need for renal replacement therapy.  #Rhabdomyolysis The patient's CK on admission was elevated (1755) and subsequently peaked at 4583, consistent with rhabdomyolysis, likely due to prolonged immobility from being down overnight after his fall. He was treated with aggressive IV fluids, and electrolytes were monitored closely. His CK trended downward over the course of his admission. By hospital day 4, CK had decreased to 2654 and was no longer being trended due to improvement. Renal function was monitored closely in the setting of rhabdomyolysis, with improvement seen alongside CK reduction. The condition resolved without complications such as electrolyte disturbances or worsening AKI.  #ParoxysmalAtrialFibrillation (#  PAF) The patient was found to have new-onset atrial fibrillation with rapid ventricular response (HR ~140s) on telemetry on hospital day 2, likely triggered by sepsis and volume shifts. He denied chest pain or palpitations. Exam revealed an irregularly irregular rhythm without signs of decompensation. Initially, antiarrhythmics were held to preserve cardiac output in the setting of sepsis. Once hemodynamically stable, he was started on metoprolol  IV every 6 hours, which successfully helped control the rate. By day 4, he was transitioned to metoprolol  tartrate 100 mg BID orally. Cardiology was consulted, and  his CHA2DS2-VASc score was 3, prompting initiation of Eliquis  5 mg BID for stroke prevention. Echocardiogram showed normal LVEF with mild left atrial dilation. Plans were made for cardioversion after 3 weeks of uninterrupted anticoagulation. TSH and sleep study were also ordered to evaluate underlying causes.  On discharge the patient is on Afib rate controlled rate  #Type2DiabetesMellitus (#DM2) The patient had a known history of DM2, managed at home with Lantus  20 units nightly, but this was held on admission due to acute illness and NPO status. He was started on a sliding scale insulin  regimen, and blood glucose levels ranged between 130 and 190 throughout hospitalization.  His glucose control remained stable without evidence of DKA or HHS. He tolerated the sliding scale well, and there were no hypoglycemic episodes. Plans for resuming home regimen with Lantus  at 12 units daily.    #Hypertension (#HTN) Home antihypertensives were held initially due to concerns for hypoperfusion and sepsis. Blood pressures remained stable throughout the admission. Once sepsis resolved and hemodynamics normalized, he was restarted on metoprolol  tartrate 100 mg BID and losartan.  #MechanicalFall The patient slipped out of his wheelchair at home and remained on the floor overnight. He denied head trauma or loss of consciousness. He was evaluated for trauma, with no acute injuries identified. The fall likely contributed to rhabdomyolysis, dehydration, and subsequent AKI. He was seen by PT/OT and began mobilization with assistance starting on hospital day 3.   #Hematemesis EMS reported the patient was found covered in dark bloody emesis. On initial evaluation, dried blood was noted in the mouth. However, the patient denied vomiting or GI symptoms. No ongoing bleeding was noted during admission. He was empirically started on Protonix  40 mg BID, and hematemesis did not recur.  #Diarrhea Beginning on hospital day 3, the  patient developed watery diarrhea, possibly due to post-op ileus, antibiotic-associated colitis (Zosyn ), or infection. A rectal tube was placed for management. Supportive treatment included IV fluids and electrolyte monitoring. His diet was gradually advanced from NPO ? clear liquids ? soft ? regular as tolerated. Zosyn  was later changed to Unasyn , which may have improved symptoms.  #UrinaryTractInfection (#UTI) Developed later in hospitalization, likely catheter-associated or related to sepsis. Diagnosed empirically and treated with Unasyn  after transition off Zosyn . No significant complications noted.  #ObstructiveSleepApnea (#OSA) (suspected) Due to obesity (BMI ~36) and new-onset atrial fibrillation, OSA was suspected. Outpatient sleep study was planned as part of follow-up to identify any contribution to his cardiovascular issues.   Stable chronic medical conditions: Schizophrenia   Subjective   Patient reports no nausea, vomiting, dysuria, or pain. Tolerating diet well. Denies headache, cough, shortness of breath, or pain at IV site. Reports adequate oral intake and hydration. No new complaints. States he feels okay overall. Sat in chair for several hours yesterday. Reports passing gas and having a couple of bowel movements, no visible diarrhea in rectal tube. Inquires about discharge timing and understands he will need to go to  a rehab facility for about one month before returning home. Acknowledges that the faster he regains strength, the sooner he can go home. Will speak with surgeon and social worker regarding discharge planning and bed placement.    Discharge Exam:   BP 111/61 (BP Location: Left Arm)   Pulse 81   Temp 97.7 F (36.5 C) (Oral)   Resp 20   Ht 5' 9 (1.753 m)   Wt 110.7 kg   SpO2 95%   BMI 36.03 kg/m  Constitutional: Resting in bed, no acute distress Cardiovascular:regular rhythm, normal rate Pulmonary/Chest: normal work of breathing on room air, lungs clear to  auscultation bilaterally Abdominal: soft without tenderness, tape in place over the incisions  Pertinent Labs, Studies, and Procedures:     Latest Ref Rng & Units 01/21/2024    3:27 AM 01/19/2024    4:33 AM 01/18/2024    3:43 AM  CBC  WBC 4.0 - 10.5 K/uL 14.8  11.1  14.4   Hemoglobin 13.0 - 17.0 g/dL 88.9  88.1  87.8   Hematocrit 39.0 - 52.0 % 35.6  39.0  40.0   Platelets 150 - 400 K/uL 324  300  289        Latest Ref Rng & Units 01/21/2024    3:27 AM 01/20/2024    3:18 AM 01/19/2024    4:33 AM  CMP  Glucose 70 - 99 mg/dL 794  782  810   BUN 8 - 23 mg/dL 30  34  39   Creatinine 0.61 - 1.24 mg/dL 8.24  8.34  8.42   Sodium 135 - 145 mmol/L 140  140  145   Potassium 3.5 - 5.1 mmol/L 3.7  3.3  3.5   Chloride 98 - 111 mmol/L 108  108  111   CO2 22 - 32 mmol/L 24  24  25    Calcium 8.9 - 10.3 mg/dL 7.8  7.9  8.2   Total Protein 6.5 - 8.1 g/dL  5.6    Total Bilirubin 0.0 - 1.2 mg/dL  0.5    Alkaline Phos 38 - 126 U/L  34    AST 15 - 41 U/L  19    ALT 0 - 44 U/L  22      X-ray abdomen AP Result Date: 01/15/2024 CLINICAL DATA:  Confirm nasogastric tube placement EXAM: ABDOMEN - 1 VIEW COMPARISON:  None Available. FINDINGS: The bowel gas pattern is normal. Enteric tube tip terminates in gastric fundus. Cholecystectomy clips in right upper quadrant. No radio-opaque calculi or other significant radiographic abnormality are seen. Degenerative changes of the spine. IMPRESSION: Proper position of enteric tube. Electronically Signed   By: Megan  Zare M.D.   On: 01/15/2024 13:06   CT ABDOMEN PELVIS WO CONTRAST Result Date: 01/14/2024 CLINICAL DATA:  Sepsis EXAM: CT ABDOMEN AND PELVIS WITHOUT CONTRAST TECHNIQUE: Multidetector CT imaging of the abdomen and pelvis was performed following the standard protocol without IV contrast. RADIATION DOSE REDUCTION: This exam was performed according to the departmental dose-optimization program which includes automated exposure control, adjustment of the mA and/or kV  according to patient size and/or use of iterative reconstruction technique. COMPARISON:  CT abdomen and pelvis 08/21/2023 FINDINGS: Lower chest: No acute abnormality. Hepatobiliary: No focal liver abnormality is seen. Status post cholecystectomy. No biliary dilatation. Pancreas: Unremarkable. No pancreatic ductal dilatation or surrounding inflammatory changes. Spleen: Normal in size without focal abnormality. Adrenals/Urinary Tract: There is mild nonspecific bilateral perinephric fat stranding. There is no hydronephrosis or urinary tract calculus.  The adrenal glands and bladder are within normal limits Stomach/Bowel: There is wall thickening of the cecum and ascending colon as well as terminal ileum. There are matted small bowel loops in the right lower quadrant with wall thickening and surrounding inflammation. There is dilated appendix measuring 10 cm image 6/23. There is marked mesenteric edema and interloop fluid in the right lower quadrant. This is not definitively centered surrounding the appendix. There is a questionable tiny focus of free air in the right lower quadrant adjacent to terminal ileum image 6/39. No evidence for bowel obstruction there is wall thickening of the distal esophagus. The stomach is within normal limits. Vascular/Lymphatic: Aorta and IVC are normal in size. There are atherosclerotic calcifications of the aorta. There are multiple nonenlarged retroperitoneal and central mesenteric lymph nodes. Reproductive: Prostate is unremarkable. Other: There is a small fat containing umbilical hernia. Musculoskeletal: No acute or significant osseous findings. IMPRESSION: 1. Marked inflammatory process in the right lower quadrant with wall thickening of the cecum, ascending colon, terminal ileum and matted small bowel loops. There is marked mesenteric edema and interloop fluid. There is a questionable tiny focus of free air in the right lower quadrant adjacent to the terminal ileum. Findings are  concerning for perforated viscus, possibly from the terminal ileum. 2. The appendix is dilated measuring 10 cm. Findings may be related to appendicitis or reactive change. 3. Wall thickening of the distal esophagus may represent esophagitis. 4. Aortic atherosclerosis. Aortic Atherosclerosis (ICD10-I70.0). Electronically Signed   By: Greig Pique M.D.   On: 01/14/2024 15:36   DG Chest Portable 1 View Result Date: 01/14/2024 CLINICAL DATA:  ?pna.  Unwitnessed fall.  Weakness. EXAM: PORTABLE CHEST 1 VIEW COMPARISON:  11/13/2011. FINDINGS: Please note portions of bilateral lung bases/lateral costophrenic angles is not included in the film. Low lung volume. Bilateral lung fields are clear. Bilateral costophrenic angles are clear. Normal cardio-mediastinal silhouette. No acute osseous abnormalities. The soft tissues are within normal limits. IMPRESSION: No active disease. Electronically Signed   By: Ree Molt M.D.   On: 01/14/2024 14:03     Discharge Instructions: Discharge Instructions     Call MD for:  difficulty breathing, headache or visual disturbances   Complete by: As directed    Call MD for:  persistant dizziness or light-headedness   Complete by: As directed    Call MD for:  persistant nausea and vomiting   Complete by: As directed    Call MD for:  redness, tenderness, or signs of infection (pain, swelling, redness, odor or green/yellow discharge around incision site)   Complete by: As directed    Call MD for:  severe uncontrolled pain   Complete by: As directed    Call MD for:  temperature >100.4   Complete by: As directed    Diet - low sodium heart healthy   Complete by: As directed    Discharge instructions   Complete by: As directed    Thank you for allowing us  to be part of your care. You were hospitalized for sepsis and perforated appendix. We treated you with antibiotics and you had surgery for removing appendix.  See the changes in your medications and management of your  chronic conditions below:  *For your  -We have STARTED you on these following medications:  - Metoprolol  100 twice daily  - Eliquis  5 mg twice daily  FOLLOW UP APPOINTMENTS: Please visit your PCP in 7 day your surgery appointment will be on 02/08/2024  Please make sure to :  1.Call your doctor if you have fever, chills, worsening abdominal pain, redness or drainage from your surgical site, nausea, vomiting, or diarrhea. 2.Take your medications exactly as prescribed. You are now on metoprolol  (for your heart rate) and Eliquis  (to prevent blood clots). Do not skip doses. Let your doctor know if you have any side effects like dizziness or unusual bleeding. 3.If you feel palpitations, racing heartbeat, lightheadedness or chest discomfort, contact your doctor or go to the ER. 4.Stay active with physical therapy. Moving around safely with help from PT will help you regain strength and go home sooner.  5.You'll need to follow up with your primary doctor, possibly cardiology, and your surgical team. These visits are important to check your healing and adjust medications if needed.  Please call your PCP or our clinic if you have any questions or concerns, we may be able to help and keep you from a long and expensive emergency room wait. Our clinic and after hours phone number is [phone]. The best time to call is Monday through Friday 9 am to 4 pm but there is always someone available 24/7 if you have an emergency. If you need medication refills please notify your pharmacy one week in advance and they will send us  a request.   We are glad you are feeling better,  Armando Rossetti Internal Medicine Inpatient Teaching Service at [facility]   Discharge wound care:   Complete by: As directed    Wound care:  -Damp to dry dressing changes to midline abdominal wound  -Apply dressing  Every shift   Increase activity slowly   Complete by: As directed        Signed: Rossetti Armando,  MD 01/21/2024, 2:43 PM

## 2024-01-21 NOTE — Progress Notes (Addendum)
 AVS in Packet for transport to [facility]  Reviewed AVS with patient answered questions  patient waiting in room for PTAR for transort

## 2024-01-21 NOTE — Progress Notes (Addendum)
 Mobility Specialist Progress Note:    01/21/24 1002  Mobility  Activity Pivoted/transferred from bed to chair  Level of Assistance Moderate assist, patient does 50-74% (+2)  Assistive Device  (bed pad)  Activity Response Tolerated well  Mobility Referral Yes  Mobility visit 1 Mobility  Mobility Specialist Start Time (ACUTE ONLY) 1002  Mobility Specialist Stop Time (ACUTE ONLY) 1010  Mobility Specialist Time Calculation (min) (ACUTE ONLY) 8 min   Pt received in bed, agreeable to mobility session. Scooted x4 with physical assistance. ModA+2 with bed pads to assist in scooting to drop down chair. Tolerated well, VSS. Left in chair all needs met. Returned pt back to chair after 2 hours, required [place] and ModA+2. Once returned to bed, pt's HR 217+ bpm. RN notified. All needs met.   Shantana Christon Mobility Specialist Please contact via Special educational needs teacher or  Rehab office at [phone]

## 2024-01-21 NOTE — Progress Notes (Signed)
 Mobility specialist notified that pt heart rate went up to 217, vitals taken pt remains in A  flutter ,Primary MD notified,12 Lead EKG done,pt remains asymptomatic heart rate came down to 80s

## 2024-01-21 NOTE — Progress Notes (Signed)
 Patient report given to Joy,RN of [facility],all questions were answered

## 2024-02-11 ENCOUNTER — Ambulatory Visit (HOSPITAL_COMMUNITY): Attending: Internal Medicine | Admitting: Internal Medicine

## 2024-02-11 ENCOUNTER — Encounter (HOSPITAL_COMMUNITY): Payer: Self-pay

## 2024-02-11 NOTE — Progress Notes (Incomplete)
 Primary Care Physician: Shelda Atlas, MD Primary Cardiologist: Jerel Balding, MD Electrophysiologist: None  {Click to update primary MD,subspecialty MD or APP then REFRESH:1}   Referring Physician: Dr. Balding  {Removed [facility], PMH, PSH, ALLERGY, CMED, and SOC :1}   [name] is a 63 y.o. male with a history of HTN, T2DM, aortic atherosclerosis by CT imaging, and atrial fibrillation who presents for consultation in the [facility]. Hospital admission 7/31-01/21/2024 for sepsis s/p lap cholecystectomy with peritonitis; AKI; new atrial flutter with RVR on hospital day 2. Started on Lopressor  100 mg BID. Patient is on Eliquis  for a CHADS2VASC score of 3.  On evaluation today, patient is currently in ***.   Today, he denies symptoms of ***palpitations, chest pain, shortness of breath, orthopnea, PND, lower extremity edema, dizziness, presyncope, syncope, snoring, daytime somnolence, bleeding, or neurologic sequela. The patient is tolerating medications without difficulties and is otherwise without complaint today.    Atrial Fibrillation Risk Factors:  he does have symptoms or diagnosis of sleep apnea.  he has a BMI of There is no height or weight on file to calculate BMI.. There were no vitals filed for this visit.  Current Outpatient Medications  Medication Sig Dispense Refill   acetaminophen  (TYLENOL ) 325 MG tablet Take 2 tablets (650 mg total) by mouth every 6 (six) hours as needed for mild pain (pain score 1-3) or fever (or Fever >/= 101).     apixaban  (ELIQUIS ) 5 MG TABS tablet Take 1 tablet (5 mg total) by mouth 2 (two) times daily.     Cholecalciferol  (VITAMIN D ) 50 MCG (2000 UT) tablet Take 2,000 Units by mouth daily.     famotidine (PEPCID) 20 MG tablet Take 40 mg by mouth daily.     LANTUS  SOLOSTAR 100 UNIT/ML Solostar Pen Inject 12 Units into the skin at bedtime.     lisinopril  (ZESTRIL ) 10 MG tablet Take 10 mg by mouth daily.     metoprolol   tartrate (LOPRESSOR ) 100 MG tablet Take 1 tablet (100 mg total) by mouth 2 (two) times daily.     OZEMPIC, 1 MG/DOSE, 4 MG/3ML SOPN Inject 1 mg into the skin once a week.     RESTASIS 0.05 % ophthalmic emulsion Place 1 drop into both eyes daily.     simvastatin  (ZOCOR ) 10 MG tablet Take 10 mg by mouth at bedtime.      tamsulosin  (FLOMAX ) 0.4 MG CAPS capsule Take 0.4 mg by mouth daily.     No current facility-administered medications for this visit.    Atrial Fibrillation Management history:  Previous antiarrhythmic drugs: none Previous cardioversions: none Previous ablations: none Anticoagulation history: Eliquis    ROS- All systems are reviewed and negative except as per the HPI above.  Physical Exam: There were no vitals taken for this visit.  GEN: Well nourished, well developed in no acute distress NECK: No JVD; No carotid bruits CARDIAC: {EPRHYTHM:28826}, no murmurs, rubs, gallops RESPIRATORY:  Clear to auscultation without rales, wheezing or rhonchi  ABDOMEN: Soft, non-tender, non-distended EXTREMITIES:  No edema; No deformity   EKG today demonstrates ***  Echo 01/16/24 demonstrated   1. Left ventricular ejection fraction, by estimation, is 55 to 60%. The  left ventricle has normal function. The left ventricle has no regional  wall motion abnormalities. There is mild concentric left ventricular  hypertrophy. Left ventricular diastolic  parameters are consistent with Grade I diastolic dysfunction (impaired  relaxation).   2. Right ventricular systolic function is normal. The right  ventricular  size is normal.   3. Left atrial size was mildly dilated.   4. The mitral valve is normal in structure. Trivial mitral valve  regurgitation.   5. The aortic valve is tricuspid. Aortic valve regurgitation is not  visualized. Aortic valve sclerosis is present, with no evidence of aortic  valve stenosis.   6. The inferior vena cava is normal in size with <50% respiratory   variability, suggesting right atrial pressure of 8 mmHg.   ASSESSMENT & PLAN CHA2DS2-VASc Score = 3  The patient's score is based upon: CHF History: 0 HTN History: 1 Diabetes History: 1 Stroke History: 0 Vascular Disease History: 1 Age Score: 0 Gender Score: 0   {Confirm score is correct.  If not, click here to update score.  REFRESH note.  :1}    ASSESSMENT AND PLAN: {Select the correct AFib Diagnosis                 :[id number]} Persistent if still in it  Afib/flutter    Follow up ***   Terra Pac, PA-C  [facility] [address] [place], [place] [address] [phone]
